# Patient Record
Sex: Female | Born: 1985 | Race: White | Hispanic: No | Marital: Married | Smoking: Former smoker
Health system: Southern US, Community
[De-identification: ages and names within clinical notes are randomized; demographics above are authoritative.]

## PROBLEM LIST (undated history)

## (undated) ENCOUNTER — Inpatient Hospital Stay (HOSPITAL_COMMUNITY): Payer: Self-pay

## (undated) DIAGNOSIS — O24419 Gestational diabetes mellitus in pregnancy, unspecified control: Secondary | ICD-10-CM

## (undated) DIAGNOSIS — D649 Anemia, unspecified: Secondary | ICD-10-CM

## (undated) DIAGNOSIS — F329 Major depressive disorder, single episode, unspecified: Secondary | ICD-10-CM

## (undated) DIAGNOSIS — F419 Anxiety disorder, unspecified: Secondary | ICD-10-CM

## (undated) DIAGNOSIS — Z87891 Personal history of nicotine dependence: Secondary | ICD-10-CM

## (undated) DIAGNOSIS — F1290 Cannabis use, unspecified, uncomplicated: Secondary | ICD-10-CM

## (undated) DIAGNOSIS — F32A Depression, unspecified: Secondary | ICD-10-CM

## (undated) HISTORY — DX: Anemia, unspecified: D64.9

## (undated) HISTORY — DX: Gestational diabetes mellitus in pregnancy, unspecified control: O24.419

## (undated) HISTORY — PX: OTHER SURGICAL HISTORY: SHX169

---
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
## 2007-06-16 ENCOUNTER — Emergency Department: Payer: Self-pay | Admitting: Emergency Medicine

## 2008-07-13 ENCOUNTER — Emergency Department (HOSPITAL_COMMUNITY): Admission: EM | Admit: 2008-07-13 | Discharge: 2008-07-13 | Payer: Self-pay | Admitting: Emergency Medicine

## 2009-02-13 HISTORY — PX: WISDOM TOOTH EXTRACTION: SHX21

## 2009-08-13 ENCOUNTER — Emergency Department: Payer: Self-pay | Admitting: Emergency Medicine

## 2011-06-29 ENCOUNTER — Encounter (HOSPITAL_COMMUNITY): Payer: Self-pay | Admitting: Emergency Medicine

## 2011-06-29 ENCOUNTER — Emergency Department (HOSPITAL_COMMUNITY)
Admission: EM | Admit: 2011-06-29 | Discharge: 2011-06-29 | Disposition: A | Discharge status: Home / Self Care | Payer: No Typology Code available for payment source | Attending: Emergency Medicine | Admitting: Emergency Medicine

## 2011-06-29 ENCOUNTER — Emergency Department (HOSPITAL_COMMUNITY): Payer: No Typology Code available for payment source

## 2011-06-29 DIAGNOSIS — R221 Localized swelling, mass and lump, neck: Secondary | ICD-10-CM | POA: Insufficient documentation

## 2011-06-29 DIAGNOSIS — R220 Localized swelling, mass and lump, head: Secondary | ICD-10-CM | POA: Insufficient documentation

## 2011-06-29 DIAGNOSIS — T07XXXA Unspecified multiple injuries, initial encounter: Secondary | ICD-10-CM | POA: Insufficient documentation

## 2011-06-29 DIAGNOSIS — M79609 Pain in unspecified limb: Secondary | ICD-10-CM | POA: Insufficient documentation

## 2011-06-29 DIAGNOSIS — F17200 Nicotine dependence, unspecified, uncomplicated: Secondary | ICD-10-CM | POA: Insufficient documentation

## 2011-06-29 DIAGNOSIS — IMO0002 Reserved for concepts with insufficient information to code with codable children: Secondary | ICD-10-CM | POA: Insufficient documentation

## 2011-06-29 DIAGNOSIS — M25559 Pain in unspecified hip: Secondary | ICD-10-CM | POA: Insufficient documentation

## 2011-06-29 DIAGNOSIS — S92309A Fracture of unspecified metatarsal bone(s), unspecified foot, initial encounter for closed fracture: Secondary | ICD-10-CM | POA: Insufficient documentation

## 2011-06-29 MED ORDER — OXYCODONE-ACETAMINOPHEN 5-325 MG PO TABS
2.0000 | ORAL_TABLET | Freq: Once | ORAL | Status: AC
  Administered 2011-06-29: 2 via ORAL
  Filled 2011-06-29: qty 2

## 2011-06-29 MED ORDER — OXYCODONE-ACETAMINOPHEN 5-325 MG PO TABS: 1.0000 | ORAL_TABLET | Freq: Four times a day (QID) | ORAL | Status: AC | PRN

## 2011-06-29 MED ORDER — BACITRACIN ZINC 500 UNIT/GM EX OINT
TOPICAL_OINTMENT | CUTANEOUS | Status: AC
  Filled 2011-06-29: qty 0.9

## 2011-06-29 NOTE — Discharge Instructions (Signed)
Assault, General Assault includes any behavior, whether intentional or reckless, which results in bodily injury to another person and/or damage to property. Included in this would be any behavior, intentional or reckless, that by its nature would be understood (interpreted) by a reasonable person as intent to harm another person or to damage his/her property. Threats may be oral or written. They may be communicated through regular mail, computer, fax, or phone. These threats may be direct or implied. FORMS OF ASSAULT INCLUDE:  Physically assaulting a person. This includes physical threats to inflict physical harm as well as:   Slapping.   Hitting.   Poking.   Kicking.   Punching.   Pushing.   Arson.   Sabotage.   Equipment vandalism.   Damaging or destroying property.   Throwing or hitting objects.   Displaying a weapon or an object that appears to be a weapon in a threatening manner.   Carrying a firearm of any kind.   Using a weapon to harm someone.   Using greater physical size/strength to intimidate another.   Making intimidating or threatening gestures.   Bullying.   Hazing.   Intimidating, threatening, hostile, or abusive language directed toward another person.   It communicates the intention to engage in violence against that person. And it leads a reasonable person to expect that violent behavior may occur.   Stalking another person.  IF IT HAPPENS AGAIN:  Immediately call for emergency help (911 in [place]   If someone poses clear and immediate danger to you, seek legal authorities to have a protective or restraining order put in place.   Less threatening assaults can at least be reported to authorities.  STEPS TO TAKE IF A SEXUAL ASSAULT HAS HAPPENED  Go to an area of safety. This may include a shelter or staying with a friend. Stay away from the area where you have been attacked. A large percentage of sexual assaults are caused by a friend, relative  or associate.   If medications were given by your caregiver, take them as directed for the full length of time prescribed.   Only take over-the-counter or prescription medicines for pain, discomfort, or fever as directed by your caregiver.   If you have come in contact with a sexual disease, find out if you are to be tested again. If your caregiver is concerned about the HIV/AIDS virus, he/she may require you to have continued testing for several months.   For the protection of your privacy, test results can not be given over the phone. Make sure you receive the results of your test. If your test results are not back during your visit, make an appointment with your caregiver to find out the results. Do not assume everything is normal if you have not heard from your caregiver or the medical facility. It is important for you to follow up on all of your test results.   File appropriate papers with authorities. This is important in all assaults, even if it has occurred in a family or by a friend.  SEEK MEDICAL CARE IF:  You have new problems because of your injuries.   You have problems that may be because of the medicine you are taking, such as:   Rash.   Itching.   Swelling.   Trouble breathing.   You develop belly (abdominal) pain, feel sick to your stomach (nausea) or are vomiting.   You begin to run a temperature.   You need supportive care or referral to  need supportive care or referral to a rape crisis center. These are centers with trained personnel who can help you get through this ordeal.  SEEK IMMEDIATE MEDICAL CARE IF:   You are afraid of being threatened, beaten, or abused. In [place] call 911.   You receive new injuries related to abuse.   You develop severe pain in any area injured in the assault or have any change in your condition that concerns you.   You faint or lose consciousness.   You develop chest pain or shortness of breath.  Document Released: 01/30/2005 Document Revised: 01/19/2011 Document Reviewed: 09/18/2007  [employer] Patient  Information 2012 [employer].
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
## 2011-06-30 NOTE — ED Provider Notes (Signed)
History/physical exam/procedure(s) were performed by non-physician practitioner and as supervising physician I was immediately available for consultation/collaboration. I have reviewed all notes and am in agreement with care and plan.   Raisha Brabender S Kyler Germer, MD 06/30/11 2316 

## 2013-06-04 ENCOUNTER — Emergency Department (HOSPITAL_COMMUNITY): Payer: No Typology Code available for payment source

## 2013-06-04 ENCOUNTER — Emergency Department (HOSPITAL_COMMUNITY)
Admission: EM | Admit: 2013-06-04 | Discharge: 2013-06-05 | Disposition: A | Discharge status: Home / Self Care | Payer: No Typology Code available for payment source | Attending: Emergency Medicine | Admitting: Emergency Medicine

## 2013-06-04 ENCOUNTER — Encounter (HOSPITAL_COMMUNITY): Payer: Self-pay | Admitting: Emergency Medicine

## 2013-06-04 DIAGNOSIS — R1310 Dysphagia, unspecified: Secondary | ICD-10-CM | POA: Insufficient documentation

## 2013-06-04 DIAGNOSIS — Z79899 Other long term (current) drug therapy: Secondary | ICD-10-CM | POA: Insufficient documentation

## 2013-06-04 DIAGNOSIS — F17200 Nicotine dependence, unspecified, uncomplicated: Secondary | ICD-10-CM | POA: Insufficient documentation

## 2013-06-04 NOTE — ED Notes (Signed)
Pt presents from home with c/o ?FB in esophagus, pt states she believes she swallowed chicken bone on 5 days a go. Pt c/o substernal pain. Pt is able to eat and drink.

## 2013-06-04 NOTE — ED Provider Notes (Signed)
CSN: [id number]     Arrival date & time 06/04/13  2209 History  This chart was scribed for non-physician practitioner Antony MaduraKelly Brandonn Capelli, PA-C working with Olivia Mackielga M Otter, MD by Joaquin MusicKristina Sanchez-Matthews, ED Scribe. This patient was seen in room WTR6/WTR6 and the patient's care was started at 11:48 PM .   Chief Complaint  Patient presents with  . Foreign Body   The history is provided by the patient. No language interpreter was used.   HPI Comments: Alexa White is a 28 y.o. female who presents to the Emergency Department complaining of chicken bone in esophagus she swallowed 5 days ago. Pt states she believes the bone  is small and thin but states she felt the bone going down her throat. She states she has been eating bread, rice, and drinking vinegar to "deteriorate the bone" to aid with her pain. Pt states she is having pain to swallow sporadically. Reports feeling the bone "coming up" when she bends forwards. Pt states in the morning, the bone is "at the top of her throat" and by the end of the day, the pain in mid-sternal. She reports being able to eat and drink fluids since she swallowed the bone. Denies having a PCP and taking OTC medications for her discomfort. Pt denies LOC, emesis, cough, sputum production, hemoptysis, inability to swallow, drooling, and fevers.   History reviewed. No pertinent past medical history. History reviewed. No pertinent past surgical history. No family history on file. History  Substance Use Topics  . Smoking status: Current Every Day Smoker  . Smokeless tobacco: Not on file  . Alcohol Use: Yes   OB History   Grav Para Term Preterm Abortions TAB SAB Ect Mult Living                 Review of Systems  Constitutional: Negative for fever.  HENT: Positive for trouble swallowing. Negative for drooling and sore throat.   Respiratory: Negative for cough and shortness of breath.   Gastrointestinal: Negative for vomiting and blood in stool.  Neurological: Negative  for syncope.  All other systems reviewed and are negative.  Allergies  Review of patient's allergies indicates no known allergies.  Home Medications   Prior to Admission medications   Medication Sig Start Date End Date Taking? Authorizing Provider  ibuprofen (ADVIL,MOTRIN) 200 MG tablet Take 400 mg by mouth every 6 (six) hours as needed (pain).    Yes Historical Provider, MD  norgestimate-ethinyl estradiol (ORTHO-CYCLEN,SPRINTEC,PREVIFEM) 0.25-35 MG-MCG tablet Take 1 tablet by mouth daily.   Yes Historical Provider, MD   BP 143/91  Pulse 76  Temp(Src) 97.8 F (36.6 C) (Oral)  Resp 16  Ht 5\' 4"  (1.626 m)  Wt 108 lb (48.988 kg)  BMI 18.53 kg/m2  SpO2 100%  LMP 05/13/2013  Physical Exam  Nursing note and vitals reviewed. Constitutional: She is oriented to person, place, and time. She appears well-developed and well-nourished. No distress.  Well and nontoxic appearing. Speaking in full sentences.  HENT:  Head: Normocephalic and atraumatic.  Mouth/Throat: Oropharynx is clear and moist. No oropharyngeal exudate.  Oropharynx clear. Uvula midline. No foreign body visualized. Patient tolerating secretions without difficulty or drooling.  Eyes: Conjunctivae and EOM are normal. Pupils are equal, round, and reactive to light. No scleral icterus.  Neck: Normal range of motion. Neck supple.  No stridor.  Cardiovascular: Normal rate, regular rhythm and normal heart sounds.   Pulmonary/Chest: Effort normal and breath sounds normal. No stridor. No respiratory distress. She has  no wheezes. She has no rales.  Chest expansion symmetric  Musculoskeletal: Normal range of motion.  Neurological: She is alert and oriented to person, place, and time.  Skin: Skin is warm and dry. No rash noted. She is not diaphoretic. No erythema. No pallor.  Psychiatric: She has a normal mood and affect. Her behavior is normal.    ED Course  Procedures DIAGNOSTIC STUDIES: Oxygen Saturation is 100% on RA, normal  by my interpretation.    COORDINATION OF CARE: 11:57 PM-Discussed treatment plan which includes speak with attending for further tx. Informed pt F/U with ENT may be necessary. Pt agreed to plan.  Labs Review Labs Reviewed - No data to display  Imaging Review Dg Chest 1 View  06/04/2013   CLINICAL DATA:  Chest pain. Patient ate a chicken bone 5 days ago and feels like it is still stuck.  EXAM: CHEST - 1 VIEW  COMPARISON:  None.  FINDINGS: The heart size and mediastinal contours are within normal limits. No mediastinal gas collections. No radiopaque foreign body. Both lungs are clear. The visualized skeletal structures are unremarkable.  IMPRESSION: No active disease.   Electronically Signed   By: Burman NievesWilliam  Stevens M.D.   On: 06/04/2013 22:34     EKG Interpretation None     MDM   Final diagnoses:  Dysphagia    28 year old presents for chief complaint of dysphasia. Patient states that symptoms have been waxing and waning sense of swallowing a chicken bone 5 days ago. Patient protecting her airway and speaking in full sentences. She denies inability to swallow food or fluids since the incident. She tolerates her secretions without difficulty or drooling today. Lungs clear to auscultation bilaterally. No stridor. No foreign body visualized on x-ray. Patient given GI cocktail in the ED. Will discharge with Pepcid and GI referral. Return precautions discussed and provided. Patient agreeable to plan with no unaddressed concerns.  I personally performed the services described in this documentation, which was scribed in my presence. The recorded information has been reviewed and is accurate.    Antony MaduraKelly Ranell Skibinski, PA-C 06/05/13 [phone]

## 2013-06-05 MED ORDER — FAMOTIDINE 20 MG PO TABS: 20.0000 mg | ORAL_TABLET | Freq: Two times a day (BID) | ORAL | Status: DC

## 2013-06-05 MED ORDER — GI COCKTAIL ~~LOC~~
30.0000 mL | Freq: Once | ORAL | Status: AC
  Administered 2013-06-05: 30 mL via ORAL
  Filled 2013-06-05: qty 30

## 2013-06-05 NOTE — ED Provider Notes (Signed)
Medical screening examination/treatment/procedure(s) were performed by non-physician practitioner and as supervising physician I was immediately available for consultation/collaboration.   EKG Interpretation None       Tonna Palazzi M Fumie Fiallo, MD 06/05/13 0559 

## 2013-06-05 NOTE — Discharge Instructions (Signed)
Dysphagia  Swallowing problems (dysphagia) occur when solids and liquids seem to stick in your throat on the way down to your stomach, or the food takes longer to get to the stomach. Other symptoms include regurgitating food, noises coming from the throat, chest discomfort with swallowing, and a feeling of fullness or the feeling of something being stuck in your throat when swallowing. When blockage in your throat is complete it may be associated with drooling.  CAUSES   Problems with swallowing may occur because of problems with the muscles. The food cannot be propelled in the usual manner into your stomach. You may have ulcers, scar tissue, or inflammation in the tube down which food travels from your mouth to your stomach (esophagus), which blocks food from passing normally into the stomach. Causes of inflammation include:  · Acid reflux from your stomach into your esophagus.  · Infection.  · Radiation treatment for cancer.  · Medicines taken without enough fluids to wash them down into your stomach.  You may have nerve problems that prevent signals from being sent to the muscles of your esophagus to contract and move your food down to your stomach. Globus pharyngeus is a relatively common problem in which there is a sense of an obstruction or difficulty in swallowing, without any physical abnormalities of the swallowing passages being found. This problem usually improves over time with reassurance and testing to rule out other causes.  DIAGNOSIS  Dysphagia can be diagnosed and its cause can be determined by tests in which you swallow a white substance that helps illuminate the inside of your throat (contrast medium) while X-rays are taken. Sometimes a flexible telescope that is inserted down your throat (endoscopy) to look at your esophagus and stomach is used.  TREATMENT   · If the dysphagia is caused by acid reflux or infection, medicines may be used.  · If the dysphagia is caused by problems with your  swallowing muscles, swallowing therapy may be used to help you strengthen your swallowing muscles.  · If the dysphagia is caused by a blockage or mass, procedures to remove the blockage may be done.  HOME CARE INSTRUCTIONS  · Try to eat soft food that is easier to swallow and check your weight on a daily basis to be sure that it is not decreasing.  · Be sure to drink liquids when sitting upright (not lying down).  SEEK MEDICAL CARE IF:  · You are losing weight because you are unable to swallow.  · You are coughing when you drink liquids (aspiration).  · You are coughing up partially digested food.  SEEK IMMEDIATE MEDICAL CARE IF:  · You are unable to swallow your own saliva .  · You are having shortness of breath or a fever, or both.  · You have a hoarse voice along with difficulty swallowing.  MAKE SURE YOU:  · Understand these instructions.  · Will watch your condition.  · Will get help right away if you are not doing well or get worse.  Document Released: 01/28/2000 Document Revised: 10/02/2012 Document Reviewed: 07/19/2012  [employer]® Patient Information ©2014 [employer].

## 2013-12-24 ENCOUNTER — Emergency Department (HOSPITAL_COMMUNITY)
Admission: EM | Admit: 2013-12-24 | Discharge: 2013-12-24 | Disposition: A | Discharge status: Home / Self Care | Payer: No Typology Code available for payment source | Attending: Emergency Medicine | Admitting: Emergency Medicine

## 2013-12-24 ENCOUNTER — Encounter (HOSPITAL_COMMUNITY): Payer: Self-pay | Admitting: *Deleted

## 2013-12-24 DIAGNOSIS — H9209 Otalgia, unspecified ear: Secondary | ICD-10-CM | POA: Insufficient documentation

## 2013-12-24 DIAGNOSIS — Z720 Tobacco use: Secondary | ICD-10-CM | POA: Insufficient documentation

## 2013-12-24 DIAGNOSIS — K088 Other specified disorders of teeth and supporting structures: Secondary | ICD-10-CM | POA: Insufficient documentation

## 2013-12-24 DIAGNOSIS — Z79899 Other long term (current) drug therapy: Secondary | ICD-10-CM | POA: Insufficient documentation

## 2013-12-24 DIAGNOSIS — K0889 Other specified disorders of teeth and supporting structures: Secondary | ICD-10-CM

## 2013-12-24 DIAGNOSIS — K029 Dental caries, unspecified: Secondary | ICD-10-CM | POA: Insufficient documentation

## 2013-12-24 MED ORDER — TRAMADOL HCL 50 MG PO TABS: 50.0000 mg | ORAL_TABLET | Freq: Four times a day (QID) | ORAL | Status: DC | PRN

## 2013-12-24 MED ORDER — NAPROXEN 500 MG PO TABS: 500.0000 mg | ORAL_TABLET | Freq: Two times a day (BID) | ORAL | Status: DC

## 2013-12-24 NOTE — ED Provider Notes (Signed)
CSN: [id number]     Arrival date & time 12/24/13  1652 History  This chart was scribed for Alexa CriglerJoshua Titus Drone, PA-C working with Mirian MoMatthew Gentry, MD by Evon Slackerrance Branch, ED Scribe. This patient was seen in room TR07C/TR07C and the patient's care was started at 4:59 PM.     Chief Complaint  Patient presents with  . Dental Pain   Patient is a 28 y.o. female presenting with tooth pain. The history is provided by the patient. No language interpreter was used.  Dental Pain Associated symptoms: no facial swelling, no fever, no headaches and no neck pain    HPI Comments: Alexa White is a 28 y.o. female who presents to the Emergency Department complaining of left lower side throbbing dental pain onset 6 months ago. She states that her pain recently worsened over the past month. She states that the pain intermittently shoots to her left ear. She states she has a chipped tooth that is gradually worsening as well. She states she has been taking ibuprofen and tylenol with no relief. She denies facial swelling.  History reviewed. No pertinent past medical history. History reviewed. No pertinent past surgical history. History reviewed. No pertinent family history. History  Substance Use Topics  . Smoking status: Current Every Day Smoker  . Smokeless tobacco: Not on file  . Alcohol Use: Yes   OB History    No data available     Review of Systems  Constitutional: Negative for fever.  HENT: Positive for dental problem and ear pain. Negative for facial swelling, sore throat and trouble swallowing.   Respiratory: Negative for shortness of breath and stridor.   Musculoskeletal: Negative for neck pain.  Skin: Negative for color change.  Neurological: Negative for headaches.    Allergies  Review of patient's allergies indicates no known allergies.  Home Medications   Prior to Admission medications   Medication Sig Start Date End Date Taking? Authorizing Provider  famotidine (PEPCID) 20 MG tablet  Take 1 tablet (20 mg total) by mouth 2 (two) times daily. 06/05/13   Antony MaduraKelly Humes, PA-C  ibuprofen (ADVIL,MOTRIN) 200 MG tablet Take 400 mg by mouth every 6 (six) hours as needed (pain).     Historical Provider, MD  norgestimate-ethinyl estradiol (ORTHO-CYCLEN,SPRINTEC,PREVIFEM) 0.25-35 MG-MCG tablet Take 1 tablet by mouth daily.    Historical Provider, MD   Triage Vitals: BP 143/81 mmHg  Pulse 91  Temp(Src) 98.6 F (37 C) (Oral)  Resp 20  SpO2 99%  LMP 12/09/2013  Physical Exam  Constitutional: She is oriented to person, place, and time. She appears well-developed and well-nourished. No distress.  HENT:  Head: Normocephalic and atraumatic.  Right Ear: Tympanic membrane, external ear and ear canal normal.  Left Ear: Tympanic membrane, external ear and ear canal normal.  Nose: Nose normal.  Mouth/Throat: Uvula is midline, oropharynx is clear and moist and mucous membranes are normal. No trismus in the jaw. Abnormal dentition. Dental caries present. No dental abscesses or uvula swelling. No tonsillar abscesses.  Patient with L mandibular tooth pain and tenderness to palpation in area of 1st molar. There is a hole in the tooth. No swelling or erythema noted on exam. No abscess noted.   Eyes: Conjunctivae and EOM are normal.  Neck: Normal range of motion. Neck supple. No tracheal deviation present.  No neck swelling or Ludwig's angina  Cardiovascular: Normal rate.   Pulmonary/Chest: Effort normal. No respiratory distress.  Musculoskeletal: Normal range of motion.  Lymphadenopathy:    She has no  cervical adenopathy.  Neurological: She is alert and oriented to person, place, and time.  Skin: Skin is warm and dry.  Psychiatric: She has a normal mood and affect. Her behavior is normal.  Nursing note and vitals reviewed.   ED Course  Procedures (including critical care time) DIAGNOSTIC STUDIES: Oxygen Saturation is 99% on RA, normal by my interpretation.    COORDINATION OF CARE: 5:03  PM-Discussed treatment plan which includes anti-inflammatories and pain medication with pt at bedside and pt agreed to plan.     Labs Review Labs Reviewed - No data to display  Imaging Review No results found.   EKG Interpretation None      5:11 PM Patient seen and examined.   Vital signs reviewed and are as follows: BP 107/87 mmHg  Pulse 83  Temp(Src) 98.2 F (36.8 C) (Oral)  Resp 14  SpO2 100%  LMP 12/09/2013  Patient counseled on use of narcotic pain medications. Counseled not to combine these medications with others containing tylenol. Urged not to drink alcohol, drive, or perform any other activities that requires focus while taking these medications. The patient verbalizes understanding and agrees with the plan.  Patient counseled to take prescribed medications as directed, return with worsening facial or neck swelling, and to follow-up with their dentist as soon as possible.   PCP/dental referrals given.   MDM   Final diagnoses:  Pain, dental   Patient with toothache. No fever. Exam unconcerning for Ludwig's angina or other deep tissue infection in neck.   As there is no facial swelling or gum findings, will not prescribe antibiotics at this time. Will treat with pain medication.      I personally performed the services described in this documentation, which was scribed in my presence. The recorded information has been reviewed and is accurate.     Alexa CriglerJoshua Anikin Prosser, PA-C 12/24/13 1711  Mirian MoMatthew Gentry, MD 12/25/13 2106

## 2013-12-24 NOTE — Discharge Instructions (Signed)
Please read and follow all provided instructions.  Your diagnoses today include:  1. Pain, dental    The exam and treatment you received today has been provided on an emergency basis only. This is not a substitute for complete medical or dental care.  Tests performed today include:  Vital signs. See below for your results today.   Medications prescribed:   Tramadol - narcotic-like pain medication  DO NOT drive or perform any activities that require you to be awake and alert because this medicine can make you drowsy.    Naproxen - anti-inflammatory pain medication  Do not exceed 500mg  naproxen every 12 hours, take with food  You have been prescribed an anti-inflammatory medication or NSAID. Take with food. Take smallest effective dose for the shortest duration needed for your pain. Stop taking if you experience stomach pain or vomiting.   Take any prescribed medications only as directed.  Home care instructions:  Follow any educational materials contained in this packet.  Follow-up instructions: Please follow-up with your dentist for further evaluation of your symptoms.   Dental Assistance: See below for dental referrals  Return instructions:   Please return to the Emergency Department if you experience worsening symptoms.  Please return if you develop a fever, you develop more swelling in your face or neck, you have trouble breathing or swallowing food.  Please return if you have any other emergent concerns.  Additional Information:  Your vital signs today were: BP 143/81 mmHg   Pulse 91   Temp(Src) 98.6 F (37 C) (Oral)   Resp 20   SpO2 99%   LMP 12/09/2013 If your blood pressure (BP) was elevated above 135/85 this visit, please have this repeated by your doctor within one month. -------------- Dental Care: Organization         Address  Phone  Notes  [facility] Department of [facility] [address] [employer] [place], [place] [phone]  Accepts children up to age 5 who are enrolled in [place] or [place] Health Choice; pregnant women with a Medicaid card; and children who have applied for Medicaid or [place] Health Choice, but were declined, whose parents can pay a reduced fee at time of service.  [facility] Department of [facility]  [address], [place] [phone] Accepts children up to age 74 who are enrolled in [place] or [place] Health Choice; pregnant women with a Medicaid card; and children who have applied for Medicaid or [place] Health Choice, but were declined, whose parents can pay a reduced fee at time of service.  [facility]  [address] [place], [place] [phone] Patients are seen by appointment only. Walk-ins are not accepted. [facility] will see patients 50 years of age and older. Monday - Tuesday (8am-5pm) Most Wednesdays (8:30-5pm) $30 per visit, cash only  [facility] Adult Dental Access PRO[facility]  [address], [facility] [phone] Patients are seen by appointment only. Walk-ins are not accepted. [facility] will see patients 68 years of age and older. One Wednesday Evening (Monthly: Volunteer Based).  $30 per visit, cash only  [employer]  [phone] for adults; Children under age 74, call Graduate Pediatric Dentistry at [phone]. Children aged 81-14, please call [phone] to request a pediatric application.  Dental services are provided in all areas of dental care including fillings, crowns and bridges, complete and partial dentures, implants, gum treatment, root canals, and extractions.  Preventive care is also provided. Treatment is provided to both adults and children. Patients are selected via a lottery and there is often a waiting list.   [facility] [address], [place]  [phone] [email or web address]   [facility] [address], [place], [place] [phone], Ext. [phone] Second  and Fourth Thursday of each month, opens at 6:30 AM; Clinic ends at 9 AM.  Patients are seen on a first-come first-served basis, and a limited number are seen during each clinic.   [facility]  [address], [place], [place] [phone]) [phone]   Eligibility Requirements You must have lived in [place], [place], or [place] counties for at least the last three months.   You cannot be eligible for state or federal sponsored [place]healthcare insurance, including CIG[employer]eterans Administration, [place]icaid, or [employer]icare.   You generally cannot be eligible for healthcare insurance through your employer.    How to apply: Eligibility screenings are held every Tuesday and Wednesday afternoon from 1:00 pm until 4:00 pm. You do not need an appointment for the interview!  [facility] [address], [place], [place] [phone]   [facility]  [phone]   [facility]  [phone]   [facility]  [phone]     Emergency Department Resource Guide 1) Find a Doctor and Pay Out of Pocket Although you won't have to find out who is covered by your insurance plan, it is a good idea to ask around and get recommendations. You will then need to call the office and see if the doctor you have chosen will accept you as a new patient and what types of options they offer for patients who are self-pay. Some doctors offer discounts or will set up payment plans for their patients who do not have insurance, but you will need to ask so you aren't surprised when you get to your appointment.  2) Contact Your Local Health Department Not all health departments have doctors that can see patients for sick visits, but many do, so it is worth a call to see if yours does. If you don't know where your local health department is, you can check in your phone book. The CDC also has a tool to help you locate your state's health department, and many  state websites also have listings of all of their local health departments.  3) Find a Walk-in Clinic If your illness is not likely to be very severe or complicated, you may want to try a walk in clinic. These are popping up all over the country in pharmacies, drugstores, and shopping centers. They're usually staffed by nurse practitioners or physician assistants that have been trained to treat common illnesses and complaints. They're usually fairly quick and inexpensive. However, if you have serious medical issues or chronic medical problems, these are probably not your best option.  No Primary Care Doctor: - Call Health Connect at  [phone] - they can help you locate a primary care doctor that  accepts your insurance, provides certain services, etc. - Physician Referral Service- [phone]  Chronic Pain Problems: Organization         Address  Phone   Notes  [facility]  [phone]) [phone] Patients need to be referred by their primary care doctor.   Medication Assistance: Organization         Address  Phone   Notes  [facility] [address] [place], [place] [address] [phone] --Must be a resident of [place] [facility] [facility] -- Must have NO insurance coverage whatsoever (no Medicaid/ Medicare, etc.) -- The pt. MUST have a primary care doctor that directs their care regularly and follows them in the community   MedAssist  [phone]   [employer]  [phone]    Agencies that provide inexpensive medical care: Organization         Address  Phone   Notes  [facility]  [phone]   [facility]    [phone]   [facility] [address] [place], [place] [address] [phone]) [phone]   [facility] [address], [place] [phone]   Planned Parenthood    [phone]   [facility]    [phone](336) [phone]   Community Health and  [facility]  [address], [place] Phone:  [phone](336) [phone], Fax:  [phone](336) [phone] Hours of Operation:  9 am - 6 pm, M-F.  Also accepts Medicaid/Medicare and self-pay.  [facility]  [address], [place] Phone: [phone], Fax: [phone]. Hours of Operation:  8:30 am - 5:30 pm, M-F.  Also accepts Medicaid and self-pay.  [facility] [place] [address], [place] Phone: [phone]) [phone]   [facility] [address], [place], [place] [phone], Ext. [phone] Mondays & Thursdays: 7-9 AM.  First 15 patients are seen on a first come, first serve basis.    Medicaid-accepting [facility] Providers:  Organization         Address  Phone   Notes  [facility] [address], [place] [phone]) [phone] Also accepts self-pay patients.  [facility] [address], [place]  [phone]) [phone]   [facility] [address], [place] [phone]   [facility] [address] [place] [address], [place] [phone]) [phone]   Renaye RakersVeita Bland [address], [place]   [phone] Only accepts [place] Access [place]icaid patients after they have their name applied to their card.   Self-Pay (no insurance) in [facility]:  Organization         Address  Phone   Notes  Sickle Cell Patients, [facility] [address], [place] [phone]   [facility] [address], [place] [phone]   [facility] [place]  [address], [place] [phone]   [facility]/Dr. Osei-Bonsu  [address] [place] [address], [place] or [phone], [place] [phone] Phone number for both [place] and [place] locations is the same.  [facility] [address], [place] [phone](336) [phone]   [facility] [address]  [place] [address], [place] or [address] [phone]   [facility] [address], [place] [phone]) [phone], phone; [phone]) [phone], fax Sees patients 1st and 3rd Saturday of every month.  Must not qualify for public or private insurance (i.e. Medicaid, Medicare, [place] Health Choice, Veterans' Benefits)  Household income should be no more than 200% of the poverty level The clinic cannot treat you if you are pregnant or think you are pregnant  Sexually transmitted diseases are not treated at the clinic.  Dental Care: Organization         Address  Phone  Notes  [facility] Department of [facility] [phone] Accepts children up to age 105 who are enrolled in [place] or [place]; pregnant women with a Medicaid card; and children who have applied for Medicaid or [place] Health Choice, but were declined, whose parents can pay a reduced fee at time of service.  [facility] Department of [facility]  [address], [place] [phone] Accepts children up to age 67 who are enrolled in [place] or [place]; pregnant women with a Medicaid card; and children who have applied for Medicaid or [place] Health Choice, but were declined, whose parents can pay a reduced fee at time of service.  [place] [facility]  [place] [phone] Patients are seen by appointment only. Walk-ins are not accepted. [place] will see patients 53 years of age and older. Monday - Tuesday (8am-5pm) Most Wednesdays (8:30-5pm) $30 per visit, cash only  [facility] Access [facility]  [address], [facility] [phone] Patients are seen by appointment only. Walk-ins are not accepted. [place] will see patients 77 years of age and older. One Wednesday Evening (Monthly: Volunteer Based).  $30 per visit, cash only  [place]  [phone] for adults; Children under age 101, call Graduate Pediatric Dentistry at [phone]. Children aged 28-14, please call [phone] to request a pediatric application.  Dental services are provided in all areas of dental care including fillings, crowns and bridges, complete and partial dentures, implants, gum treatment, root canals, and extractions. Preventive care is also provided. Treatment is provided to both adults and children. Patients are selected via a lottery and there is often a waiting list.   [facility] [address], [place]  [phone] [email or web address]   [facility] [address] [place], [place] [phone], Ext. [phone] Second and Fourth Thursday of each month, opens at 6:30 AM; Clinic ends at 9 AM.  Patients are seen on a first-come first-served basis, and a limited number are seen during each clinic.   [facility]  [address] [place] [address] Hillard Danker [place], [place] [phone]   Eligibility Requirements You must have lived in [place], [place], or [place] counties for at least the last three months.   You cannot be eligible for state or federal sponsored [employer], including [employer], [place], or [employer].   You generally cannot be eligible for healthcare insurance through your employer.    How to apply: Eligibility screenings are held every Tuesday and Wednesday afternoon from 1:00 pm until 4:00 pm. You do not need an appointment for the interview!  [facility] [address], [place], [place]   [place] Department  [name]  [phone]    Behavioral Health Resources in the Community: Intensive Outpatient Programs Organization         Address  Phone  Notes  [place] [address] [place], [place]  [phone]   [facility] [address], [place], [place]   [facility]: [facility] [address], [place], [place]   [place] [address],  [place], [place] [phone] or [phone]   Substance Abuse Resources Organization         Address  Phone  Notes  [facility] Drug Services  [phone]   [facility]  [phone]   [facility]  [phone]   Floydene Flock  [phone]   Residential & Outpatient Substance Abuse Program  [phone]   Psychological Services Organization         Address  Phone  Notes  [facility]  [phone]   [facility]  [phone]   [facility] [address], [place] [phone] or [phone]    Mobile Crisis Teams Organization         Address  Phone  Notes  [facility], Mobile Crisis Care Unit  [phone]   [facility]  [address] [place], [place] [phone]   Doristine Locks [address] [place] [phone]    Self-Help/Support Groups Organization         Address  Phone             Notes  [facility]ental Health Assoc. of [place] - variety of support groups  [phone]- I7437963 Call for more information  Narcotics Anonymous (NA), [facility] [address], [employer] [place]  2 meetings at this location   Statistician         Address  Phone  Notes  [facility] [address],    [place]  [phone]   [facility]  [address] [place] [address], [place], [place] [phone]   [facility] [address] [place], [place] [phone] Admissions: 8am-3pm M-F  [facility] [address]    [place], [place] [phone]   [facility] [address] [place], [place], [place] [phone]   [facility] [address]    [place], [place] [phone]   [facility] - Intensive Outpatient [address], [place], [place] [phone]   [facility] ([facility]iction Recovery [facility] [address] [place].,  [place], [place] [phone] or [phone]   [facility] (RTS) [address], [place], [place] [phone] Accepts Medicaid  [facility] [place] [address],  [place] [phone] Substance Abuse/Addiction Treatment   [facility] Organization         Address  Phone  Notes  [facility]  [phone]   Angie Fava, PhD [address] Kn[address] [place], [place]   [phone] or [phone]   [facility]   [address] [place], [place] [phone]   [facility] [address], [place], [place] [phone] Insurance/Medicaid/sponsorship through [facility] and Families [address]                                    [place], [place] [phone] Therapy/tele-psych/case  [facility] [address], [place] [phone]    Dr. Lolly Mustache  [phone]   [facility]  [employer] [facility] Dept. 1) [address] [place] 2) [address], [place] 3)  [address], [place] [phone]  [phone]   [facility] Child Abuse Hotline [phone]) [phone] or [phone] (After Hours)

## 2013-12-24 NOTE — ED Notes (Signed)
Pt reports broken tooth and pain to left lower side. Airway intact.

## 2014-02-13 DIAGNOSIS — F1290 Cannabis use, unspecified, uncomplicated: Secondary | ICD-10-CM

## 2014-02-13 HISTORY — DX: Cannabis use, unspecified, uncomplicated: F12.90

## 2014-02-13 NOTE — L&D Delivery Note (Cosign Needed)
Patient is 29 y.o. [id number] 1748w2d admitted after PROM and was induced with pitocin, hx of marijuana use.    Delivery Note At 8:16 PM a viable and healthy female was delivered via Vaginal, Spontaneous Delivery (Presentation: ; Occiput Anterior).  APGAR: , ; weight pending  .   Placenta status: Intact, Spontaneous.  Cord: 3 vessels with the following complications: None.    Anesthesia: Epidural  Episiotomy: None Lacerations:  1st degree perineal, no sutures Est. Blood Loss (mL):  100 cc  Mom to postpartum.  Baby to Couplet care / Skin to Skin.  Olivia Cantermanda Schultz 01/22/2015, 8:34 PM    Patient is a G3P1011 at 7948w2d who was admitted w/ PROM, uncomplicated prenatal course.  She progressed with augmentation via Pitocin.  Wynelle BourgeoisMarie Williams CNM was gloved and present for delivery in its entirety; this CNM present as well.  Second stage of labor progressed to SVD  Complications: none  Lacerations: see above    Cam HaiSHAW, KIMBERLY, CNM 12:30 AM  01/23/2015

## 2014-06-18 LAB — OB RESULTS CONSOLE HIV ANTIBODY (ROUTINE TESTING): HIV: NONREACTIVE

## 2014-06-19 LAB — OB RESULTS CONSOLE HGB/HCT, BLOOD: Hemoglobin: 10.6 g/dL

## 2014-06-19 LAB — OB RESULTS CONSOLE ANTIBODY SCREEN: Antibody Screen: NEGATIVE

## 2014-06-19 LAB — OB RESULTS CONSOLE ABO/RH: RH Type: POSITIVE

## 2014-06-19 LAB — OB RESULTS CONSOLE GC/CHLAMYDIA
CHLAMYDIA, DNA PROBE: NEGATIVE
Gonorrhea: NEGATIVE

## 2014-06-19 LAB — OB RESULTS CONSOLE RUBELLA ANTIBODY, IGM: RUBELLA: IMMUNE

## 2014-06-19 LAB — OB RESULTS CONSOLE RPR: RPR: NONREACTIVE

## 2014-06-19 LAB — OB RESULTS CONSOLE HEPATITIS B SURFACE ANTIGEN: Hepatitis B Surface Ag: NEGATIVE

## 2014-07-01 ENCOUNTER — Inpatient Hospital Stay (HOSPITAL_COMMUNITY)
Admission: AD | Admit: 2014-07-01 | Discharge: 2014-07-01 | Disposition: A | Discharge status: Home / Self Care | Payer: Medicaid Other | Source: Ambulatory Visit | Attending: Obstetrics & Gynecology | Admitting: Obstetrics & Gynecology

## 2014-07-01 ENCOUNTER — Encounter (HOSPITAL_COMMUNITY): Payer: Self-pay

## 2014-07-01 DIAGNOSIS — O210 Mild hyperemesis gravidarum: Secondary | ICD-10-CM | POA: Insufficient documentation

## 2014-07-01 DIAGNOSIS — B379 Candidiasis, unspecified: Secondary | ICD-10-CM

## 2014-07-01 DIAGNOSIS — B373 Candidiasis of vulva and vagina: Secondary | ICD-10-CM | POA: Diagnosis not present

## 2014-07-01 DIAGNOSIS — Z3A11 11 weeks gestation of pregnancy: Secondary | ICD-10-CM | POA: Diagnosis not present

## 2014-07-01 DIAGNOSIS — O98811 Other maternal infectious and parasitic diseases complicating pregnancy, first trimester: Secondary | ICD-10-CM | POA: Diagnosis not present

## 2014-07-01 DIAGNOSIS — Z87891 Personal history of nicotine dependence: Secondary | ICD-10-CM | POA: Diagnosis not present

## 2014-07-01 DIAGNOSIS — O219 Vomiting of pregnancy, unspecified: Secondary | ICD-10-CM

## 2014-07-01 LAB — URINALYSIS, ROUTINE W REFLEX MICROSCOPIC
BILIRUBIN URINE: NEGATIVE
GLUCOSE, UA: NEGATIVE mg/dL
KETONES UR: NEGATIVE mg/dL
Leukocytes, UA: NEGATIVE
Nitrite: NEGATIVE
PROTEIN: NEGATIVE mg/dL
Specific Gravity, Urine: >1.03 — ABNORMAL HIGH (ref 1.005–1.030)
Urobilinogen, UA: 1 mg/dL (ref 0.0–1.0)
pH: 5.5 (ref 5.0–8.0)

## 2014-07-01 LAB — URINE MICROSCOPIC-ADD ON

## 2014-07-01 LAB — WET PREP, GENITAL
Clue Cells Wet Prep HPF POC: NONE SEEN
Trich, Wet Prep: NONE SEEN

## 2014-07-01 LAB — POCT PREGNANCY, URINE: Preg Test, Ur: POSITIVE — AB

## 2014-07-01 MED ORDER — ONDANSETRON 8 MG PO TBDP
8.0000 mg | ORAL_TABLET | Freq: Once | ORAL | Status: AC
  Administered 2014-07-01: 8 mg via ORAL
  Filled 2014-07-01: qty 1

## 2014-07-01 MED ORDER — ONDANSETRON 8 MG PO TBDP: 8.0000 mg | ORAL_TABLET | Freq: Three times a day (TID) | ORAL | Status: DC | PRN

## 2014-07-01 MED ORDER — TERCONAZOLE 0.4 % VA CREA: 1.0000 | TOPICAL_CREAM | Freq: Every day | VAGINAL | Status: DC

## 2014-07-01 NOTE — MAU Provider Note (Signed)
History     CSN: [id number]  Arrival date and time: 07/01/14 2140   First Provider Initiated Contact with Patient 07/01/14 2244      Chief Complaint  Patient presents with  . Emesis During Pregnancy  . Vaginal Discharge  . Shortness of Breath   HPI Comments: Alexa White is a 29 y.o. G2P1001 at 11 weeks 0 days by LMP who presents today with nausea and vomiting. She has been taking phenergan, but states that in the last 24 hours she has not been able to keep anything down. She denies any abdominal pain or vaginal bleeding. She has had some vaginal discharge.   Vaginal Discharge The patient's primary symptoms include vaginal discharge. This is a new problem. The current episode started in the past 7 days. The problem has been gradually worsening. The patient is experiencing no pain. She is pregnant. Associated symptoms include nausea and vomiting. Pertinent negatives include no abdominal pain, constipation, diarrhea, dysuria, fever, frequency or urgency. The vaginal discharge was thick and white. She has not been passing clots. She has not been passing tissue.  Emesis  This is a recurrent problem. The current episode started 1 to 4 weeks ago. The problem occurs more than 10 times per day. The problem has been waxing and waning. The emesis has an appearance of stomach contents. There has been no fever. Pertinent negatives include no abdominal pain, diarrhea or fever. Risk factors: pregnancy  Treatments tried: phenergan     History reviewed. No pertinent past medical history.  History reviewed. No pertinent past surgical history.  History reviewed. No pertinent family history.  History  Substance Use Topics  . Smoking status: Former Smoker    Quit date: 06/01/2014  . Smokeless tobacco: Not on file  . Alcohol Use: No    Allergies: No Known Allergies  Prescriptions prior to admission  Medication Sig Dispense Refill Last Dose  . ibuprofen (ADVIL,MOTRIN) 200 MG tablet Take 400 mg  by mouth every 6 (six) hours as needed for headache (pain).    Past Week at Unknown time  . Prenatal Vit-Fe Fumarate-FA (PRENATAL MULTIVITAMIN) TABS tablet Take 1 tablet by mouth at bedtime.   Past Week at Unknown time  . promethazine (PHENERGAN) 25 MG tablet Take 12.5 mg by mouth 2 (two) times daily as needed for nausea or vomiting.   07/01/2014 at Unknown time  . famotidine (PEPCID) 20 MG tablet Take 1 tablet (20 mg total) by mouth 2 (two) times daily. (Patient not taking: Reported on 07/01/2014) 30 tablet 0 Not Taking at Unknown time  . naproxen (NAPROSYN) 500 MG tablet Take 1 tablet (500 mg total) by mouth 2 (two) times daily. (Patient not taking: Reported on 07/01/2014) 20 tablet 0 Not Taking at Unknown time  . traMADol (ULTRAM) 50 MG tablet Take 1 tablet (50 mg total) by mouth every 6 (six) hours as needed. (Patient not taking: Reported on 07/01/2014) 15 tablet 0 Not Taking at Unknown time    Review of Systems  Constitutional: Negative for fever.  Gastrointestinal: Positive for nausea and vomiting. Negative for abdominal pain, diarrhea and constipation.  Genitourinary: Positive for vaginal discharge. Negative for dysuria, urgency and frequency.   Physical Exam   Blood pressure 121/83, pulse 88, temperature 98.3 F (36.8 C), temperature source Oral, resp. rate 20, height 5' 3.5" (1.613 m), weight 47.537 kg (104 lb 12.8 oz).  Physical Exam  Nursing note and vitals reviewed. Constitutional: She is oriented to person, place, and time. She appears well-developed  and well-nourished. No distress.  Cardiovascular: Normal rate.   Respiratory: Effort normal.  GI: Soft. There is no tenderness. There is no rebound.  Neurological: She is alert and oriented to person, place, and time.  Skin: Skin is warm and dry.  Psychiatric: She has a normal mood and affect.  FHT 165 with doppler   Results for orders placed or performed during the hospital encounter of 07/01/14 (from the past 24 hour(s))   Urinalysis, Routine w reflex microscopic     Status: Abnormal   Collection Time: 07/01/14  9:45 PM  Result Value Ref Range   Color, Urine YELLOW YELLOW   APPearance CLEAR CLEAR   Specific Gravity, Urine >1.030 (H) 1.005 - 1.030   pH 5.5 5.0 - 8.0   Glucose, UA NEGATIVE NEGATIVE mg/dL   Hgb urine dipstick TRACE (A) NEGATIVE   Bilirubin Urine NEGATIVE NEGATIVE   Ketones, ur NEGATIVE NEGATIVE mg/dL   Protein, ur NEGATIVE NEGATIVE mg/dL   Urobilinogen, UA 1.0 0.0 - 1.0 mg/dL   Nitrite NEGATIVE NEGATIVE   Leukocytes, UA NEGATIVE NEGATIVE  Urine microscopic-add on     Status: Abnormal   Collection Time: 07/01/14  9:45 PM  Result Value Ref Range   Squamous Epithelial / LPF FEW (A) RARE   WBC, UA 0-2 <3 WBC/hpf   RBC / HPF 0-2 <3 RBC/hpf   Bacteria, UA RARE RARE   Urine-Other YEAST   Pregnancy, urine POC     Status: Abnormal   Collection Time: 07/01/14  9:59 PM  Result Value Ref Range   Preg Test, Ur POSITIVE (A) NEGATIVE  Wet prep, genital     Status: Abnormal   Collection Time: 07/01/14 10:15 PM  Result Value Ref Range   Yeast Wet Prep HPF POC MODERATE (A) NONE SEEN   Trich, Wet Prep NONE SEEN NONE SEEN   Clue Cells Wet Prep HPF POC NONE SEEN NONE SEEN   WBC, Wet Prep HPF POC FEW (A) NONE SEEN     MAU Course  Procedures  MDM   Assessment and Plan   1. Nausea/vomiting in pregnancy   2. Yeast infection    DC home RX zofran and terazol Return to MAU as needed  Follow-up Information    Follow up with [facility].   Why:  As scheduled   Contact information:   [address] [place] [id number] [phone]        Tawnya CrookHogan, Deanndra Kirley Donovan 07/01/2014, 10:58 PM

## 2014-07-01 NOTE — MAU Note (Signed)
Seen for a few visits at [facility]. Unable to keep phenergan or food down since this morning.  Denies vaginal bleeding. Vaginal discharge & irritation today. Lower abdominal pain; worse with movement and coughing.  Dizzy & SOB when moving and repositioning.

## 2014-07-01 NOTE — Discharge Instructions (Signed)
Morning Sickness Morning sickness is when you feel sick to your stomach (nauseous) during pregnancy. This nauseous feeling may or may not come with vomiting. It often occurs in the morning but can be a problem any time of day. Morning sickness is most common during the first trimester, but it may continue throughout pregnancy. While morning sickness is unpleasant, it is usually harmless unless you develop severe and continual vomiting (hyperemesis gravidarum). This condition requires more intense treatment.  CAUSES  The cause of morning sickness is not completely known but seems to be related to normal hormonal changes that occur in pregnancy. RISK FACTORS You are at greater risk if you:  Experienced nausea or vomiting before your pregnancy.  Had morning sickness during a previous pregnancy.  Are pregnant with more than one baby, such as twins. TREATMENT  Do not use any medicines (prescription, over-the-counter, or herbal) for morning sickness without first talking to your health care provider. Your health care provider may prescribe or recommend:  Vitamin B6 supplements.  Anti-nausea medicines.  The herbal medicine ginger. HOME CARE INSTRUCTIONS   Only take over-the-counter or prescription medicines as directed by your health care provider.  Taking multivitamins before getting pregnant can prevent or decrease the severity of morning sickness in most women.  Eat a piece of dry toast or unsalted crackers before getting out of bed in the morning.  Eat five or six small meals a day.  Eat dry and bland foods (rice, baked potato). Foods high in carbohydrates are often helpful.  Do not drink liquids with your meals. Drink liquids between meals.  Avoid greasy, fatty, and spicy foods.  Get someone to cook for you if the smell of any food causes nausea and vomiting.  If you feel nauseous after taking prenatal vitamins, take the vitamins at night or with a snack.  Snack on protein  foods (nuts, yogurt, cheese) between meals if you are hungry.  Eat unsweetened gelatins for desserts.  Wearing an acupressure wristband (worn for sea sickness) may be helpful.  Acupuncture may be helpful.  Do not smoke.  Get a humidifier to keep the air in your house free of odors.  Get plenty of fresh air. SEEK MEDICAL CARE IF:   Your home remedies are not working, and you need medicine.  You feel dizzy or lightheaded.  You are losing weight. SEEK IMMEDIATE MEDICAL CARE IF:   You have persistent and uncontrolled nausea and vomiting.  You pass out (faint). MAKE SURE YOU:  Understand these instructions.  Will watch your condition.  Will get help right away if you are not doing well or get worse. Document Released: 03/23/2006 Document Revised: 02/04/2013 Document Reviewed: 07/17/2012 [facility] Patient Information 2015 [place], [place]. This information is not intended to replace advice given to you by your health care provider. Make sure you discuss any questions you have with your health care provider.  Candida Infection A Candida infection (also called yeast, fungus, and Monilia infection) is an overgrowth of yeast that can occur anywhere on the body. A yeast infection commonly occurs in warm, moist body areas. Usually, the infection remains localized but can spread to become a systemic infection. A yeast infection may be a sign of a more severe disease such as diabetes, leukemia, or AIDS. A yeast infection can occur in both men and women. In women, Candida vaginitis is a vaginal infection. It is one of the most common causes of vaginitis. Men usually do not have symptoms or know they have an  infection until other problems develop. Men may find out they have a yeast infection because their sex partner has a yeast infection. Uncircumcised men are more likely to get a yeast infection than circumcised men. This is because the uncircumcised glans is not exposed to air and does not  remain as dry as that of a circumcised glans. Older adults may develop yeast infections around dentures. CAUSES  Women  Antibiotics.  Steroid medication taken for a long time.  Being overweight (obese).  Diabetes.  Poor immune condition.  Certain serious medical conditions.  Immune suppressive medications for organ transplant patients.  Chemotherapy.  Pregnancy.  Menstruation.  Stress and fatigue.  Intravenous drug use.  Oral contraceptives.  Wearing tight-fitting clothes in the crotch area.  Catching it from a sex partner who has a yeast infection.  Spermicide.  Intravenous, urinary, or other catheters. Men  Catching it from a sex partner who has a yeast infection.  Having oral or anal sex with a person who has the infection.  Spermicide.  Diabetes.  Antibiotics.  Poor immune system.  Medications that suppress the immune system.  Intravenous drug use.  Intravenous, urinary, or other catheters. SYMPTOMS  Women  Thick, white vaginal discharge.  Vaginal itching.  Redness and swelling in and around the vagina.  Irritation of the lips of the vagina and perineum.  Blisters on the vaginal lips and perineum.  Painful sexual intercourse.  Low blood sugar (hypoglycemia).  Painful urination.  Bladder infections.  Intestinal problems such as constipation, indigestion, bad breath, bloating, increase in gas, diarrhea, or loose stools. Men  Men may develop intestinal problems such as constipation, indigestion, bad breath, bloating, increase in gas, diarrhea, or loose stools.  Dry, cracked skin on the penis with itching or discomfort.  Jock itch.  Dry, flaky skin.  Athlete's foot.  Hypoglycemia. DIAGNOSIS  Women  A history and an exam are performed.  The discharge may be examined under a microscope.  A culture may be taken of the discharge. Men  A history and an exam are performed.  Any discharge from the penis or areas of  cracked skin will be looked at under the microscope and cultured.  Stool samples may be cultured. TREATMENT  Women  Vaginal antifungal suppositories and creams.  Medicated creams to decrease irritation and itching on the outside of the vagina.  Warm compresses to the perineal area to decrease swelling and discomfort.  Oral antifungal medications.  Medicated vaginal suppositories or cream for repeated or recurrent infections.  Wash and dry the irritation areas before applying the cream.  Eating yogurt with Lactobacillus may help with prevention and treatment.  Sometimes painting the vagina with gentian violet solution may help if creams and suppositories do not work. Men  Antifungal creams and oral antifungal medications.  Sometimes treatment must continue for 30 days after the symptoms go away to prevent recurrence. HOME CARE INSTRUCTIONS  Women  Use cotton underwear and avoid tight-fitting clothing.  Avoid colored, scented toilet paper and deodorant tampons or pads.  Do not douche.  Keep your diabetes under control.  Finish all the prescribed medications.  Keep your skin clean and dry.  Consume milk or yogurt with Lactobacillus-active culture regularly. If you get frequent yeast infections and think that is what the infection is, there are over-the-counter medications that you can get. If the infection does not show healing in 3 days, talk to your caregiver.  Tell your sex partner you have a yeast infection. Your partner may need  treatment also, especially if your infection does not clear up or recurs. Men  Keep your skin clean and dry.  Keep your diabetes under control.  Finish all prescribed medications.  Tell your sex partner that you have a yeast infection so he or she can be treated if necessary. SEEK MEDICAL CARE IF:   Your symptoms do not clear up or worsen in one week after treatment.  You have an oral temperature above 102 F (38.9 C).  You have  trouble swallowing or eating for a prolonged time.  You develop blisters on and around your vagina.  You develop vaginal bleeding and it is not your menstrual period.  You develop abdominal pain.  You develop intestinal problems as mentioned above.  You get weak or light-headed.  You have painful or increased urination.  You have pain during sexual intercourse. MAKE SURE YOU:   Understand these instructions.  Will watch your condition.  Will get help right away if you are not doing well or get worse. Document Released: 03/09/2004 Document Revised: 06/16/2013 Document Reviewed: 06/21/2009 [facility] Patient Information 2015 [place]Care, [place]. This information is not intended to replace advice given to you by your health care provider. Make sure you discuss any questions you have with your health care provider.

## 2014-12-28 ENCOUNTER — Ambulatory Visit (INDEPENDENT_AMBULATORY_CARE_PROVIDER_SITE_OTHER): Payer: Medicaid Other | Admitting: Obstetrics & Gynecology

## 2014-12-28 ENCOUNTER — Encounter: Payer: Self-pay | Admitting: Obstetrics & Gynecology

## 2014-12-28 VITALS — BP 112/75 | HR 118 | Wt 137.0 lb

## 2014-12-28 DIAGNOSIS — Z3483 Encounter for supervision of other normal pregnancy, third trimester: Secondary | ICD-10-CM

## 2014-12-28 NOTE — Progress Notes (Signed)
Subjective:  Alexa White is a 29 y.o. G3P1011 at 3969w5d being seen today for ongoing prenatal care. [place] [facility] [place] [facility] less than dates and tells me that they reassured her about the fetal growth.  Patient reports no complaints.  Contractions: Not present.  Vag. Bleeding: None. Movement: Present. Denies leaking of fluid.   The following portions of the patient's history were reviewed and updated as appropriate: allergies, current medications, past family history, past medical history, past social history, past surgical history and problem list. Problem list updated.  Objective:   Filed Vitals:   12/28/14 1448  BP: 112/75  Pulse: 118  Weight: 137 lb (62.143 kg)    Fetal Status: Fetal Heart Rate (bpm): 140   Movement: Present     General:  Alert, oriented and cooperative. Patient is in no acute distress.  Skin: Skin is warm and dry. No rash noted.   Cardiovascular: Normal heart rate noted  Respiratory: Normal respiratory effort, no problems with respiration noted  Abdomen: Soft, gravid, appropriate for gestational age. Pain/Pressure: Present     Pelvic: Vag. Bleeding: None Vag D/C Character: Thin   Cervical exam deferred        Extremities: Normal range of motion.  Edema: None  Mental Status: Normal mood and affect. Normal behavior. Normal judgment and thought content.   Urinalysis:      Assessment and Plan:  Pregnancy: G3P1011 at 3769w5d  1. Encounter for supervision of other normal pregnancy in third trimester  Term labor symptoms and general obstetric precautions including but not limited to vaginal bleeding, contractions, leaking of fluid and fetal movement were reviewed in detail with the patient. Please refer to After Visit Summary for other counseling recommendations.  Return in about 1 week (around 01/04/2015).   Allie BossierMyra C Idan Prime, MD

## 2014-12-29 ENCOUNTER — Encounter: Payer: Self-pay | Admitting: *Deleted

## 2015-01-02 ENCOUNTER — Inpatient Hospital Stay (HOSPITAL_COMMUNITY)
Admission: AD | Admit: 2015-01-02 | Discharge: 2015-01-03 | Disposition: A | Discharge status: Home / Self Care | Payer: Medicaid Other | Source: Ambulatory Visit | Attending: Obstetrics and Gynecology | Admitting: Obstetrics and Gynecology

## 2015-01-02 DIAGNOSIS — Z3493 Encounter for supervision of normal pregnancy, unspecified, third trimester: Secondary | ICD-10-CM | POA: Insufficient documentation

## 2015-01-02 DIAGNOSIS — Z3483 Encounter for supervision of other normal pregnancy, third trimester: Secondary | ICD-10-CM

## 2015-01-03 ENCOUNTER — Encounter (HOSPITAL_COMMUNITY): Payer: Self-pay | Admitting: *Deleted

## 2015-01-03 DIAGNOSIS — Z3493 Encounter for supervision of normal pregnancy, unspecified, third trimester: Secondary | ICD-10-CM | POA: Diagnosis not present

## 2015-01-03 LAB — URINALYSIS, ROUTINE W REFLEX MICROSCOPIC
BILIRUBIN URINE: NEGATIVE
GLUCOSE, UA: NEGATIVE mg/dL
HGB URINE DIPSTICK: NEGATIVE
Ketones, ur: NEGATIVE mg/dL
Leukocytes, UA: NEGATIVE
Nitrite: NEGATIVE
Protein, ur: NEGATIVE mg/dL
SPECIFIC GRAVITY, URINE: 1.015 (ref 1.005–1.030)
pH: 6.5 (ref 5.0–8.0)

## 2015-01-03 NOTE — MAU Note (Signed)
Having some lower abd pain and rectal pressure. Some lower back pain. Had diarrhea x 1 and got really hot and threw up once. Having braxton hicks ctxs all day. Worse for last hour and half. Denies LOF or bleeding

## 2015-01-04 ENCOUNTER — Encounter: Payer: Self-pay | Admitting: *Deleted

## 2015-01-05 ENCOUNTER — Ambulatory Visit (INDEPENDENT_AMBULATORY_CARE_PROVIDER_SITE_OTHER): Payer: Medicaid Other | Admitting: Obstetrics & Gynecology

## 2015-01-05 ENCOUNTER — Other Ambulatory Visit (HOSPITAL_COMMUNITY)
Admission: RE | Admit: 2015-01-05 | Discharge: 2015-01-05 | Disposition: A | Discharge status: Home / Self Care | Payer: Medicaid Other | Source: Ambulatory Visit | Attending: Obstetrics & Gynecology | Admitting: Obstetrics & Gynecology

## 2015-01-05 VITALS — BP 118/81 | HR 102 | Wt 136.0 lb

## 2015-01-05 DIAGNOSIS — Z113 Encounter for screening for infections with a predominantly sexual mode of transmission: Secondary | ICD-10-CM | POA: Insufficient documentation

## 2015-01-05 DIAGNOSIS — Z36 Encounter for antenatal screening of mother: Secondary | ICD-10-CM | POA: Diagnosis not present

## 2015-01-05 DIAGNOSIS — Z3483 Encounter for supervision of other normal pregnancy, third trimester: Secondary | ICD-10-CM

## 2015-01-05 NOTE — Addendum Note (Signed)
Addended by: Tandy GawHINTON, Kordell Jafri C on: 01/05/2015 09:43 AM   Modules accepted: Orders

## 2015-01-05 NOTE — Progress Notes (Signed)
Subjective:  Alexa White is a 29 y.o. G3P1011 at 6243w6d being seen today for ongoing prenatal care.  She is currently monitored for the following issues for this low-risk pregnancy: Patient Active Problem List   Diagnosis Date Noted  . Encounter for supervision of other normal pregnancy in third trimester 12/28/2014   Patient reports no complaints.  Contractions: Irregular. Vag. Bleeding: None.  Movement: Present. Denies leaking of fluid.   The following portions of the patient's history were reviewed and updated as appropriate: allergies, current medications, past family history, past medical history, past social history, past surgical history and problem list. Problem list updated.  Objective:   Filed Vitals:   01/05/15 0926  BP: 118/81  Pulse: 102  Weight: 136 lb (61.689 kg)    Fetal Status: Fetal Heart Rate (bpm): 154 Fundal Height: 37 cm Movement: Present  Presentation: Vertex  General:  Alert, oriented and cooperative. Patient is in no acute distress.  Skin: Skin is warm and dry. No rash noted.   Cardiovascular: Normal heart rate noted  Respiratory: Normal respiratory effort, no problems with respiration noted  Abdomen: Soft, gravid, appropriate for gestational age. Pain/Pressure: Present     Pelvic: Vag. Bleeding: None Vag D/C Character: Thin   Cervical exam performed Dilation: 2 Effacement (%): 90 Station: +1  Extremities: Normal range of motion.  Edema: None  Mental Status: Normal mood and affect. Normal behavior. Normal judgment and thought content.   Urinalysis:      Assessment and Plan:  Pregnancy: G3P1011 at 6643w6d  1. Encounter for supervision of other normal pregnancy in third trimester  - Culture, beta strep (group b only) - GC/Chlamydia Probe Amp  Term labor symptoms and general obstetric precautions including but not limited to vaginal bleeding, contractions, leaking of fluid and fetal movement were reviewed in detail with the patient. Please refer to  After Visit Summary for other counseling recommendations.  Return in about 1 week (around 01/12/2015).   Allie BossierMyra C Erma Joubert, MD

## 2015-01-06 LAB — URINE CYTOLOGY ANCILLARY ONLY
Chlamydia: NEGATIVE
Neisseria Gonorrhea: NEGATIVE

## 2015-01-07 LAB — CULTURE, BETA STREP (GROUP B ONLY)

## 2015-01-12 ENCOUNTER — Encounter: Payer: Self-pay | Admitting: Family Medicine

## 2015-01-12 ENCOUNTER — Encounter (HOSPITAL_COMMUNITY): Payer: Self-pay

## 2015-01-12 ENCOUNTER — Ambulatory Visit (INDEPENDENT_AMBULATORY_CARE_PROVIDER_SITE_OTHER): Payer: Medicaid Other | Admitting: Family Medicine

## 2015-01-12 ENCOUNTER — Inpatient Hospital Stay (HOSPITAL_COMMUNITY)
Admission: AD | Admit: 2015-01-12 | Discharge: 2015-01-12 | Disposition: A | Discharge status: Home / Self Care | Payer: Medicaid Other | Source: Ambulatory Visit | Attending: Obstetrics & Gynecology | Admitting: Obstetrics & Gynecology

## 2015-01-12 VITALS — BP 127/79 | HR 114 | Wt 137.0 lb

## 2015-01-12 DIAGNOSIS — Z87891 Personal history of nicotine dependence: Secondary | ICD-10-CM | POA: Insufficient documentation

## 2015-01-12 DIAGNOSIS — Z3A38 38 weeks gestation of pregnancy: Secondary | ICD-10-CM | POA: Insufficient documentation

## 2015-01-12 DIAGNOSIS — O99613 Diseases of the digestive system complicating pregnancy, third trimester: Secondary | ICD-10-CM | POA: Insufficient documentation

## 2015-01-12 DIAGNOSIS — O219 Vomiting of pregnancy, unspecified: Secondary | ICD-10-CM | POA: Diagnosis not present

## 2015-01-12 DIAGNOSIS — O99820 Streptococcus B carrier state complicating pregnancy: Secondary | ICD-10-CM

## 2015-01-12 DIAGNOSIS — K219 Gastro-esophageal reflux disease without esophagitis: Secondary | ICD-10-CM | POA: Insufficient documentation

## 2015-01-12 DIAGNOSIS — R109 Unspecified abdominal pain: Secondary | ICD-10-CM | POA: Diagnosis present

## 2015-01-12 DIAGNOSIS — Z3483 Encounter for supervision of other normal pregnancy, third trimester: Secondary | ICD-10-CM

## 2015-01-12 DIAGNOSIS — Z22330 Carrier of Group B streptococcus: Secondary | ICD-10-CM

## 2015-01-12 LAB — URINE MICROSCOPIC-ADD ON

## 2015-01-12 LAB — URINALYSIS, ROUTINE W REFLEX MICROSCOPIC
Bilirubin Urine: NEGATIVE
GLUCOSE, UA: NEGATIVE mg/dL
HGB URINE DIPSTICK: NEGATIVE
KETONES UR: NEGATIVE mg/dL
NITRITE: NEGATIVE
PH: 6 (ref 5.0–8.0)
PROTEIN: NEGATIVE mg/dL
SPECIFIC GRAVITY, URINE: 1.02 (ref 1.005–1.030)

## 2015-01-12 MED ORDER — PANTOPRAZOLE SODIUM 40 MG PO TBEC
40.0000 mg | DELAYED_RELEASE_TABLET | Freq: Once | ORAL | Status: AC
  Administered 2015-01-12: 40 mg via ORAL

## 2015-01-12 MED ORDER — PANTOPRAZOLE SODIUM 40 MG PO TBEC
40.0000 mg | DELAYED_RELEASE_TABLET | Freq: Every day | ORAL | Status: DC
  Filled 2015-01-12 (×2): qty 1

## 2015-01-12 MED ORDER — GI COCKTAIL ~~LOC~~
30.0000 mL | Freq: Once | ORAL | Status: AC
  Administered 2015-01-12: 30 mL via ORAL
  Filled 2015-01-12: qty 30

## 2015-01-12 MED ORDER — OMEPRAZOLE 20 MG PO CPDR: 20.0000 mg | DELAYED_RELEASE_CAPSULE | Freq: Two times a day (BID) | ORAL | Status: DC

## 2015-01-12 NOTE — Discharge Instructions (Signed)

## 2015-01-12 NOTE — Progress Notes (Signed)
Subjective:  Alexa White is a 29 y.o. G3P1011 at 10564w6d being seen today for ongoing prenatal care.  Patient reports occasional contractions.   .  Vag. Bleeding: None. Movement: Present. Denies leaking of fluid.   The following portions of the patient's history were reviewed and updated as appropriate: allergies, current medications, past family history, past medical history, past social history, past surgical history and problem list.   Objective:   Filed Vitals:   01/12/15 1123  BP: 127/79  Pulse: 114  Weight: 137 lb (62.143 kg)    Fetal Status: Fetal Heart Rate (bpm): 140   Movement: Present     General:  Alert, oriented and cooperative. Patient is in no acute distress.  Skin: Skin is warm and dry. No rash noted.   Cardiovascular: Normal heart rate noted  Respiratory: Normal respiratory effort, no problems with respiration noted  Abdomen: Soft, gravid, appropriate for gestational age. Pain/Pressure: Present     Pelvic: Vag. Bleeding: None Vag D/C Character: Thin   Cervical exam performed        Extremities: Normal range of motion.  Edema: None  Mental Status: Normal mood and affect. Normal behavior. Normal judgment and thought content.   Urinalysis: Urine Protein: Negative Urine Glucose: Negative  Assessment and Plan:  Pregnancy: G3P1011 at 4564w6d  1. Encounter for supervision of other normal pregnancy in third trimester Cont PNV Encouraged increased frequency of meals/snacks with protein each time.    2. Group B Streptococcus carrier, +RV culture, currently pregnant   Term labor symptoms and general obstetric precautions including but not limited to vaginal bleeding, contractions, leaking of fluid and fetal movement were reviewed in detail with the patient. Please refer to After Visit Summary for other counseling recommendations.  Return in about 1 week (around 01/19/2015) for OBF.   Bertram DenverKaren E [name] Clark, PA-C

## 2015-01-12 NOTE — Patient Instructions (Signed)
Pain Relief During Labor and Delivery Everyone experiences pain differently, but labor causes severe pain for many women. The amount of pain you experience during labor and delivery depends on your pain tolerance, contraction strength, and your baby's size and position. There are many ways to prepare for and deal with the pain, including:   Taking prenatal classes to learn about labor and delivery. The more informed you are, the less anxious and afraid you may be. This can help lessen the pain.  Taking pain-relieving medicine during labor and delivery.  Learning breathing and relaxation techniques.  Taking a shower or bath.  Getting massaged.  Changing positions.  Placing an ice pack on your back. Discuss your pain control options with your health care provider during your prenatal visits.  WHAT ARE THE TWO TYPES OF PAIN-RELIEVING MEDICINES? 1. Analgesics. These are medicines that decrease pain without total loss of feeling or muscle movement. 2. Anesthetics. These are medicines that block all feeling, including pain. There can be minor side effects of both types, such as nausea, trouble concentrating, becoming sleepy, and lowering the heart rate of the baby. However, health care providers are careful to give doses that will not seriously affect the baby.  WHAT ARE THE SPECIFIC TYPES OF ANALGESICS AND ANESTHETICS? Systemic Analgesic Systemic pain medicines affect your whole body rather than focusing pain relief on the area of your body experiencing pain. This type of medicine is given either through an IV tube in your vein or by a shot (injection) into your muscle. This medicine will lessen your pain but will not stop it completely. It may also make you sleepy, but it will not make you lose consciousness.  Local Anesthetic Local anesthetic isused tonumb a small area of your body. The medicine is injected into the area of nerves that carry feeling to the vagina, vulva, or the area between  the vagina and anus (perineum).  General Anesthetic This type of medicine causes you to lose consciousness so you do not feel pain. It is usually used only in emergency situations during labor. It is given through an IV tube or face mask. Paracervical Block A paracervical block is a form of local anesthesia given during labor. Numbing medicine is injected into the right and left sides of the cervix and vagina. It helps to lessen the pain caused by contractions and stretching of the cervix. It may have to be given more than once.  Pudendal Block A pudendal block is another form of local anesthesia. It is used to relieve the pain associated with pushing or stretching of the perineum at the time of delivery. An injection is given deep through the vaginal wall into the pudendal nerve in the pelvis, numbing the perineum.  Epidural Anesthetic An epidural is an injection of numbing medicine given in the lower back and into the epidural space near your spinal cord. The epidural numbs the lower half of your body. You may be able to move your legs but will not be allowed to walk. Epidurals can be used for labor, delivery, or cesarean deliveries.  To prevent the medicine from wearing off, a small tube (catheter) may be threaded into the epidural space and taped in place to prevent it from slipping out. Medicine can then be given continuously in small doses through the tube until you deliver. Spinal Block A spinal block is similar to an epidural, but the medicine is injected into the spinal fluid, not the epidural space. A spinal block is only given   once. It starts to relieve pain quickly but lasts only 1-2 hours. Spinal blocks can also be used for cesarean deliveries.  Combined Spinal-Epidural Block Combined spinal-epidural blocks combine the benefits of both the spinal and epidural blocks. The spinal part acts quickly to relieve pain and the epidural provides continuous pain relief. Hydrotherapy Immersion in  warm water during labor may provide comfort and relaxation. It may also help to lessen pain, the use of anesthesia, and the length of labor. However, immersion in water during the delivery (water birth) may have some risk involved and studies to determine safety and risks are ongoing. If you are a healthy woman who is expecting an uncomplicated birth, talk with your health care provider to see if water birth is an option for you.    This information is not intended to replace advice given to you by your health care provider. Make sure you discuss any questions you have with your health care provider.   Document Released: 05/18/2008 Document Revised: 02/04/2013 Document Reviewed: 06/20/2012 Elsevier Interactive Patient Education 2016 [employer]  

## 2015-01-12 NOTE — MAU Provider Note (Signed)
None     Chief Complaint:  Emesis During Pregnancy; Diarrhea; Abdominal Pain; and Headache   Alexa White is  29 y.o. G3P1011 at 6248w6d presents complaining of Emesis During Pregnancy; Diarrhea; Abdominal Pain; and Headache .  She states none contractions are associated with none vaginal bleeding, intact membranes, along with active fetal movement. Pt. States that she was seen in the office today without any complaints. She has had GERD throughout her pregnancy, and was prescribed zantac to help. She had a cheesburger around 5:30 pm, and then experienced some acid reflux. She tried to take the zantac, but then experienced nausea / vomiting x 1. She had one episode of diarrhea. She then came to the MAU because she was not feeling very well, and she was having epigastric pain. Pain is nonradiating and constant. She says that she has not had any fever, chills, flank pain, abdominal pain / contractions, no dysuria, hematuria. No pelvic pain. No vaginal itching or burning. No vision changes, headaches, RUQ pain. She says that she has thrown up with her phenergan pills as well earlier in her pregnancy. Pregnancy otherwise uncomplicated.   Obstetrical/Gynecological History: OB History    Gravida Para Term Preterm AB TAB SAB Ectopic Multiple Living   3 1 1  1 1    1      Past Medical History: Past Medical History  Diagnosis Date  . Anemia     Past Surgical History: Past Surgical History  Procedure Laterality Date  . Nose repair      broken nose- had to be reset  . Wisdom tooth extraction  2011    Family History: Family History  Problem Relation Age of Onset  . Mental illness Mother     bipolar    Social History: Social History  Substance Use Topics  . Smoking status: Former Smoker    Quit date: 06/01/2014  . Smokeless tobacco: None  . Alcohol Use: No    Allergies: No Known Allergies  Meds:  Prescriptions prior to admission  Medication Sig Dispense Refill Last Dose  .  ferrous sulfate 325 (65 FE) MG tablet Take 325 mg by mouth daily with breakfast.   01/12/2015 at Unknown time  . Prenatal Vit-Fe Fumarate-FA (PRENATAL MULTIVITAMIN) TABS tablet Take 1 tablet by mouth at bedtime.   01/12/2015 at Unknown time  . ranitidine (ZANTAC) 75 MG tablet Take 75 mg by mouth 2 (two) times daily.   01/12/2015 at Unknown time  . promethazine (PHENERGAN) 25 MG tablet Take 12.5 mg by mouth 2 (two) times daily as needed for nausea or vomiting.   prn at Unknown time    Review of Systems -   Review of Systems  Constitutional: Negative for fever, chills, weight loss, malaise/fatigue and diaphoresis.  HENT: Negative for hearing loss, ear pain, nosebleeds, congestion, sore throat, neck pain, tinnitus and ear discharge.   Eyes: Negative for blurred vision, double vision, photophobia, pain, discharge and redness.  Respiratory: Negative for cough, hemoptysis, sputum production, shortness of breath, wheezing and stridor.   Cardiovascular: Negative for chest pain, palpitations, orthopnea,  leg swelling  Gastrointestinal: Negative for abdominal pain heartburn, nausea, vomiting, diarrhea, constipation, blood in stool Genitourinary: Negative for dysuria, urgency, frequency, hematuria and flank pain.  Musculoskeletal: Negative for myalgias, back pain, joint pain and falls.  Skin: Negative for itching and rash.  Neurological: Negative for dizziness, tingling, tremors, sensory change, speech change, focal weakness, seizures, loss of consciousness, weakness and headaches.  Endo/Heme/Allergies: Negative for environmental allergies and  polydipsia. Does not bruise/bleed easily.  Psychiatric/Behavioral: Negative for depression, suicidal ideas, hallucinations, memory loss and substance abuse. The patient is not nervous/anxious and does not have insomnia.      Physical Exam  Blood pressure 117/75, pulse 96, temperature 98.4 F (36.9 C), temperature source Oral, resp. rate 18, height  (1.575  m), weight 62.506 kg (137 lb 12.8 oz), last menstrual period 04/15/2014. GENERAL: Well-developed, well-nourished female in no acute distress.  LUNGS: Clear to auscultation bilaterally.  HEART: Regular rate and rhythm. ABDOMEN: Soft, nontender, nondistended, gravid.  EXTREMITIES: Nontender, no edema, 2+ distal pulses. DTR's 2+ CERVICAL EXAM: deferred.  FHT:  Baseline rate 130 bpm   Variability moderate  Accelerations present   Decelerations none Contractions: none   Labs: Results for orders placed or performed during the hospital encounter of 01/12/15 (from the past 24 hour(s))  Urinalysis, Routine w reflex microscopic (not at [facility])   Collection Time: 01/12/15  9:09 PM  Result Value Ref Range   Color, Urine YELLOW YELLOW   APPearance CLEAR CLEAR   Specific Gravity, Urine 1.020 1.005 - 1.030   pH 6.0 5.0 - 8.0   Glucose, UA NEGATIVE NEGATIVE mg/dL   Hgb urine dipstick NEGATIVE NEGATIVE   Bilirubin Urine NEGATIVE NEGATIVE   Ketones, ur NEGATIVE NEGATIVE mg/dL   Protein, ur NEGATIVE NEGATIVE mg/dL   Nitrite NEGATIVE NEGATIVE   Leukocytes, UA SMALL (A) NEGATIVE  Urine microscopic-add on   Collection Time: 01/12/15  9:09 PM  Result Value Ref Range   Squamous Epithelial / LPF 0-5 (A) NONE SEEN   WBC, UA 0-5 0 - 5 WBC/hpf   RBC / HPF 0-5 0 - 5 RBC/hpf   Bacteria, UA FEW (A) NONE SEEN   Imaging Studies:  No results found.  Assessment: Alexa White is  29 y.o. G3P1011 at [redacted]w[redacted]d presents with GERD and one episode of nausea / vomiting.   Plan: - GI cocktail.  - Protonix.  - Switch from zantac to prilosec OTC upon discharge.  - U/A normal.  - Home with normal prenatal follow up.  - Return precautions reviewed.   Caleb Melancon 11/29/201610:18 PM  OB fellow attestation: I have seen and examined this patient; I agree with above documentation in the resident's note.   Alexa White is a 29 y.o. G3P1011 reporting +FM, denies LOF, VB, contractions, vaginal  discharge.  PE: BP 123/80 mmHg  Pulse 81  Temp(Src) 98.4 F (36.9 C) (Oral)  Resp 18  Ht  (1.575 m)  Wt 137 lb 12.8 oz (62.506 kg)  BMI 25.20 kg/m2  LMP 04/15/2014 Gen: calm comfortable, NAD Resp: normal effort, no distress Abd: gravid  ROS, labs, PMH reviewed NST reactive  Plan: -s/p GI cocktail and feeling better -answered questions  -return precautions -outpatient follow up  Federico Flake, MD 11:37 PM

## 2015-01-12 NOTE — MAU Note (Signed)
Pt states that she ate cheeseburger around 1730. Took acid reflux medication around 7pm-vomited shortly after. States she also had 1 episode of diarrhea tonight as well. States that she has intermittent nausea and intermittent abdominal pain-does not feel like contractions, although she states she has occasional contractions. Denies vag bleeding. Having some increase in discharge, denies LOF. +FM

## 2015-01-12 NOTE — Progress Notes (Signed)
Pt c/o increasing pressure and contractions that she feels more at night.

## 2015-01-19 ENCOUNTER — Ambulatory Visit (INDEPENDENT_AMBULATORY_CARE_PROVIDER_SITE_OTHER): Payer: Medicaid Other | Admitting: Obstetrics & Gynecology

## 2015-01-19 VITALS — BP 120/78 | HR 98 | Wt 139.0 lb

## 2015-01-19 DIAGNOSIS — Z22330 Carrier of Group B streptococcus: Secondary | ICD-10-CM

## 2015-01-19 DIAGNOSIS — O26893 Other specified pregnancy related conditions, third trimester: Secondary | ICD-10-CM | POA: Diagnosis not present

## 2015-01-19 DIAGNOSIS — N898 Other specified noninflammatory disorders of vagina: Secondary | ICD-10-CM

## 2015-01-19 DIAGNOSIS — O99820 Streptococcus B carrier state complicating pregnancy: Secondary | ICD-10-CM

## 2015-01-19 DIAGNOSIS — Z3483 Encounter for supervision of other normal pregnancy, third trimester: Secondary | ICD-10-CM

## 2015-01-19 DIAGNOSIS — B379 Candidiasis, unspecified: Secondary | ICD-10-CM

## 2015-01-19 NOTE — Progress Notes (Signed)
Subjective:  Alexa White is a 29 y.o. G3P1011 at 3036w6d being seen today for ongoing prenatal care.  She is currently monitored for the following issues for this low-risk pregnancy and has Encounter for supervision of other normal pregnancy in third trimester and Group B Streptococcus carrier, +RV culture, currently pregnant on her problem list.  Patient reports Contractions: Irregular. Vag. Bleeding: None.  Movement: Present. Denies leaking of fluid.   The following portions of the patient's history were reviewed and updated as appropriate: allergies, current medications, past family history, past medical history, past social history, past surgical history and problem list. Problem list updated.  Objective:   Filed Vitals:   01/19/15 1151  BP: 120/78  Pulse: 98  Weight: 139 lb (63.05 kg)    Fetal Status: Fetal Heart Rate (bpm): 141 Fundal Height: 38 cm Movement: Present  Presentation: Vertex  General:  Alert, oriented and cooperative. Patient is in no acute distress.  Skin: Skin is warm and dry. No rash noted.   Cardiovascular: Normal heart rate noted  Respiratory: Normal respiratory effort, no problems with respiration noted  Abdomen: Soft, gravid, appropriate for gestational age. Pain/Pressure: Present     Pelvic: Vag. Bleeding: None Vag D/C Character: White   Cervical exam  Dilation: 3.5 Effacement (%): 80 Station: +1. Membranes swept, had some bleeding.  Extremities: Normal range of motion.  Edema: None  Mental Status: Normal mood and affect. Normal behavior. Normal judgment and thought content.   Urinalysis: Urine Protein: Negative Urine Glucose: Negative  Assessment and Plan:  Pregnancy: G3P1011 at 7036w6d  1. Encounter for supervision of other normal pregnancy in third trimester - Start postdates testing this week if remains pregnant, IOL to be scheduled at 41 weeks  2. Group B Streptococcus carrier, +RV culture, currently pregnant - Will treat in labor  3. Vaginal  discharge during pregnancy in third trimester - Wet prep, genital done, will follow up results and manage accordingly.   Term labor symptoms and general obstetric precautions including but not limited to vaginal bleeding, contractions, leaking of fluid and fetal movement were reviewed in detail with the patient. Please refer to After Visit Summary for other counseling recommendations.  Return in about 2 days (around 01/21/2015) for NST.  01/25/15 OB visit, NST, AFI.   Tereso NewcomerUgonna A Stevon Gough, MD

## 2015-01-19 NOTE — Patient Instructions (Signed)
Return to clinic for any obstetric concerns or go to MAU for evaluation  

## 2015-01-19 NOTE — Progress Notes (Signed)
Pt c/o vaginal discharge and irritation.

## 2015-01-20 LAB — WET PREP, GENITAL: TRICH WET PREP: NONE SEEN

## 2015-01-21 ENCOUNTER — Ambulatory Visit (INDEPENDENT_AMBULATORY_CARE_PROVIDER_SITE_OTHER): Payer: Medicaid Other | Admitting: *Deleted

## 2015-01-21 VITALS — BP 114/77 | HR 102 | Wt 140.0 lb

## 2015-01-21 DIAGNOSIS — Z3040 Encounter for surveillance of contraceptives, unspecified: Secondary | ICD-10-CM

## 2015-01-21 DIAGNOSIS — Z3493 Encounter for supervision of normal pregnancy, unspecified, third trimester: Secondary | ICD-10-CM

## 2015-01-21 DIAGNOSIS — O480 Post-term pregnancy: Secondary | ICD-10-CM | POA: Diagnosis not present

## 2015-01-21 NOTE — Addendum Note (Signed)
Addended by: Tandy GawHINTON, Prajwal Fellner C on: 01/21/2015 01:51 PM   Modules accepted: Orders

## 2015-01-22 ENCOUNTER — Inpatient Hospital Stay (HOSPITAL_COMMUNITY): Payer: Medicaid Other | Admitting: Anesthesiology

## 2015-01-22 ENCOUNTER — Encounter (HOSPITAL_COMMUNITY): Payer: Self-pay | Admitting: *Deleted

## 2015-01-22 ENCOUNTER — Inpatient Hospital Stay (HOSPITAL_COMMUNITY)
Admission: AD | Admit: 2015-01-22 | Discharge: 2015-01-24 | DRG: 775 | Disposition: A | Discharge status: Home / Self Care | Payer: Medicaid Other | Source: Ambulatory Visit | Attending: Obstetrics & Gynecology | Admitting: Obstetrics & Gynecology

## 2015-01-22 DIAGNOSIS — Z818 Family history of other mental and behavioral disorders: Secondary | ICD-10-CM | POA: Diagnosis not present

## 2015-01-22 DIAGNOSIS — O4292 Full-term premature rupture of membranes, unspecified as to length of time between rupture and onset of labor: Secondary | ICD-10-CM | POA: Diagnosis present

## 2015-01-22 DIAGNOSIS — Z3483 Encounter for supervision of other normal pregnancy, third trimester: Secondary | ICD-10-CM

## 2015-01-22 DIAGNOSIS — Z3A40 40 weeks gestation of pregnancy: Secondary | ICD-10-CM

## 2015-01-22 DIAGNOSIS — F1290 Cannabis use, unspecified, uncomplicated: Secondary | ICD-10-CM | POA: Diagnosis present

## 2015-01-22 DIAGNOSIS — O99824 Streptococcus B carrier state complicating childbirth: Secondary | ICD-10-CM | POA: Diagnosis not present

## 2015-01-22 DIAGNOSIS — O9902 Anemia complicating childbirth: Secondary | ICD-10-CM | POA: Diagnosis present

## 2015-01-22 DIAGNOSIS — Z87891 Personal history of nicotine dependence: Secondary | ICD-10-CM

## 2015-01-22 DIAGNOSIS — F1210 Cannabis abuse, uncomplicated: Secondary | ICD-10-CM

## 2015-01-22 DIAGNOSIS — O4290 Premature rupture of membranes, unspecified as to length of time between rupture and onset of labor, unspecified weeks of gestation: Secondary | ICD-10-CM | POA: Diagnosis present

## 2015-01-22 DIAGNOSIS — O99324 Drug use complicating childbirth: Secondary | ICD-10-CM | POA: Diagnosis present

## 2015-01-22 DIAGNOSIS — O480 Post-term pregnancy: Secondary | ICD-10-CM | POA: Diagnosis present

## 2015-01-22 DIAGNOSIS — O99820 Streptococcus B carrier state complicating pregnancy: Secondary | ICD-10-CM

## 2015-01-22 HISTORY — DX: Personal history of nicotine dependence: Z87.891

## 2015-01-22 HISTORY — DX: Cannabis use, unspecified, uncomplicated: F12.90

## 2015-01-22 LAB — CBC
HCT: 35.3 % — ABNORMAL LOW (ref 36.0–46.0)
Hemoglobin: 12.1 g/dL (ref 12.0–15.0)
MCH: 34.2 pg — AB (ref 26.0–34.0)
MCHC: 34.3 g/dL (ref 30.0–36.0)
MCV: 99.7 fL (ref 78.0–100.0)
PLATELETS: 203 10*3/uL (ref 150–400)
RBC: 3.54 MIL/uL — AB (ref 3.87–5.11)
RDW: 13.5 % (ref 11.5–15.5)
WBC: 11.5 10*3/uL — ABNORMAL HIGH (ref 4.0–10.5)

## 2015-01-22 LAB — ABO/RH: ABO/RH(D): O POS

## 2015-01-22 LAB — OB RESULTS CONSOLE GBS: STREP GROUP B AG: POSITIVE

## 2015-01-22 LAB — TYPE AND SCREEN
ABO/RH(D): O POS
Antibody Screen: NEGATIVE

## 2015-01-22 LAB — POCT FERN TEST: POCT Fern Test: POSITIVE

## 2015-01-22 MED ORDER — ACETAMINOPHEN 325 MG PO TABS: 650.0000 mg | ORAL_TABLET | ORAL | Status: DC | PRN

## 2015-01-22 MED ORDER — BENZOCAINE-MENTHOL 20-0.5 % EX AERO
1.0000 "application " | INHALATION_SPRAY | CUTANEOUS | Status: DC | PRN
  Administered 2015-01-22: 1 via TOPICAL
  Filled 2015-01-22: qty 56

## 2015-01-22 MED ORDER — PENICILLIN G POTASSIUM 5000000 UNITS IJ SOLR
5.0000 10*6.[IU] | Freq: Once | INTRAVENOUS | Status: AC
  Administered 2015-01-22: 5 10*6.[IU] via INTRAVENOUS
  Filled 2015-01-22: qty 5

## 2015-01-22 MED ORDER — DIPHENHYDRAMINE HCL 50 MG/ML IJ SOLN: 12.5000 mg | INTRAMUSCULAR | Status: DC | PRN

## 2015-01-22 MED ORDER — CITRIC ACID-SODIUM CITRATE 334-500 MG/5ML PO SOLN: 30.0000 mL | ORAL | Status: DC | PRN

## 2015-01-22 MED ORDER — EPHEDRINE 5 MG/ML INJ
10.0000 mg | INTRAVENOUS | Status: DC | PRN
  Filled 2015-01-22: qty 2

## 2015-01-22 MED ORDER — TERBUTALINE SULFATE 1 MG/ML IJ SOLN
0.2500 mg | Freq: Once | INTRAMUSCULAR | Status: DC | PRN
  Filled 2015-01-22: qty 1

## 2015-01-22 MED ORDER — OXYTOCIN 40 UNITS IN LACTATED RINGERS INFUSION - SIMPLE MED
62.5000 mL/h | INTRAVENOUS | Status: DC
  Administered 2015-01-22: 62.5 mL/h via INTRAVENOUS
  Filled 2015-01-22: qty 1000

## 2015-01-22 MED ORDER — FENTANYL 2.5 MCG/ML BUPIVACAINE 1/10 % EPIDURAL INFUSION (WH - ANES)
14.0000 mL/h | INTRAMUSCULAR | Status: DC | PRN
  Administered 2015-01-22: 14 mL/h via EPIDURAL
  Filled 2015-01-22: qty 125

## 2015-01-22 MED ORDER — OXYCODONE-ACETAMINOPHEN 5-325 MG PO TABS: 2.0000 | ORAL_TABLET | ORAL | Status: DC | PRN

## 2015-01-22 MED ORDER — ONDANSETRON HCL 4 MG/2ML IJ SOLN: 4.0000 mg | INTRAMUSCULAR | Status: DC | PRN

## 2015-01-22 MED ORDER — TETANUS-DIPHTH-ACELL PERTUSSIS 5-2.5-18.5 LF-MCG/0.5 IM SUSP: 0.5000 mL | Freq: Once | INTRAMUSCULAR | Status: DC

## 2015-01-22 MED ORDER — LANOLIN HYDROUS EX OINT: TOPICAL_OINTMENT | CUTANEOUS | Status: DC | PRN

## 2015-01-22 MED ORDER — PENICILLIN G POTASSIUM 5000000 UNITS IJ SOLR
2.5000 10*6.[IU] | INTRAVENOUS | Status: DC
  Administered 2015-01-22: 2.5 10*6.[IU] via INTRAVENOUS
  Filled 2015-01-22 (×3): qty 2.5

## 2015-01-22 MED ORDER — PHENYLEPHRINE 40 MCG/ML (10ML) SYRINGE FOR IV PUSH (FOR BLOOD PRESSURE SUPPORT)
80.0000 ug | PREFILLED_SYRINGE | INTRAVENOUS | Status: DC | PRN
  Filled 2015-01-22: qty 20
  Filled 2015-01-22: qty 2

## 2015-01-22 MED ORDER — LIDOCAINE HCL (PF) 1 % IJ SOLN
INTRAMUSCULAR | Status: DC | PRN
  Administered 2015-01-22 (×2): 5 mL

## 2015-01-22 MED ORDER — OXYTOCIN BOLUS FROM INFUSION
500.0000 mL | INTRAVENOUS | Status: DC
  Administered 2015-01-22: 500 mL via INTRAVENOUS

## 2015-01-22 MED ORDER — WITCH HAZEL-GLYCERIN EX PADS: 1.0000 "application " | MEDICATED_PAD | CUTANEOUS | Status: DC | PRN

## 2015-01-22 MED ORDER — ZOLPIDEM TARTRATE 5 MG PO TABS: 5.0000 mg | ORAL_TABLET | Freq: Every evening | ORAL | Status: DC | PRN

## 2015-01-22 MED ORDER — OXYTOCIN 40 UNITS IN LACTATED RINGERS INFUSION - SIMPLE MED
1.0000 m[IU]/min | INTRAVENOUS | Status: DC
  Administered 2015-01-22: 2 m[IU]/min via INTRAVENOUS
  Administered 2015-01-22: 4 m[IU]/min via INTRAVENOUS

## 2015-01-22 MED ORDER — OXYCODONE-ACETAMINOPHEN 5-325 MG PO TABS
1.0000 | ORAL_TABLET | ORAL | Status: DC | PRN
  Administered 2015-01-23 (×2): 1 via ORAL
  Filled 2015-01-22 (×2): qty 1

## 2015-01-22 MED ORDER — LIDOCAINE HCL (PF) 1 % IJ SOLN
30.0000 mL | INTRAMUSCULAR | Status: DC | PRN
  Filled 2015-01-22: qty 30

## 2015-01-22 MED ORDER — OXYCODONE-ACETAMINOPHEN 5-325 MG PO TABS: 1.0000 | ORAL_TABLET | ORAL | Status: DC | PRN

## 2015-01-22 MED ORDER — ONDANSETRON HCL 4 MG/2ML IJ SOLN: 4.0000 mg | Freq: Four times a day (QID) | INTRAMUSCULAR | Status: DC | PRN

## 2015-01-22 MED ORDER — LACTATED RINGERS IV SOLN: 500.0000 mL | INTRAVENOUS | Status: DC | PRN

## 2015-01-22 MED ORDER — LACTATED RINGERS IV SOLN
INTRAVENOUS | Status: DC
  Administered 2015-01-22: 14:00:00 via INTRAVENOUS

## 2015-01-22 MED ORDER — SENNOSIDES-DOCUSATE SODIUM 8.6-50 MG PO TABS
2.0000 | ORAL_TABLET | ORAL | Status: DC
  Administered 2015-01-22 – 2015-01-23 (×2): 2 via ORAL
  Filled 2015-01-22 (×2): qty 2

## 2015-01-22 MED ORDER — IBUPROFEN 600 MG PO TABS
600.0000 mg | ORAL_TABLET | Freq: Four times a day (QID) | ORAL | Status: DC
Start: 2015-01-22 — End: 2015-01-24
  Administered 2015-01-22 – 2015-01-24 (×7): 600 mg via ORAL
  Filled 2015-01-22 (×7): qty 1

## 2015-01-22 MED ORDER — PRENATAL MULTIVITAMIN CH
1.0000 | ORAL_TABLET | Freq: Every day | ORAL | Status: DC
  Administered 2015-01-23 – 2015-01-24 (×2): 1 via ORAL
  Filled 2015-01-22 (×2): qty 1

## 2015-01-22 MED ORDER — ONDANSETRON HCL 4 MG PO TABS: 4.0000 mg | ORAL_TABLET | ORAL | Status: DC | PRN

## 2015-01-22 MED ORDER — DIPHENHYDRAMINE HCL 25 MG PO CAPS: 25.0000 mg | ORAL_CAPSULE | Freq: Four times a day (QID) | ORAL | Status: DC | PRN

## 2015-01-22 MED ORDER — DIBUCAINE 1 % RE OINT: 1.0000 "application " | TOPICAL_OINTMENT | RECTAL | Status: DC | PRN

## 2015-01-22 MED ORDER — SIMETHICONE 80 MG PO CHEW: 80.0000 mg | CHEWABLE_TABLET | ORAL | Status: DC | PRN

## 2015-01-22 NOTE — H&P (Signed)
OBSTETRIC ADMISSION HISTORY AND PHYSICAL  Alexa White is a 29 y.o. female G3P1011 with IUP at 2125w2d by LMP presenting for PROM. She reports +FMs, no VB, no blurry vision, headaches or peripheral edema, and RUQ pain.  She plans on breast feeding. She requests POPs for birth control.  Dating: By LMP --->  Estimated Date of Delivery: 01/20/15  Sono:  @36wk , CWD, normal anatomy, cephalic presentation, posterior placenta, 2691g, 32% EFW   Prenatal History/Complications: Marijuana Use During Pregnancy GBS POSITIVE   Past Medical History: Past Medical History  Diagnosis Date  . Anemia     Past Surgical History: Past Surgical History  Procedure Laterality Date  . Nose repair      broken nose- had to be reset  . Wisdom tooth extraction  2011    Obstetrical History: OB History    Gravida Para Term Preterm AB TAB SAB Ectopic Multiple Living   3 1 1  1 1    1       Social History: Social History   Social History  . Marital Status: Single    Spouse Name: N/A  . Number of Children: N/A  . Years of Education: N/A   Social History Main Topics  . Smoking status: Former Smoker    Quit date: 06/01/2014  . Smokeless tobacco: None  . Alcohol Use: No  . Drug Use: Yes    Special: Marijuana  . Sexual Activity: Yes    Birth Control/ Protection: None   Other Topics Concern  . None   Social History Narrative    Family History: Family History  Problem Relation Age of Onset  . Mental illness Mother     bipolar    Allergies: No Known Allergies  Prescriptions prior to admission  Medication Sig Dispense Refill Last Dose  . ferrous sulfate 325 (65 FE) MG tablet Take 325 mg by mouth daily with breakfast.   Taking  . omeprazole (PRILOSEC) 20 MG capsule Take 1 capsule (20 mg total) by mouth 2 (two) times daily. (Patient not taking: Reported on 01/19/2015) 60 capsule 1 Not Taking  . Prenatal Vit-Fe Fumarate-FA (PRENATAL MULTIVITAMIN) TABS tablet Take 1 tablet by mouth at  bedtime.   Taking  . promethazine (PHENERGAN) 25 MG tablet Take 12.5 mg by mouth 2 (two) times daily as needed for nausea or vomiting.   Not Taking     Review of Systems   All systems reviewed and negative except as stated in HPI  Blood pressure 120/88, pulse 104, temperature 98.1 F (36.7 C), temperature source Oral, resp. rate 20, height 5\' 2"  (1.575 m), weight 63.504 kg (140 lb), last menstrual period 04/15/2014. General appearance: alert, cooperative, appears stated age and no distress Lungs: clear to auscultation bilaterally Heart: regular rate and rhythm Abdomen: soft, non-tender; bowel sounds normal Extremities: Homans sign is negative, no sign of DVT Presentation: unsure Fetal monitoringBaseline: 140 bpm and Variability: Good {> 6 bpm) Uterine activity q346minutes, irregular  Dilation: 3 Effacement (%): 50 Station: -1 Exam by:: Dr. Tomasa BlaseSchultz   Prenatal labs: ABO, Rh: O/Positive/-- (05/06 0000) Antibody: Negative (05/06 0000) Rubella: !Error! RPR: Nonreactive (05/06 0000)  HBsAg: Negative (05/06 0000)  HIV: Non-reactive (05/05 0000)  GBS:   POSITIVE 1 hr Glucola Normal Genetic screening  Normal Anatomy US Normal  Prenatal Transfer Tool  Maternal Diabetes: No Genetic Screening: Normal Maternal Ultrasounds/Referrals: Normal Fetal Ultrasounds or other Referrals:  None Maternal Substance Abuse:  Yes:  Type: Marijuana Significant Maternal Medications:  None Significant Maternal Lab  Results: Lab values include: Group B Strep positive  Results for orders placed or performed during the hospital encounter of 01/22/15 (from the past 24 hour(s))  POCT fern test   Collection Time: 01/22/15  1:38 PM  Result Value Ref Range   POCT Fern Test Positive = ruptured amniotic membanes     Patient Active Problem List   Diagnosis Date Noted  . GBS (group B Streptococcus carrier), +RV culture, currently pregnant 01/22/2015  . PROM (premature rupture of membranes) 01/22/2015  .  Group B Streptococcus carrier, +RV culture, currently pregnant 01/12/2015  . Encounter for supervision of other normal pregnancy in third trimester 12/28/2014    Assessment: Alexa White is a 29 y.o. G3P1011 at [redacted]w[redacted]d here for PROM. Will monitor for spontaneous onset of labor, otherwise, may need to induce with pitocin.   #Labor: Monitor for now, may need to start pitocin if she does not go into labor spontaneously.  #Pain: Epidural available if desired.  #FWB: Reassuring FHTs #ID:  GBS POSITIVE - start PCN #MOF: Breast #MOC:POPs #Circ:  N/A  [name] 01/22/2015, 2:20 PM  Seen and examined by me also Agree with note Will get bedside US to verify vertex if not obvious by next cervical exam Expectant management for now PCN prophylaxis Aviva Signs, CNM

## 2015-01-22 NOTE — Progress Notes (Signed)
Patient ID: Alexa White, female   DOB: 10-Jan-1986, 29 y.o.   MRN: [id number] Doing well, not feeling contractions much  Filed Vitals:   01/22/15 1239 01/22/15 1313 01/22/15 1359 01/22/15 1419  BP: 122/77 122/75 120/88   Pulse: 113 93 104   Temp: 98.1 F (36.7 C)   98.5 F (36.9 C)  TempSrc: Oral   Oral  Resp: 18  20 20   Height:      Weight:       FHR reactive UCs every 8-10 minutes  Dilation: 3 Effacement (%): 50 Cervical Position: Posterior Station: -1 Presentation: Vertex Exam by:: Dr. Tomasa BlaseSchultz  Discussed options of expectant management vs Augmentation. Will start Pitocin Augmentation.

## 2015-01-22 NOTE — Anesthesia Procedure Notes (Signed)
Epidural Patient location during procedure: OB  Staffing Anesthesiologist: Vedder Brittian Performed by: anesthesiologist   Preanesthetic Checklist Completed: patient identified, site marked, surgical consent, pre-op evaluation, timeout performed, IV checked, risks and benefits discussed and monitors and equipment checked  Epidural Patient position: sitting Prep: DuraPrep Patient monitoring: heart rate, continuous pulse ox and blood pressure Approach: right paramedian Location: L3-L4 Injection technique: LOR saline  Needle:  Needle type: Tuohy  Needle gauge: 17 G Needle length: 9 cm and 9 Needle insertion depth: 5 cm Catheter type: closed end flexible Catheter size: 20 Guage Catheter at skin depth: 10 cm Test dose: negative  Assessment Events: blood not aspirated, injection not painful, no injection resistance, negative IV test and no paresthesia  Additional Notes Patient identified. Risks/Benefits/Options discussed with patient including but not limited to bleeding, infection, nerve damage, paralysis, failed block, incomplete pain control, headache, blood pressure changes, nausea, vomiting, reactions to medication both or allergic, itching and postpartum back pain. Confirmed with bedside nurse the patient's most recent platelet count. Confirmed with patient that they are not currently taking any anticoagulation, have any bleeding history or any family history of bleeding disorders. Patient expressed understanding and wished to proceed. All questions were answered. Sterile technique was used throughout the entire procedure. Please see nursing notes for vital signs. Test dose was given through epidural needle and negative prior to continuing to dose epidural or start infusion. Warning signs of high block given to the patient including shortness of breath, tingling/numbness in hands, complete motor block, or any concerning symptoms with instructions to call for help. Patient was given  instructions on fall risk and not to get out of bed. All questions and concerns addressed with instructions to call with any issues.   

## 2015-01-22 NOTE — MAU Note (Signed)
States she feels inconsistent contractions. Started leaking fluid around 1100.

## 2015-01-22 NOTE — Lactation Note (Signed)
This note was copied from the chart of Alexa White. Lactation Consultation Note  Patient Name: Alexa Junie Bameudrey Ten White WNUUV'OToday's Date: 01/22/2015 Reason for consult: Initial assessment Baby at 2 hr of life and mom reports baby is feeding well. Mom was able to get baby latched to the L breast but requested help with the R breast. Discussed marijuana use, baby behavior, feeding frequency, baby belly size, voids, wt loss, breast changes, and nipple care. Demonstrated manual expression, colostrum noted bilaterally. Given lactation handouts and marijuana handout. Aware of OP services and support group.      Maternal Data Has patient been taught Hand Expression?: Yes  Feeding Feeding Type: Breast Fed Length of feed: 15 min  LATCH Score/Interventions Latch: Grasps breast easily, tongue down, lips flanged, rhythmical sucking. Intervention(s): Adjust position;Assist with latch  Audible Swallowing: A few with stimulation Intervention(s): Skin to skin  Type of Nipple: Everted at rest and after stimulation  Comfort (Breast/Nipple): Soft / non-tender     Hold (Positioning): Assistance needed to correctly position infant at breast and maintain latch. Intervention(s): Support Pillows;Position options  LATCH Score: 8  Lactation Tools Discussed/Used WIC Program: Yes   Consult Status Consult Status: Follow-up Date: 01/23/15 Follow-up type: In-patient    Rulon Eisenmengerlizabeth E Marianne Golightly 01/22/2015, 10:52 PM

## 2015-01-22 NOTE — MAU Note (Signed)
[facility] received room assignment of [id number].

## 2015-01-22 NOTE — Anesthesia Preprocedure Evaluation (Signed)

## 2015-01-22 NOTE — Progress Notes (Signed)
Labor Progress Note Yolanda Mangesudrey Ten [name] is a 29 y.o. G3P1011 at 1871w2d presented for IOL after PROM. Currently on pitocin.  S: Doing well, no complaints. Contractions not bothersome.   O:  BP 119/77 mmHg  Pulse 76  Temp(Src) 98.3 F (36.8 C) (Oral)  Resp 20  Ht 5\' 2"  (1.575 m)  Wt 63.504 kg (140 lb)  BMI 25.60 kg/m2  LMP 04/15/2014 EFM: 140 baseline/moderate/reactive + accels  CVE: Dilation: 4 Effacement (%): 70 Cervical Position: Posterior Station: -1 Presentation: Vertex Exam by:: Fleet Contrasachel Muthomi RN   A&P: 29 y.o. G3P1011 5971w2d IOL after PROM.  #Labor: Continue pitocin.  #Pain: Doing well, is considering an epidural #FWB: Excellent #GBS POSITIVE - currently receiving PCN, has had adequate treatment.   Olivia CanterAmanda Radhika Dershem, MD 7:05 PM

## 2015-01-23 LAB — CBC
HEMATOCRIT: 25.2 % — AB (ref 36.0–46.0)
HEMOGLOBIN: 8.8 g/dL — AB (ref 12.0–15.0)
MCH: 34.9 pg — ABNORMAL HIGH (ref 26.0–34.0)
MCHC: 35.3 g/dL (ref 30.0–36.0)
MCV: 98.8 fL (ref 78.0–100.0)
Platelets: 181 10*3/uL (ref 150–400)
RBC: 2.55 MIL/uL — AB (ref 3.87–5.11)
RDW: 13.5 % (ref 11.5–15.5)
WBC: 14.5 10*3/uL — ABNORMAL HIGH (ref 4.0–10.5)

## 2015-01-23 LAB — HIV ANTIBODY (ROUTINE TESTING W REFLEX): HIV SCREEN 4TH GENERATION: NONREACTIVE

## 2015-01-23 LAB — RPR: RPR Ser Ql: NONREACTIVE

## 2015-01-23 MED ORDER — METHYLERGONOVINE MALEATE 0.2 MG PO TABS
0.2000 mg | ORAL_TABLET | Freq: Four times a day (QID) | ORAL | Status: AC
Start: 2015-01-23 — End: 2015-01-23
  Administered 2015-01-23 (×4): 0.2 mg via ORAL
  Filled 2015-01-23 (×4): qty 1

## 2015-01-23 MED ORDER — FERROUS SULFATE 325 (65 FE) MG PO TABS
325.0000 mg | ORAL_TABLET | Freq: Two times a day (BID) | ORAL | Status: DC
  Administered 2015-01-23 – 2015-01-24 (×2): 325 mg via ORAL
  Filled 2015-01-23 (×2): qty 1

## 2015-01-23 NOTE — Progress Notes (Signed)
CLINICAL SOCIAL WORK MATERNAL/CHILD NOTE  Patient Details  Name: Alexa White MRN: [id number] Date of Birth: 01/22/2013  Date: 01/23/2015  Clinical Social Worker Initiating Note: Tykeisha Peer, LCSWDate/ Time Initiated: 01/23/15/1520   Child's Name: Alexa White   Legal Guardian:  (Parents Alexa White and Alexa White)   Need for Interpreter: None   Date of Referral: 01/22/15   Reason for Referral: Other (Comment)   Referral Source: [facility]   Address: [place], [place] [address]  Phone number:  [phone])   Household Members: Significant Other, Minor Children   Natural Supports (not living in the home): Extended Family, Immediate Family   Professional Supports:None   Employment: ([employer] is employed)   Type of Work:     Education:     Museum/gallery curator Resources:Medicaid   Other Resources: Physicist, medical , [place] Considerations Which May Impact Care: none noted  Strengths: Ability to meet basic needs , Home prepared for child , Pediatrician chosen    Risk Factors/Current Problems:  (Hx of marijuana use)   Cognitive State: Able to Concentrate , Alert    Mood/Affect: Happy    CSW Assessment: Acknowledged order for social work consult to address concerns regarding hx of Marijuana use. Met with mother who was pleasant and receptive to CSW. FOB was present and very attentive to newborn. This is his first child. MOB have one other dependent age 23. Parents reside together. Mother admits to occasional use of marijuana during pregnancy for nausea. She denies any other illicit drug use. She denies any need for treatment. Mother was informed of the hospital's drug screen policy and reason for the testing. UDS on newborn is negative. Mother denies any hx of anxiety or depression. No acute social concerns noted at this time. 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  CSW Plan/Description: No current barriers to discharge Will continue to monitor drug screen      Ikey Omary J, LCSW 01/23/2015, 3:29 PM

## 2015-01-23 NOTE — Progress Notes (Signed)
Post Partum Day #1 Subjective: voiding and tolerating PO; felt dizzy last PM when standing at the sink; feels better now; breastfeeding going well but slow  Objective: Blood pressure 108/76, pulse 98, temperature 98 F (36.7 C), temperature source Oral, resp. rate 18, height 5\' 2"  (1.575 m), weight 63.504 kg (140 lb), last menstrual period 04/15/2014, SpO2 100 %, unknown if currently breastfeeding.  Physical Exam:  General: alert, cooperative and no distress Lochia: appropriate Uterine Fundus: firm DVT Evaluation: No evidence of DVT seen on physical exam.   Recent Labs  01/22/15 1400 01/23/15 0605  HGB 12.1 8.8*  HCT 35.3* 25.2*    Assessment/Plan: PP course Anemia  Plan for discharge tomorrow  FeSO4 BID started   LOS: 1 day   SHAW, KIMBERLY CNM 01/23/2015, 7:42 AM

## 2015-01-23 NOTE — Progress Notes (Signed)
Pincus BadderK. Shaw, CNM called and notified of pt's increased bleeding and faint feeling per pt, new orders received and noted.

## 2015-01-23 NOTE — Anesthesia Postprocedure Evaluation (Signed)
Anesthesia Post Note  Patient: Alexa White  Procedure(s) Performed: * No procedures listed *  Patient location during evaluation: Mother Baby Anesthesia Type: Epidural Level of consciousness: awake and alert and oriented Pain management: pain level controlled Vital Signs Assessment: post-procedure vital signs reviewed and stable Respiratory status: spontaneous breathing Cardiovascular status: blood pressure returned to baseline and stable Postop Assessment: no headache, no backache, epidural receding, patient able to bend at knees, no signs of nausea or vomiting and adequate PO intake Anesthetic complications: no    Last Vitals:  Filed Vitals:   01/23/15 0334 01/23/15 0634  BP: 111/61 108/76  Pulse: 82 98  Temp: 36.6 C 36.7 C  Resp: 18 18    Last Pain:  Filed Vitals:   01/23/15 0635  PainSc: 6                  Shivali Quackenbush

## 2015-01-23 NOTE — Lactation Note (Signed)
This note was copied from the chart of Alexa White. Lactation Consultation Note  Patient Name: Alexa Junie Bameudrey Ten White WUJWJ'XToday's Date: 01/23/2015 Reason for consult: Follow-up assessment   F/U with mom of 22 hour old infant. Infant with 9 BF for 10-38 minutes, 1 attempt, 3 voids, and 4 stools since birth. Latch scores are 8 by bedside RN. Infant born at 40 weeks and weighed 7 lb 3.7 oz, has not had a 2nd weight yet. Mom reports that she has trouble latching infant to left breast but is able to get her on. Mom denies breast changes since delivery. She reports some nipple tenderness to left breast with latch that improves with feeding. Infant was latched and nursing on left nipple so unable to assess nipple. Discussed need to relatch infant if pain continues after initial latch. Mom with small breasts and erect nipples. Enc mom to feed 8-12 x in 24 hours at first feeding cues and to maintain I/O records. Mom voiced understanding to teaching. Enc her to call for assistance as needed.   Maternal Data Formula Feeding for Exclusion: No Does the patient have breastfeeding experience prior to this delivery?: Yes  Feeding Feeding Type: Breast Fed Length of feed: 38 min  LATCH Score/Interventions                      Lactation Tools Discussed/Used     Consult Status Consult Status: Follow-up Date: 01/24/15 Follow-up type: In-patient    Silas FloodSharon S Lailani Tool 01/23/2015, 6:29 PM

## 2015-01-24 MED ORDER — FERROUS SULFATE 325 (65 FE) MG PO TABS: 325.0000 mg | ORAL_TABLET | Freq: Every day | ORAL | Status: DC

## 2015-01-24 MED ORDER — IBUPROFEN 600 MG PO TABS: 600.0000 mg | ORAL_TABLET | Freq: Four times a day (QID) | ORAL | Status: DC

## 2015-01-24 NOTE — Discharge Summary (Signed)
OB Discharge Summary     Patient Name: Alexa White DOB: [birth year] MRN: [id number]  Date of admission: 01/22/2015 Delivering MD: Olivia CanterSCHULTZ, AMANDA   Date of discharge: 01/24/2015  Admitting diagnosis: 40WKS, LEAKING FLUID,CTX Intrauterine pregnancy: 5141w2d     Secondary diagnosis:  Active Problems:   GBS (group B Streptococcus carrier), +RV culture, currently pregnant   PROM (premature rupture of membranes)  Additional problems:None     Discharge diagnosis: Term Pregnancy Delivered                                                                                                Post partum procedures:None  Augmentation: Pitocin  Complications: None  Hospital course:  Onset of Labor With Vaginal Delivery     29 y.o. yo [id number] at 7441w2d was admitted in Latent Labor with PROM on 01/22/2015. Patient was noted to be GBS positive, and was adequately treated. Patient was augmented with Pitocin. Patient had an uncomplicated labor course as follows:  Membrane Rupture Time/Date: 11:00 AM ,01/22/2015   Intrapartum Procedures: Episiotomy: None [1]                                         Lacerations:  1st degree [2];Perineal [11]  Patient had a delivery of a Viable infant. 01/22/2015  Information for the patient's newborn:  Ten [name], Girl Magda Paganiniudrey [[id number]][[id number]]  Delivery Method: Vaginal, Spontaneous Delivery (Filed from Delivery Summary)    Pateint had an uncomplicated postpartum course.  She is ambulating, tolerating a regular diet, passing flatus, and urinating well. Patient is discharged home in stable condition on No discharge date for patient encounter.Marland Kitchen.    Physical exam  Filed Vitals:   01/23/15 0334 01/23/15 0634 01/23/15 1135 01/23/15 1900  BP: 111/61 108/76 112/73 105/69  Pulse: 82 98 102 97  Temp: 97.8 F (36.6 C) 98 F (36.7 C) 97.7 F (36.5 C) 97.4 F (36.3 C)  TempSrc: Oral Oral Oral Oral  Resp: 18 18 18 18   Height:      Weight:      SpO2:   100% 100%   General:  alert and cooperative Lochia: inappropriate Uterine Fundus: firm Incision: N/A DVT Evaluation: No evidence of DVT seen on physical exam. Labs: Lab Results  Component Value Date   WBC 14.5* 01/23/2015   HGB 8.8* 01/23/2015   HCT 25.2* 01/23/2015   MCV 98.8 01/23/2015   PLT 181 01/23/2015   No flowsheet data found.  Discharge instruction: per After Visit Summary and "Baby and Me Booklet".  After visit meds:    Medication List    TAKE these medications        ferrous sulfate 325 (65 FE) MG tablet  Take 1 tablet (325 mg total) by mouth daily with breakfast.     ibuprofen 600 MG tablet  Commonly known as:  ADVIL,MOTRIN  Take 1 tablet (600 mg total) by mouth every 6 (six) hours.     omeprazole 20 MG capsule  Commonly known as:  PRILOSEC  Take 1 capsule (20 mg total) by mouth 2 (two) times daily.     prenatal multivitamin Tabs tablet  Take 1 tablet by mouth at bedtime.        Diet: routine diet  Activity: Advance as tolerated. Pelvic rest for 6 weeks.   Outpatient follow up:6 weeks Follow up Appt:Future Appointments Date Time Provider Department Center  01/25/2015 1:30 PM Reva Bores, MD [facility]   Follow up Visit:No Follow-up on file.  Postpartum contraception: Progesterone only pills  Newborn Data: Live born female  Birth Weight: 7 lb 3.7 oz (3280 g) APGAR: 9, 10  Baby Feeding: Breast Disposition:home with mother   01/24/2015 Danella Maiers, MD

## 2015-01-24 NOTE — Lactation Note (Signed)
This note was copied from the chart of Alexa White. Lactation Consultation Note  Patient Name: Alexa Junie Bameudrey Ten White ZOXWR'UToday's Date: 01/24/2015 Reason for consult: Follow-up assessment   Follow up with mom of 36 hour old infant prior to D/C. Infant with 11 BF for 10-30 minutes, 40 cc bottle of formula x 1, 3 voids and 2 stools in last 24 hours. Infant weight today 6 lb 13.9 oz with weight loss 5% since birth. LATCH scores 8-9 per bedside RN.  Mom denies any breast changes today, reports she can hand express colostrum. Mom gave a bottle as infant would not sleep and she is worried that she does not have enough milk. Reviewed stomach size, nutritional needs of NB, cluster feeding and milk coming to volume with mom. Discussed with mom appropriate formula amounts per day of age. Infant currently sleeping on mom's chest. Reviewed all BF information in Taking Care of Baby and Me with  Mom. Engorgement prevention discussed and gave mom manual pump with instructions for cleaning and use. [facility] and is to call Monday to make an appointment. [facility] reviewed with mom, aware of BF Support Groups, OP Services and phone #. Advised mom to call with questions/concerns.    Maternal Data Formula Feeding for Exclusion: No Has patient been taught Hand Expression?: Yes  Feeding Feeding Type: Breast Fed Nipple Type: Slow - flow Length of feed: 10 min  LATCH Score/Interventions                      Lactation Tools Discussed/Used WIC Program: Yes Pump Review: Milk Storage;Setup, frequency, and cleaning   Consult Status Consult Status: Complete Follow-up type: Call as needed    Ed BlalockSharon S Anaih Brander 01/24/2015, 8:52 AM

## 2015-01-24 NOTE — Discharge Instructions (Signed)

## 2015-01-25 ENCOUNTER — Encounter: Payer: Medicaid Other | Admitting: Family Medicine

## 2015-01-25 NOTE — Progress Notes (Signed)
Post discharge chart review completed.  

## 2015-02-26 ENCOUNTER — Encounter: Payer: Self-pay | Admitting: Family Medicine

## 2015-02-26 ENCOUNTER — Ambulatory Visit (INDEPENDENT_AMBULATORY_CARE_PROVIDER_SITE_OTHER): Payer: Medicaid Other | Admitting: Family Medicine

## 2015-02-26 VITALS — BP 111/78 | HR 90 | Resp 18 | Ht 63.0 in | Wt 125.0 lb

## 2015-02-26 DIAGNOSIS — Z30011 Encounter for initial prescription of contraceptive pills: Secondary | ICD-10-CM

## 2015-02-26 DIAGNOSIS — Z789 Other specified health status: Secondary | ICD-10-CM

## 2015-02-26 MED ORDER — NORETHINDRONE 0.35 MG PO TABS: 1.0000 | ORAL_TABLET | Freq: Every day | ORAL | Status: DC

## 2015-02-26 MED ORDER — PRENATAL MULTIVITAMIN CH: 1.0000 | ORAL_TABLET | Freq: Every day | ORAL | Status: DC

## 2015-02-26 NOTE — Patient Instructions (Signed)
Oral Contraception Use Oral contraceptive pills (OCPs) are medicines taken to prevent pregnancy. OCPs work by preventing the ovaries from releasing eggs. The hormones in OCPs also cause the cervical mucus to thicken, preventing the sperm from entering the uterus. The hormones also cause the uterine lining to become thin, not allowing a fertilized egg to attach to the inside of the uterus. OCPs are highly effective when taken exactly as prescribed. However, OCPs do not prevent sexually transmitted diseases (STDs). Safe sex practices, such as using condoms along with an OCP, can help prevent STDs. Before taking OCPs, you may have a physical exam and Pap test. Your health care provider may also order blood tests if necessary. Your health care provider will make sure you are a good candidate for oral contraception. Discuss with your health care provider the possible side effects of the OCP you may be prescribed. When starting an OCP, it can take 2 to 3 months for the body to adjust to the changes in hormone levels in your body.  HOW TO TAKE ORAL CONTRACEPTIVE PILLS Your health care provider may advise you on how to start taking the first cycle of OCPs. Otherwise, you can:   Start on day 1 of your menstrual period. You will not need any backup contraceptive protection with this start time.   Start on the first Sunday after your menstrual period or the day you get your prescription. In these cases, you will need to use backup contraceptive protection for the first week.   Start the pill at any time of your cycle. If you take the pill within 5 days of the start of your period, you are protected against pregnancy right away. In this case, you will not need a backup form of birth control. If you start at any other time of your menstrual cycle, you will need to use another form of birth control for 7 days. If your OCP is the type called a minipill, it will protect you from pregnancy after taking it for 2 days (48  hours). After you have started taking OCPs:   If you forget to take 1 pill, take it as soon as you remember. Take the next pill at the regular time.   If you miss 2 or more pills, call your health care provider because different pills have different instructions for missed doses. Use backup birth control until your next menstrual period starts.   If you use a 28-day pack that contains inactive pills and you miss 1 of the last 7 pills (pills with no hormones), it will not matter. Throw away the rest of the non-hormone pills and start a new pill pack.  No matter which day you start the OCP, you will always start a new pack on that same day of the week. Have an extra pack of OCPs and a backup contraceptive method available in case you miss some pills or lose your OCP pack.  HOME CARE INSTRUCTIONS   Do not smoke.   Always use a condom to protect against STDs. OCPs do not protect against STDs.   Use a calendar to mark your menstrual period days.   Read the information and directions that came with your OCP. Talk to your health care provider if you have questions.  SEEK MEDICAL CARE IF:   You develop nausea and vomiting.   You have abnormal vaginal discharge or bleeding.   You develop a rash.   You miss your menstrual period.   You are losing   your hair.   You need treatment for mood swings or depression.   You get dizzy when taking the OCP.   You develop acne from taking the OCP.   You become pregnant.  SEEK IMMEDIATE MEDICAL CARE IF:   You develop chest pain.   You develop shortness of breath.   You have an uncontrolled or severe headache.   You develop numbness or slurred speech.   You develop visual problems.   You develop pain, redness, and swelling in the legs.    This information is not intended to replace advice given to you by your health care provider. Make sure you discuss any questions you have with your health care provider.   Document  Released: 01/19/2011 Document Revised: 02/20/2014 Document Reviewed: 07/21/2012 Elsevier Interactive Patient Education 2016 Elsevier Inc.  

## 2015-02-26 NOTE — Progress Notes (Signed)
  Subjective:     Alexa White is a 30 y.o. female who presents for a postpartum visit. She is 5 weeks postpartum following a spontaneous vaginal delivery. I have fully reviewed the prenatal and intrapartum course. The delivery was at 40 gestational weeks. Outcome: spontaneous vaginal delivery. Anesthesia: epidural. Postpartum course has been normal. Baby's course has been normal. Baby is feeding by breast. Bleeding no bleeding. Bowel function is normal. Bladder function is normal. Patient is not sexually active. Contraception method is oral progesterone-only contraceptive. Postpartum depression screening: negative.  The following portions of the patient's history were reviewed and updated as appropriate: allergies, current medications, past family history, past medical history, past social history, past surgical history and problem list.  Review of Systems Pertinent items are noted in HPI.   Objective:    BP 111/78 mmHg  Pulse 90  Resp 18  Ht 5\' 3"  (1.6 m)  Wt 125 lb (56.7 kg)  BMI 22.15 kg/m2  Breastfeeding? Yes  General:  alert, cooperative and appears stated age  Abdomen: soft, non-tender; bowel sounds normal; no masses,  no organomegaly        Assessment:     Normal postpartum exam. Pap smear not done at today's visit.   Plan:    1. Contraception: oral progesterone-only contraceptive Switch to combination OCP when stops breast feeding. 2. Pap due 2019 3. Follow up in: 1 year or as needed.

## 2015-02-26 NOTE — Progress Notes (Signed)
Pt here for postpartum check, would like to use the birth control pill for contraception.

## 2015-10-27 ENCOUNTER — Telehealth: Payer: Self-pay | Admitting: *Deleted

## 2015-10-27 NOTE — Telephone Encounter (Signed)
Pt delivered in 01/2015, is currently breast feeding and taking Micronor for birth control.  States she missed a pill a few weeks ago and has been experiencing heavy vaginal bleeding on and off.  Informed pt that missing the pill could have caused her to experience irregular bleeding but to also take an UPT at home to rule out pregnancy as well.  Will schedule appt for evaluation and pt also wanting to consider Depo Provera. Informed pt to go to [facility] for evaluation if bleeding worsened and she was completely saturating one pad per hour as well as having large clots.  Pt acknowledged.

## 2015-10-27 NOTE — Telephone Encounter (Signed)
-----   Message from Olevia BowensJacinda S Battle sent at 10/26/2015  4:04 PM EDT ----- Regarding: AUB w/ OCP Contact: [phone] Having long and heavy bleeding, recently started happening, did state she missed a pill back in August, states she is still breastfeeding wants to know if there are other option for OCP's

## 2015-10-27 NOTE — Telephone Encounter (Signed)
Called pt back LM for her to rtn call if needed

## 2015-11-01 ENCOUNTER — Encounter: Payer: Self-pay | Admitting: Obstetrics and Gynecology

## 2015-11-01 ENCOUNTER — Ambulatory Visit (INDEPENDENT_AMBULATORY_CARE_PROVIDER_SITE_OTHER): Payer: Medicaid Other | Admitting: Obstetrics and Gynecology

## 2015-11-01 ENCOUNTER — Other Ambulatory Visit (HOSPITAL_COMMUNITY)
Admission: RE | Admit: 2015-11-01 | Discharge: 2015-11-01 | Disposition: A | Discharge status: Home / Self Care | Payer: Medicaid Other | Source: Ambulatory Visit | Attending: Obstetrics and Gynecology | Admitting: Obstetrics and Gynecology

## 2015-11-01 VITALS — BP 115/72 | HR 66 | Wt 103.0 lb

## 2015-11-01 DIAGNOSIS — N760 Acute vaginitis: Secondary | ICD-10-CM | POA: Insufficient documentation

## 2015-11-01 DIAGNOSIS — B373 Candidiasis of vulva and vagina: Secondary | ICD-10-CM

## 2015-11-01 DIAGNOSIS — Z01419 Encounter for gynecological examination (general) (routine) without abnormal findings: Secondary | ICD-10-CM | POA: Diagnosis not present

## 2015-11-01 DIAGNOSIS — Z3041 Encounter for surveillance of contraceptive pills: Secondary | ICD-10-CM

## 2015-11-01 DIAGNOSIS — B3731 Acute candidiasis of vulva and vagina: Secondary | ICD-10-CM

## 2015-11-01 DIAGNOSIS — Z113 Encounter for screening for infections with a predominantly sexual mode of transmission: Secondary | ICD-10-CM | POA: Diagnosis present

## 2015-11-01 DIAGNOSIS — N939 Abnormal uterine and vaginal bleeding, unspecified: Secondary | ICD-10-CM | POA: Diagnosis not present

## 2015-11-01 MED ORDER — LO LOESTRIN FE 1 MG-10 MCG / 10 MCG PO TABS: 1.0000 | ORAL_TABLET | Freq: Every day | ORAL | 3 refills | Status: DC

## 2015-11-01 NOTE — Progress Notes (Signed)
Obstetrics and Gynecology Visit Problem Visit Evaluation  Appointment Date: 11/01/2015  OBGYN Clinic: [facility]   Chief Complaint:  Chief Complaint  Patient presents with  . Menstrual Problem    History of Present Illness: Alexa White is a 30 y.o. Caucasian [id number] (Patient's last menstrual period was 10/27/2015.), seen for the above chief complaint. Her past medical history is significant for nothing.  Patient has been on mini pill since her 5wk PP visit in January of this year after her December 2016 SVD.  She usually has a regular, qmonth period that lasts usually around a week with the mini-pill but this past month, since her LMP on 8/30, she's had 3 periods. She states she takes the pill at the same time qday, with las intercourse 3 days ago. She is still breastfeeding.   She used the pills and depo shot in the past  No current pain or abdominal pain, GI or GU s/s.   Review of Systems:  Her 12 point review of systems is negative or as noted in the History of Present Illness.  Past Medical History:  Past Medical History:  Diagnosis Date  . Anemia   . Former smoker    Quit- 06/01/14  . 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 nose repair     broken nose- had to be reset  . WISDOM TOOTH EXTRACTION  2011    Past Obstetrical History:  OB History  Gravida Para Term Preterm AB Living  3 2 2   1 2   SAB TAB Ectopic Multiple Live Births    1   0 2    # Outcome Date GA Lbr Len/2nd Weight Sex Delivery Anes PTL Lv  3 Term 01/22/15 [redacted]w[redacted]d 09:01 / 00:15 7 lb 3.7 oz (3.28 kg) F Vag-Spont EPI  LIV  2 TAB 2011          1 Term 09/11/05   7 lb 13 oz (3.544 kg) M Vag-Spont   LIV      Past Gynecological History: As per HPI. Pap neg 2014  Social History:  Social History   Social History  . Marital status: Single    Spouse name: N/A  . Number of children:  N/A  . Years of education: N/A   Occupational History  . Not on file.   Social History Main Topics  . Smoking status: Former Smoker    Quit date: 06/01/2014  . Smokeless tobacco: Never Used  . Alcohol use No  . Drug use:     Types: Marijuana  . Sexual activity: Yes    Birth control/ protection: None, Pill   Other Topics Concern  . Not on file   Social History Narrative  . No narrative on file    Family History:  Family History  Problem Relation Age of Onset  . Mental illness Mother     bipolar    Medications Alexa White had no medications administered during this visit. Current Outpatient Prescriptions  Medication Sig Dispense Refill  . ferrous sulfate 325 (65 FE) MG tablet Take 1 tablet (325 mg total) by mouth daily with breakfast. 30 tablet 3  . norethindrone (MICRONOR,CAMILA,ERRIN) 0.35 MG tablet Take 1 tablet (0.35 mg total) by mouth daily. 1 Package 11  . Prenatal Vit-Fe Fumarate-FA (PRENATAL MULTIVITAMIN) TABS tablet Take 1 tablet by mouth at bedtime. 90 tablet 2   No  current facility-administered medications for this visit.     Allergies Review of patient's allergies indicates no known allergies.   Physical Exam:  BP 115/72   Pulse 66   Wt 103 lb (46.7 kg)   LMP 10/27/2015   Breastfeeding? Yes   BMI 18.25 kg/m  Body mass index is 18.25 kg/m. General appearance: Well nourished, well developed female in no acute distress.  Respiratory:   Normal respiratory effort Abdomen: positive bowel sounds and no masses, hernias; diffusely non tender to palpation, non distended Neuro/Psych:  Normal mood and affect.  Skin:  Warm and dry.  Lymphatic:  No inguinal lymphadenopathy.   Pelvic exam: is not limited by body habitus EGBUS: within normal limits, Vagina: within normal limits and with no blood in the vault, Cervix: normal appearing cervix without discharge or lesions except mild white-cottage cheese like d/c in vault Uterus:  nonenlarged, and Adnexa:  normal  adnexa and no mass, fullness, tenderness Rectovaginal: deferred  Laboratory: none  Radiology: none  Assessment: AUB, +breasfeeding on the mini pill  Plan:  D/w pt that it sounds like her periods were regular on the mini pill but was odd this month with AUB, which I told her is common on the POP but if she wanted to stay on it that's fine. I also d/w her re: LARC methods and the ring and depo shot. She doesn't smoke tobacco and no h/o VTE, HA and was on the pill as teenager to help regulate her cycles with sports and no issues with that. She also had an SAB and D&C in the remote past and got depo and had AUB for a few months after that but I told her that could've been from the procedure or depo. She is interested in all the options, even ones she's tried in the past and would like to try a convention OCP, which I told her is a great choice and minimal to no risk re: estrogen use given her history.  Pt unable to void today so advised to abstain from intercourse and start new pill in two weeks if home UPT is negative  Pap smear updated today  Pt told that can do monistat 7 if desires tx for asymptomatic yeast infection  RTC 3 month for OCP f/u  Alexa White, Jr MD Attending [facility](Faculty Practice)

## 2015-11-02 LAB — CYTOLOGY - PAP

## 2015-11-02 LAB — CERVICOVAGINAL ANCILLARY ONLY
CHLAMYDIA, DNA PROBE: NEGATIVE
NEISSERIA GONORRHEA: NEGATIVE
Trichomonas: NEGATIVE

## 2016-09-10 ENCOUNTER — Encounter (HOSPITAL_COMMUNITY): Payer: Self-pay | Admitting: Emergency Medicine

## 2016-09-10 ENCOUNTER — Emergency Department (HOSPITAL_COMMUNITY)
Admission: EM | Admit: 2016-09-10 | Discharge: 2016-09-10 | Disposition: A | Discharge status: Home / Self Care | Payer: Medicaid Other | Attending: Emergency Medicine | Admitting: Emergency Medicine

## 2016-09-10 ENCOUNTER — Emergency Department (HOSPITAL_COMMUNITY): Payer: Medicaid Other

## 2016-09-10 DIAGNOSIS — Y999 Unspecified external cause status: Secondary | ICD-10-CM | POA: Insufficient documentation

## 2016-09-10 DIAGNOSIS — Y929 Unspecified place or not applicable: Secondary | ICD-10-CM | POA: Diagnosis not present

## 2016-09-10 DIAGNOSIS — Y9344 Activity, trampolining: Secondary | ICD-10-CM | POA: Insufficient documentation

## 2016-09-10 DIAGNOSIS — S92214A Nondisplaced fracture of cuboid bone of right foot, initial encounter for closed fracture: Secondary | ICD-10-CM | POA: Insufficient documentation

## 2016-09-10 DIAGNOSIS — Z87891 Personal history of nicotine dependence: Secondary | ICD-10-CM | POA: Insufficient documentation

## 2016-09-10 DIAGNOSIS — S99911A Unspecified injury of right ankle, initial encounter: Secondary | ICD-10-CM | POA: Diagnosis present

## 2016-09-10 DIAGNOSIS — X509XXA Other and unspecified overexertion or strenuous movements or postures, initial encounter: Secondary | ICD-10-CM | POA: Insufficient documentation

## 2016-09-10 MED ORDER — IBUPROFEN 800 MG PO TABS
800.0000 mg | ORAL_TABLET | Freq: Once | ORAL | Status: AC
  Administered 2016-09-10: 800 mg via ORAL
  Filled 2016-09-10: qty 1

## 2016-09-10 MED ORDER — IBUPROFEN 600 MG PO TABS: 600.0000 mg | ORAL_TABLET | Freq: Four times a day (QID) | ORAL | 0 refills | Status: DC | PRN

## 2016-09-10 NOTE — ED Notes (Signed)
Patient given ice pack for injury.

## 2016-09-10 NOTE — ED Triage Notes (Signed)
Patient arrives after injuring right ankle at trampoline park tonight. States she landed wrong and twisted her ankle. Appears swollen and bruised. Injury occurred 2 hrs ago.

## 2016-09-10 NOTE — ED Provider Notes (Signed)
[facility]-EMERGENCY DEPT Provider Note   CSN: [id number] Arrival date & time: 09/10/16  1947     History   Chief Complaint Chief Complaint  Patient presents with  . Ankle Injury    HPI Alexa White is a 31 y.o. female.  Patient is a 31 year old female who presents with right ankle and foot pain. She states that she was jumping on trampoline and she came down and it twisted. She heard a pop. She has pain and swelling to the foot and ankle. She denies any numbness. No other injuries. No prior injuries to that foot or ankle.      Past Medical History:  Diagnosis Date  . Anemia   . Former smoker    Quit- 06/01/14  . 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   Past Surgical History:  Procedure Laterality Date  . nose repair     broken nose- had to be reset  . WISDOM TOOTH EXTRACTION  2011    OB History    Gravida Para Term Preterm AB Living   3 2 2   1 2    SAB TAB Ectopic Multiple Live Births     1   0 2       Home Medications    Prior to Admission medications   Medication Sig Start Date End Date Taking? Authorizing Provider  ferrous sulfate 325 (65 FE) MG tablet Take 1 tablet (325 mg total) by mouth daily with breakfast. 01/24/15   Mikell, Antionette PolesAsiyah Zahra, MD  LO LOESTRIN FE 1 MG-10 MCG / 10 MCG tablet Take 1 tablet by mouth daily. 11/01/15   [place] BingPickens, Charlie, MD  Prenatal Vit-Fe Fumarate-FA (PRENATAL MULTIVITAMIN) TABS tablet Take 1 tablet by mouth at bedtime. 02/26/15   Reva BoresPratt, Tanya S, MD    Family History Family History  Problem Relation Age of Onset  . Mental illness Mother        bipolar    Social History Social History  Substance Use Topics  . Smoking status: Former Smoker    Quit date: 06/01/2014  . Smokeless tobacco: Never Used  . Alcohol use Yes     Allergies   Patient has no known allergies.   Review of Systems Review of Systems  Constitutional: Negative for fever.    Gastrointestinal: Negative for nausea and vomiting.  Musculoskeletal: Positive for arthralgias and joint swelling. Negative for back pain and neck pain.  Skin: Negative for wound.  Neurological: Negative for weakness, numbness and headaches.     Physical Exam Updated Vital Signs BP 124/90 (BP Location: Left Arm)   Pulse 62   Temp 99 F (37.2 C) (Oral)   Resp 16   Ht 5\' 4"  (1.626 m)   Wt 47.6 kg (105 lb)   LMP 08/30/2016 (Approximate)   SpO2 100%   BMI 18.02 kg/m   Physical Exam  Constitutional: She is oriented to person, place, and time. She appears well-developed and well-nourished.  HENT:  Head: Normocephalic and atraumatic.  Neck: Normal range of motion. Neck supple.  Cardiovascular: Normal rate.   Pulmonary/Chest: Effort normal.  Musculoskeletal: She exhibits edema and tenderness.  Positive swelling to the proximal dorsum of the right foot. There some mild swelling around the lateral malleolus. There is tenderness primarily over the dorsum of the foot at the proximal third fourth and fifth metatarsals. No wounds. She has normal sensation and motor function in the foot. Pedal pulses  are intact.  Neurological: She is alert and oriented to person, place, and time.  Skin: Skin is warm and dry.  Psychiatric: She has a normal mood and affect.     ED Treatments / Results  Labs (all labs ordered are listed, but only abnormal results are displayed) Labs Reviewed - No data to display  EKG  EKG Interpretation None       Radiology No results found.  Procedures Procedures (including critical care time)  Medications Ordered in ED Medications - No data to display   Initial Impression / Assessment and Plan / ED Course  I have reviewed the triage vital signs and the nursing notes.  Pertinent labs & imaging results that were available during my care of the patient were reviewed by me and considered in my medical decision making (see chart for details).     X-rays  pending.  Jamie Ward to follow up on x-rays and dispo as appropriate  Final Clinical Impressions(s) / ED Diagnoses   Final diagnoses:  None    New Prescriptions New Prescriptions   No medications on file     Rolan BuccoBelfi, Deniesha Stenglein, MD 09/10/16 2106

## 2016-09-10 NOTE — ED Provider Notes (Signed)
Care assumed from previous provider Dr. Fredderick PhenixBelfi. Please see note for further details. Case discussed, plan agreed upon. Will follow up on pending x-ray.   X-ray reviewed:  IMPRESSION: Mildly displaced tiny avulsed fracture, likely of the lateral cortex of the cuboid.   Patient placed in ASO brace. Crutches provided. PCP follow up if symptoms persist. Symptomatic home care instructions discussed. Reasons to return to ER discussed. All questions answered.     Sakia Schrimpf, Chase PicketJaime Pilcher, PA-C 09/10/16 2206    Maia PlanLong, Joshua G, MD 09/13/16 [phone]

## 2016-09-10 NOTE — ED Notes (Signed)
Pt waiting for registration to go over insurance information

## 2016-09-10 NOTE — Discharge Instructions (Signed)
Ibuprofen as needed for pain. Ice and alleviate for additional pain relief. If your symptoms persist without improvement in one week, please follow up with your primary care provider.    TREATMENT  Rest, ice, elevation, and compression are the basic modes of treatment.    HOME CARE INSTRUCTIONS  Apply ice to the sore area for 15 to 20 minutes, 3 to 4 times per day. Do this while you are awake for the first 2 days. This can be stopped when the swelling goes away. Put the ice in a plastic bag and place a towel between the bag of ice and your skin.  Keep your leg elevated when possible to lessen swelling.  You may take off your ankle stabilizer at night and to take a shower or bath. Wiggle your toes several times per day if you are able.  ACTIVITY:            - Weight bearing as tolerated - if you can do it, do it. If it hurts, stop.             - Exercises should be limited to pain free range of motion            - Can start mobilization by tracing the alphabet with your foot in the air.       SEEK MEDICAL CARE IF:  You have an increase in bruising, swelling, or pain.  Your toes feel cold.  Pain relief is not achieved with medications.  EMERGENCY:: Your toes are numb or blue or you have severe pain.

## 2016-09-10 NOTE — Progress Notes (Signed)
Orthopedic Tech Progress Note Patient Details:  Alexa White 21-Oct-1985 [id number]  Ortho Devices Type of Ortho Device: ASO, Crutches Ortho Device/Splint Location: RLE Ortho Device/Splint Interventions: Ordered, Application   Jennye MoccasinHughes, Alexa White 09/10/2016, 10:04 PM

## 2016-09-20 ENCOUNTER — Ambulatory Visit (INDEPENDENT_AMBULATORY_CARE_PROVIDER_SITE_OTHER): Payer: Medicaid Other | Admitting: Family Medicine

## 2016-09-20 ENCOUNTER — Encounter: Payer: Self-pay | Admitting: Family Medicine

## 2016-09-20 VITALS — BP 113/74 | HR 98 | Wt 102.0 lb

## 2016-09-20 DIAGNOSIS — Z3202 Encounter for pregnancy test, result negative: Secondary | ICD-10-CM

## 2016-09-20 DIAGNOSIS — Z30013 Encounter for initial prescription of injectable contraceptive: Secondary | ICD-10-CM

## 2016-09-20 LAB — POCT URINE PREGNANCY: Preg Test, Ur: NEGATIVE

## 2016-09-20 MED ORDER — MEDROXYPROGESTERONE ACETATE 150 MG/ML IM SUSP
150.0000 mg | Freq: Once | INTRAMUSCULAR | Status: AC
  Administered 2016-09-20: 150 mg via INTRAMUSCULAR

## 2016-09-20 MED ORDER — MEDROXYPROGESTERONE ACETATE 150 MG/ML IM SUSP: 150.0000 mg | INTRAMUSCULAR | 4 refills | Status: DC

## 2016-09-27 NOTE — Progress Notes (Signed)
   CLINIC ENCOUNTER NOTE  History:  31 y.o. [id number] here today for contraception. She denies any abnormal vaginal discharge, bleeding, pelvic pain or other concerns. No history of migraines or depression  Past Medical History:  Diagnosis Date  . Anemia   . Former smoker    Quit- 06/01/14  . 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 nose repair     broken nose- had to be reset  . WISDOM TOOTH EXTRACTION  2011    The following portions of the patient's history were reviewed and updated as appropriate: allergies, current medications, past family history, past medical history, past social history, past surgical history and problem list.   Health Maintenance:  Pap UTD Health Maintenance Due  Topic Date Due  . TETANUS/TDAP  06/23/2006   Review of Systems:  Pertinent items noted in HPI and remainder of comprehensive ROS otherwise negative.   Objective:  Physical Exam BP 113/74   Pulse 98   Wt 102 lb (46.3 kg)   LMP 08/30/2016 (Approximate)   BMI 17.51 kg/m  Gen: Thin, well appearing. NAD CV; RR Lungs: normal WOB Ext: warm   Assessment & Plan:  1. Initiation of Depo Provera - Discussed all options for contraception in tiered based fashion. Desires depo - POCT urine pregnancy - medroxyPROGESTERone (DEPO-PROVERA) 150 MG/ML injection; Inject 1 mL (150 mg total) into the muscle every 3 (three) months.  Dispense: 1 mL; Refill: 4   Routine preventative health maintenance measures emphasized. Please refer to After Visit Summary for other counseling recommendations.   Return in about 3 months (around 12/21/2016).   Total face-to-face time with patient: 15 minutes. Over 50% of encounter was spent on counseling and coordination of care.

## 2016-11-20 ENCOUNTER — Emergency Department (HOSPITAL_COMMUNITY): Payer: Medicaid Other

## 2016-11-20 ENCOUNTER — Emergency Department (HOSPITAL_COMMUNITY)
Admission: EM | Admit: 2016-11-20 | Discharge: 2016-11-20 | Disposition: A | Discharge status: Home / Self Care | Payer: Medicaid Other | Attending: Emergency Medicine | Admitting: Emergency Medicine

## 2016-11-20 ENCOUNTER — Encounter (HOSPITAL_COMMUNITY): Payer: Self-pay | Admitting: Emergency Medicine

## 2016-11-20 DIAGNOSIS — Z79899 Other long term (current) drug therapy: Secondary | ICD-10-CM | POA: Diagnosis not present

## 2016-11-20 DIAGNOSIS — M79672 Pain in left foot: Secondary | ICD-10-CM

## 2016-11-20 DIAGNOSIS — M25572 Pain in left ankle and joints of left foot: Secondary | ICD-10-CM | POA: Insufficient documentation

## 2016-11-20 DIAGNOSIS — Z87891 Personal history of nicotine dependence: Secondary | ICD-10-CM | POA: Diagnosis not present

## 2016-11-20 MED ORDER — IBUPROFEN 600 MG PO TABS: 600.0000 mg | ORAL_TABLET | Freq: Four times a day (QID) | ORAL | 0 refills | Status: DC | PRN

## 2016-11-20 NOTE — ED Provider Notes (Signed)
[facility]-EMERGENCY DEPT Provider Note   CSN: [id number] Arrival date & time: 11/20/16  1922     History   Chief Complaint Chief Complaint  Patient presents with  . Foot Pain    HPI Alexa White is a 31 y.o. female.  The history is provided by the patient and medical records. No language interpreter was used.  Foot Pain    Alexa White is a 31 y.o. female with no pertinent PMH who presents to the Emergency Department complaining of left foot pain after sitting on her foot in a wooden chair for several hours today. Pain is worse with ambulation and palpation. Denies alleviating factors. No medications taken prior to arrival for symptoms. No numbness, tingling, weakness or open wounds. Patient states she broke a bone in her foot several years ago and this feels similar, therefore worried that she may have broken and then while sitting in a chair today. She did not have any other known injury to exacerbate pain.  Past Medical History:  Diagnosis Date  . Anemia   . Former smoker    Quit- 06/01/14  . 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   Past Surgical History:  Procedure Laterality Date  . nose repair     broken nose- had to be reset  . WISDOM TOOTH EXTRACTION  2011    OB History    Gravida Para Term Preterm AB Living   SAB TAB Ectopic Multiple Live Births     1   0 2       Home Medications    Prior to Admission medications   Medication Sig Start Date End Date Taking? Authorizing Provider  ferrous sulfate 325 (65 FE) MG tablet Take 1 tablet (325 mg total) by mouth daily with breakfast. 01/24/15   Mikell, Antionette Poles, MD  ibuprofen (ADVIL,MOTRIN) 600 MG tablet Take 1 tablet (600 mg total) by mouth every 6 (six) hours as needed. 11/20/16   Ward, Chase Picket, PA-C  LO LOESTRIN FE 1 MG-10 MCG / 10 MCG tablet Take 1 tablet by mouth daily. 11/01/15   [place] Bing, MD    medroxyPROGESTERone (DEPO-PROVERA) 150 MG/ML injection Inject 1 mL (150 mg total) into the muscle every 3 (three) months. 09/20/16   Federico Flake, MD  Prenatal Vit-Fe Fumarate-FA (PRENATAL MULTIVITAMIN) TABS tablet Take 1 tablet by mouth at bedtime. 02/26/15   Reva Bores, MD    Family History Family History  Problem Relation Age of Onset  . Mental illness Mother        bipolar    Social History Social History  Substance Use Topics  . Smoking status: Former Smoker    Quit date: 06/01/2014  . Smokeless tobacco: Never Used  . Alcohol use Yes     Allergies   Patient has no known allergies.   Review of Systems Review of Systems  Musculoskeletal: Positive for arthralgias. Negative for joint swelling.  Neurological: Negative for weakness and numbness.     Physical Exam Updated Vital Signs BP 127/77 (BP Location: Left Arm)   Pulse 65   Temp 98.8 F (37.1 C) (Oral)   Resp 18   LMP 11/20/2016   SpO2 99%   Physical Exam  Constitutional: She appears well-developed and well-nourished. No distress.  HENT:  Head: Normocephalic and atraumatic.  Neck: Neck supple.  Cardiovascular: Normal rate, regular  rhythm and normal heart sounds.   No murmur heard. Pulmonary/Chest: Effort normal and breath sounds normal. No respiratory distress. She has no wheezes. She has no rales.  Musculoskeletal: Normal range of motion.       Feet:  TTP as depicted in image. No overlying skin changes. No swelling. No tenderness to palpation of either malleoli. Full range of motion. 2+ DP. Sensation intact.  Neurological: She is alert.  Skin: Skin is warm and dry.  Nursing note and vitals reviewed.    ED Treatments / Results  Labs (all labs ordered are listed, but only abnormal results are displayed) Labs Reviewed - No data to display  EKG  EKG Interpretation None       Radiology Dg Foot Complete Left  Result Date: 11/20/2016 CLINICAL DATA:  Pain, swelling and bruising for 2  days. EXAM: LEFT FOOT - COMPLETE 3+ VIEW COMPARISON:  06/29/2011 FINDINGS: The joint spaces are maintained. No acute fracture is identified. Remote healed fracture involving the fifth metatarsal neck. IMPRESSION: No acute bony findings. Electronically Signed   By: Rudie Meyer M.D.   On: 11/20/2016 21:36    Procedures Procedures (including critical care time)  Medications Ordered in ED Medications - No data to display   Initial Impression / Assessment and Plan / ED Course  I have reviewed the triage vital signs and the nursing notes.  Pertinent labs & imaging results that were available during my care of the patient were reviewed by me and considered in my medical decision making (see chart for details).    Alexa White is a 31 y.o. female who presents to ED for left foot pain after sitting on her foot in a hard chair today. NVI on exam. X-ray negative. Symptomatic home care instructions discussed. PCP follow up if no improvement.    Final Clinical Impressions(s) / ED Diagnoses   Final diagnoses:  Acute foot pain, left    New Prescriptions Discharge Medication List as of 11/20/2016  9:46 PM       Ward, Chase Picket, PA-C 11/20/16 2222    Arby Barrette, MD 11/21/16 1456

## 2016-11-20 NOTE — Discharge Instructions (Signed)
It was my pleasure taking care of you today!    Fortunately your x-ray was negative.   Ibuprofen as needed for pain. Ice affected area for additional pain/swelling relief.   Follow up with your primary care provider if symptoms do not improve in 1 week. If you do not have a primary care provider, please see the information below.  Return to ER for new or worsening symptoms, any additional concerns.  To find a primary care or specialty doctor please call [phone] or [phone] to access "[facility] Find a Doctor Service."  You may also go on the [facility] website at [email or web address]  There are also multiple [facility], [facility] and [facility] practices throughout the [place] that are frequently accepting new patients. You may find a clinic that is close to your home and contact them.  [facility] - [address] [place] [address] [phone]  [facility] in [place] (also locations in [place] and [place]) - [address] [employer] [place] [phone]  [facility] - [address] [place] [phone]

## 2016-11-20 NOTE — ED Notes (Signed)
Pt assessed and discharged by Jamie EDPA 

## 2016-11-20 NOTE — ED Triage Notes (Signed)
Patient c/o left foot pain since sitting on the foot in a hard chair today. Ambulatory.

## 2016-12-07 ENCOUNTER — Ambulatory Visit (INDEPENDENT_AMBULATORY_CARE_PROVIDER_SITE_OTHER): Payer: Medicaid Other

## 2016-12-07 VITALS — BP 131/89 | HR 72

## 2016-12-07 DIAGNOSIS — Z3042 Encounter for surveillance of injectable contraceptive: Secondary | ICD-10-CM

## 2016-12-07 MED ORDER — MEDROXYPROGESTERONE ACETATE 150 MG/ML IM SUSP
150.0000 mg | Freq: Once | INTRAMUSCULAR | Status: AC
  Administered 2016-12-07: 150 mg via INTRAMUSCULAR

## 2016-12-07 NOTE — Progress Notes (Signed)
Patient presented to the office today for her depo-provera injection. Patient received depo-provera injection in left gluteal area. Patient advised to report back to office in three months for second one.

## 2017-02-19 ENCOUNTER — Telehealth: Payer: Self-pay | Admitting: Radiology

## 2017-02-19 NOTE — Telephone Encounter (Signed)
Left message to call [id number] to see if appointment for 03/01/17 @ 3:45 can be rescheduled for that morning.

## 2017-03-01 ENCOUNTER — Ambulatory Visit: Payer: Medicaid Other

## 2018-02-08 ENCOUNTER — Encounter (HOSPITAL_COMMUNITY): Payer: Self-pay | Admitting: *Deleted

## 2018-02-08 ENCOUNTER — Inpatient Hospital Stay (HOSPITAL_COMMUNITY)
Admission: AD | Admit: 2018-02-08 | Discharge: 2018-02-08 | Disposition: A | Discharge status: Home / Self Care | Payer: Medicaid Other | Source: Ambulatory Visit | Attending: Obstetrics & Gynecology | Admitting: Obstetrics & Gynecology

## 2018-02-08 DIAGNOSIS — O210 Mild hyperemesis gravidarum: Secondary | ICD-10-CM | POA: Diagnosis not present

## 2018-02-08 DIAGNOSIS — B9689 Other specified bacterial agents as the cause of diseases classified elsewhere: Secondary | ICD-10-CM | POA: Diagnosis not present

## 2018-02-08 DIAGNOSIS — O23592 Infection of other part of genital tract in pregnancy, second trimester: Secondary | ICD-10-CM | POA: Diagnosis not present

## 2018-02-08 DIAGNOSIS — O26892 Other specified pregnancy related conditions, second trimester: Secondary | ICD-10-CM | POA: Diagnosis not present

## 2018-02-08 DIAGNOSIS — N760 Acute vaginitis: Secondary | ICD-10-CM | POA: Diagnosis not present

## 2018-02-08 DIAGNOSIS — Z3A15 15 weeks gestation of pregnancy: Secondary | ICD-10-CM | POA: Diagnosis not present

## 2018-02-08 HISTORY — DX: Major depressive disorder, single episode, unspecified: F32.9

## 2018-02-08 HISTORY — DX: Depression, unspecified: F32.A

## 2018-02-08 HISTORY — DX: Anxiety disorder, unspecified: F41.9

## 2018-02-08 LAB — WET PREP, GENITAL
Sperm: NONE SEEN
Trich, Wet Prep: NONE SEEN
Yeast Wet Prep HPF POC: NONE SEEN

## 2018-02-08 LAB — URINALYSIS, ROUTINE W REFLEX MICROSCOPIC
Bilirubin Urine: NEGATIVE
GLUCOSE, UA: NEGATIVE mg/dL
HGB URINE DIPSTICK: NEGATIVE
KETONES UR: NEGATIVE mg/dL
Leukocytes, UA: NEGATIVE
Nitrite: NEGATIVE
PH: 8 (ref 5.0–8.0)
Protein, ur: NEGATIVE mg/dL
Specific Gravity, Urine: 1.008 (ref 1.005–1.030)

## 2018-02-08 MED ORDER — ONDANSETRON 4 MG PO TBDP
4.0000 mg | ORAL_TABLET | Freq: Three times a day (TID) | ORAL | Status: DC | PRN
  Administered 2018-02-08: 4 mg via ORAL
  Filled 2018-02-08: qty 1

## 2018-02-08 MED ORDER — SCOPOLAMINE 1 MG/3DAYS TD PT72: 1.0000 | MEDICATED_PATCH | TRANSDERMAL | 1 refills | Status: DC

## 2018-02-08 MED ORDER — ONDANSETRON 4 MG PO TBDP: 4.0000 mg | ORAL_TABLET | Freq: Three times a day (TID) | ORAL | 0 refills | Status: DC | PRN

## 2018-02-08 MED ORDER — SCOPOLAMINE 1 MG/3DAYS TD PT72
1.0000 | MEDICATED_PATCH | TRANSDERMAL | Status: DC
  Administered 2018-02-08: 1.5 mg via TRANSDERMAL
  Filled 2018-02-08: qty 1

## 2018-02-08 MED ORDER — METRONIDAZOLE 500 MG PO TABS: 500.0000 mg | ORAL_TABLET | Freq: Two times a day (BID) | ORAL | 0 refills | Status: DC

## 2018-02-08 NOTE — Discharge Instructions (Signed)
Bacterial Vaginosis  Bacterial vaginosis is an infection of the vagina. It happens when too many normal germs (healthy bacteria) grow in the vagina. This infection puts you at risk for infections from sex (STIs). Treating this infection can lower your risk for some STIs. You should also treat this if you are pregnant. It can cause your baby to be born early. Follow these instructions at home: Medicines  Take over-the-counter and prescription medicines only as told by your doctor.  Take or use your antibiotic medicine as told by your doctor. Do not stop taking or using it even if you start to feel better. General instructions  If you your sexual partner is a woman, tell her that you have this infection. She needs to get treatment if she has symptoms. If you have a female partner, he does not need to be treated.  During treatment: ? Avoid sex. ? Do not douche. ? Avoid alcohol as told. ? Avoid breastfeeding as told.  Drink enough fluid to keep your pee (urine) clear or pale yellow.  Keep your vagina and butt (rectum) clean. ? Wash the area with warm water every day. ? Wipe from front to back after you use the toilet.  Keep all follow-up visits as told by your doctor. This is important. Preventing this condition  Do not douche.  Use only warm water to wash around your vagina.  Use protection when you have sex. This includes: ? Latex condoms. ? Dental dams.  Limit how many people you have sex with. It is best to only have sex with the same person (be monogamous).  Get tested for STIs. Have your partner get tested.  Wear underwear that is cotton or lined with cotton.  Avoid tight pants and pantyhose. This is most important in summer.  Do not use any products that have nicotine or tobacco in them. These include cigarettes and e-cigarettes. If you need help quitting, ask your doctor.  Do not use illegal drugs.  Limit how much alcohol you drink. Contact a doctor if:  Your  symptoms do not get better, even after you are treated.  You have more discharge or pain when you pee (urinate).  You have a fever.  You have pain in your belly (abdomen).  You have pain with sex.  Your bleed from your vagina between periods. Summary  This infection happens when too many germs (bacteria) grow in the vagina.  Treating this condition can lower your risk for some infections from sex (STIs).  You should also treat this if you are pregnant. It can cause early (premature) birth.  Do not stop taking or using your antibiotic medicine even if you start to feel better. This information is not intended to replace advice given to you by your health care provider. Make sure you discuss any questions you have with your health care provider. Document Released: 11/09/2007 Document Revised: 10/16/2015 Document Reviewed: 10/16/2015 Elsevier Interactive Patient Education  2019 [employer] Inc.   Morning Sickness  Morning sickness is when you feel sick to your stomach (nauseous) during pregnancy. You may feel sick to your stomach and throw up (vomit). You may feel sick in the morning, but you can feel this way at any time of day. Some women feel very sick to their stomach and cannot stop throwing up (hyperemesis gravidarum). Follow these instructions at home: Medicines  Take over-the-counter and prescription medicines only as told by your doctor. Do not take any medicines until you talk with your doctor about  them first.  Taking multivitamins before getting pregnant can stop or lessen the harshness of morning sickness. Eating and drinking  Eat dry toast or crackers before getting out of bed.  Eat 5 or 6 small meals a day.  Eat dry and bland foods like rice and baked potatoes.  Do not eat greasy, fatty, or spicy foods.  Have someone cook for you if the smell of food causes you to feel sick or throw up.  If you feel sick to your stomach after taking prenatal vitamins, take them  at night or with a snack.  Eat protein when you need a snack. Nuts, yogurt, and cheese are good choices.  Drink fluids throughout the day.  Try ginger ale made with real ginger, ginger tea made from fresh grated ginger, or ginger candies. General instructions  Do not use any products that have nicotine or tobacco in them, such as cigarettes and e-cigarettes. If you need help quitting, ask your doctor.  Use an air purifier to keep the air in your house free of smells.  Get lots of fresh air.  Try to avoid smells that make you feel sick.  Try: ? Wearing a bracelet that is used for seasickness (acupressure wristband). ? Going to a doctor who puts thin needles into certain body points (acupuncture) to improve how you feel. Contact a doctor if:  You need medicine to feel better.  You feel dizzy or light-headed.  You are losing weight. Get help right away if:  You feel very sick to your stomach and cannot stop throwing up.  You pass out (faint).  You have very bad pain in your belly. Summary  Morning sickness is when you feel sick to your stomach (nauseous) during pregnancy.  You may feel sick in the morning, but you can feel this way at any time of day.  Making some changes to what you eat may help your symptoms go away. This information is not intended to replace advice given to you by your health care provider. Make sure you discuss any questions you have with your health care provider. Document Released: 03/09/2004 Document Revised: 03/02/2016 Document Reviewed: 03/02/2016 Elsevier Interactive Patient Education  2019 [employer]

## 2018-02-08 NOTE — MAU Provider Note (Signed)
History     CSN: [id number]  Arrival date and time: 02/08/18 1439  Chief Complaint  Patient presents with  . Possible Pregnancy  . Nausea  . Emesis   G4P2012 @15 .1 weeks here with N/V. Sx started 1 mo ago. Vomits daily. Can tolerate liquids most of the time. Emesis x1 today. No diarrhea. Denies VB. Having thin white vaginal discharge with itching, thinks its yeast.    OB History    Gravida  4   Para  2   Term  2   Preterm      AB  1   Living  2     SAB      TAB  1   Ectopic      Multiple  0   Live Births  2           Past Medical History:  Diagnosis Date  . Anemia   . Anxiety   . Depression    PTSD  . Former smoker    Quit- 06/01/14  . 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 nose repair     broken nose- had to be reset  . WISDOM TOOTH EXTRACTION  2011    Family History  Problem Relation Age of Onset  . Mental illness Mother        bipolar    Social History   Tobacco Use  . Smoking status: Former Smoker    Last attempt to quit: 06/01/2014    Years since quitting: 3.6  . Smokeless tobacco: Never Used  Substance Use Topics  . Alcohol use: Not Currently    Comment: last used in October 2019  . Drug use: Yes    Types: Marijuana    Comment: last used in October 2019    Allergies: No Known Allergies  No medications prior to admission.    Review of Systems  Constitutional: Negative for fever.  Gastrointestinal: Positive for constipation, nausea and vomiting. Negative for abdominal pain and diarrhea.  Genitourinary: Positive for vaginal discharge. Negative for dysuria and vaginal bleeding.   Physical Exam   Blood pressure 118/73, pulse 78, temperature 98.4 F (36.9 C), temperature source Oral, resp. rate 17, height 5\' 4"  (1.626 m), weight 49 kg, last menstrual period 10/25/2017, SpO2 99 %, currently breastfeeding.  Physical Exam  Constitutional: She is oriented to  person, place, and time. She appears well-developed and well-nourished. No distress.  HENT:  Head: Normocephalic and atraumatic.  Neck: Normal range of motion.  Respiratory: Effort normal. No respiratory distress.  Musculoskeletal: Normal range of motion.  Neurological: She is alert and oriented to person, place, and time.  Skin: Skin is warm and dry.  Psychiatric: She has a normal mood and affect.  FHT 146  Results for orders placed or performed during the hospital encounter of 02/08/18 (from the past 24 hour(s))  Urinalysis, Routine w reflex microscopic     Status: Abnormal   Collection Time: 02/08/18  3:42 PM  Result Value Ref Range   Color, Urine YELLOW YELLOW   APPearance CLOUDY (A) CLEAR   Specific Gravity, Urine 1.008 1.005 - 1.030   pH 8.0 5.0 - 8.0   Glucose, UA NEGATIVE NEGATIVE mg/dL   Hgb urine dipstick NEGATIVE NEGATIVE   Bilirubin Urine NEGATIVE NEGATIVE   Ketones, ur NEGATIVE NEGATIVE mg/dL   Protein, ur NEGATIVE NEGATIVE mg/dL   Nitrite NEGATIVE NEGATIVE   Leukocytes, UA NEGATIVE NEGATIVE  Wet prep, genital     Status: Abnormal   Collection Time: 02/08/18  4:06 PM  Result Value Ref Range   Yeast Wet Prep HPF POC NONE SEEN NONE SEEN   Trich, Wet Prep NONE SEEN NONE SEEN   Clue Cells Wet Prep HPF POC PRESENT (A) NONE SEEN   WBC, Wet Prep HPF POC FEW (A) NONE SEEN   Sperm NONE SEEN    MAU Course  Procedures Zofran Scopolamine  MDM Labs ordered and reviewed. Nausea improved. No emesis. Will treat BV. Stable for discharge home.  Assessment and Plan   1. [redacted] weeks gestation of pregnancy   2. Morning sickness   3. Bacterial vaginosis    Discharge home Follow up with OB provider of choice to start care Rx Zofran- warned about constipation Rx Scop patch Rx Flagyl  Allergies as of 02/08/2018   No Known Allergies     Medication List    TAKE these medications   FLUoxetine 40 MG capsule Commonly known as:  PROZAC Take 40 mg by mouth daily.    hydrOXYzine 25 MG capsule Commonly known as:  VISTARIL Take 25 mg by mouth every 4 (four) hours as needed for anxiety.   metroNIDAZOLE 500 MG tablet Commonly known as:  FLAGYL Take 1 tablet (500 mg total) by mouth 2 (two) times daily.   multivitamin with minerals Tabs tablet Take 1 tablet by mouth daily.   ondansetron 4 MG disintegrating tablet Commonly known as:  ZOFRAN-ODT Take 1 tablet (4 mg total) by mouth every 8 (eight) hours as needed for nausea or vomiting.   scopolamine 1 MG/3DAYS Commonly known as:  TRANSDERM-SCOP Place 1 patch (1.5 mg total) onto the skin every 3 (three) days. Start taking on:  February 11, 2018      Donette LarryMelanie Audy Dauphine, [place] 02/08/2018, 6:12 PM

## 2018-02-08 NOTE — MAU Note (Signed)
Pt reports she has not been able to keep anything down for the last few days, does not have rx but a friend has been giving her zofran. LMP 10/25/2017

## 2018-02-11 LAB — GC/CHLAMYDIA PROBE AMP (~~LOC~~) NOT AT ARMC
CHLAMYDIA, DNA PROBE: NEGATIVE
NEISSERIA GONORRHEA: NEGATIVE

## 2018-02-13 NOTE — L&D Delivery Note (Addendum)
OB/GYN Faculty Practice Delivery Note  Alexa White is a 33 y.o. [id number] s/p spontaneous vaginal at [redacted]w[redacted]d. She was admitted for active labor.   GBS Status: Negative (06/04 1430) Maximum Maternal Temperature: No data recorded.   Labor Progress: . Admitted in active labor  Delivery Date/Time: 07/26/2018 at  0120 Delivery: Called to room and patient was complete and pushing. Head delivered OA;LOA. No nuchal cord present. Shoulder and body delivered in usual fashion. Infant with spontaneous cry, placed on mother's abdomen, dried and stimulated. Cord clamped x 2 after 1-minute delay, and cut by Dr. Maudie Mercury. Cord blood drawn. Placenta delivered spontaneously with gentle cord traction. Fundus firm with massage and Pitocin. Labia, perineum, vagina, and cervix inspected with minimal first degree lacerations periurethral and vaginal that approximated well and did not require repair.   Placenta: spontaneous , intact  Complications: GDM Lacerations: first degree, periurethral and vaginal EBL: 58 mL  Analgesia:  none  Postpartum Planning Mom and baby to mother baby . Lactation consult . Boy - circ? . AM CBC . Contraception: BTL, papers signed and in chart    . Social work: + THC    Infant: Viable female  Wilkinson, M.D.  07/26/2018 2:08 AM  I confirm that I have verified the information documented in the resident's note and that I have also personally reperformed the physical exam and all medical decision making activities.  I was gloved and present for entire delivery SVD without incident No difficulty with shoulders Lacerations as listed above Repair of same supervised by me Seabron Spates, CNM

## 2018-02-20 ENCOUNTER — Ambulatory Visit (INDEPENDENT_AMBULATORY_CARE_PROVIDER_SITE_OTHER): Payer: Medicaid Other | Admitting: Advanced Practice Midwife

## 2018-02-20 ENCOUNTER — Encounter: Payer: Self-pay | Admitting: Advanced Practice Midwife

## 2018-02-20 ENCOUNTER — Other Ambulatory Visit (HOSPITAL_COMMUNITY)
Admission: RE | Admit: 2018-02-20 | Discharge: 2018-02-20 | Disposition: A | Discharge status: Home / Self Care | Payer: Medicaid Other | Source: Ambulatory Visit | Attending: Advanced Practice Midwife | Admitting: Advanced Practice Midwife

## 2018-02-20 DIAGNOSIS — Z3480 Encounter for supervision of other normal pregnancy, unspecified trimester: Secondary | ICD-10-CM

## 2018-02-20 DIAGNOSIS — Z3402 Encounter for supervision of normal first pregnancy, second trimester: Secondary | ICD-10-CM | POA: Diagnosis not present

## 2018-02-20 DIAGNOSIS — O0993 Supervision of high risk pregnancy, unspecified, third trimester: Secondary | ICD-10-CM | POA: Insufficient documentation

## 2018-02-20 NOTE — Progress Notes (Signed)
Last pap 10/2015- normal Flu injection  Decline baby scripts due to phone

## 2018-02-20 NOTE — Patient Instructions (Signed)

## 2018-02-20 NOTE — Progress Notes (Signed)
Subjective:   Alexa White is a 33 y.o. [id number] at [redacted]w[redacted]d by LMP being seen today for her first obstetrical visit.  Her obstetrical history is significant for smoking and marijuana use. Patient does not intend to breast feed. Pregnancy history fully reviewed, term SVD x 2 without complications.  Patient reports nausea and vomiting. She was prescribed Zofran and Scopolamine patches at her MAU visit 02/08/18 but has not started those medications yet. She plans to pick up and start the Zofran today but cannot afford the Scopolamine patch.  HISTORY: OB History  Gravida Para Term Preterm AB Living  4 2 2  0 1 2  SAB TAB Ectopic Multiple Live Births  0 1 0 0 2    # Outcome Date GA Lbr Len/2nd Weight Sex Delivery Anes PTL Lv  4 Current           3 Term 01/22/15 [redacted]w[redacted]d 09:01 / 00:15 7 lb 3.7 oz (3.28 kg) F Vag-Spont EPI  LIV     Name: Alexa White     Apgar1: 9  Apgar5: 10  2 TAB 2011          1 Term 09/11/05   7 lb 13 oz (3.544 kg) M Vag-Spont   LIV    Last pap smear was done 11/01/2015 and was normal  Past Medical History:  Diagnosis Date  . Anemia   . Anxiety   . Depression    PTSD  . Former smoker    Quit- 06/01/14  . 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 nose repair     broken nose- had to be reset  . WISDOM TOOTH EXTRACTION  2011   Family History  Problem Relation Age of Onset  . Mental illness Mother        bipolar   Social History   Tobacco Use  . Smoking status: Former Smoker    Last attempt to quit: 06/01/2014    Years since quitting: 3.7  . Smokeless tobacco: Never Used  Substance Use Topics  . Alcohol use: Not Currently    Comment: last used in October 2019  . Drug use: Yes    Types: Marijuana    Comment: last used in October 2019   No Known Allergies Current Outpatient Medications on File Prior to Visit  Medication Sig Dispense Refill  . FLUoxetine (PROZAC) 40 MG capsule Take  40 mg by mouth daily.    . metroNIDAZOLE (FLAGYL) 500 MG tablet Take 1 tablet (500 mg total) by mouth 2 (two) times daily. 14 tablet 0  . Multiple Vitamin (MULTIVITAMIN WITH MINERALS) TABS tablet Take 1 tablet by mouth daily.    . ondansetron (ZOFRAN-ODT) 4 MG disintegrating tablet Take 1 tablet (4 mg total) by mouth every 8 (eight) hours as needed for nausea or vomiting. 20 tablet 0  . hydrOXYzine (VISTARIL) 25 MG capsule Take 25 mg by mouth every 4 (four) hours as needed for anxiety.    Marland Kitchen scopolamine (TRANSDERM-SCOP) 1 MG/3DAYS Place 1 patch (1.5 mg total) onto the skin every 3 (three) days. (Patient not taking: Reported on 02/20/2018) 10 patch 1   No current facility-administered medications on file prior to visit.     Review of Systems Pertinent items noted in HPI and remainder of comprehensive ROS otherwise negative.  Exam   Vitals:   02/20/18 1432  BP: 105/68  Pulse: 72  Weight: 109 lb 12.8 oz (  49.8 kg)   Fetal Heart Rate (bpm): 134 by Doppler  Uterus:     Pelvic Exam: Perineum: no hemorrhoids, normal perineum   Vulva: normal external genitalia, no lesions   Vagina:  normal mucosa, thick white vaginal discharge noted   Cervix: no lesions and normal, pap smear done.    Adnexa: normal adnexa and no mass, fullness, tenderness   Bony Pelvis: average  System: General: well-developed, well-nourished female in no acute distress   Breasts:  normal appearance, no masses or tenderness bilaterally   Skin: normal coloration and turgor, no rashes   Neurologic: oriented, normal, negative, normal mood   Extremities: normal strength, tone, and muscle mass, ROM of all joints is normal   HEENT PERRLA, extraocular movement intact and sclera clear, anicteric   Mouth/Teeth mucous membranes moist, pharynx normal without lesions and dental hygiene good   Neck supple and no masses   Cardiovascular: regular rate and rhythm   Respiratory:  no respiratory distress, normal breath sounds   Abdomen:  soft, non-tender; bowel sounds normal; no masses,  no organomegaly   Assessment:   Pregnancy: [id number] Patient Active Problem List   Diagnosis Date Noted  . Supervision of other normal pregnancy, antepartum 02/20/2018     Plan:  1. Encounter for supervision of normal first pregnancy in second trimester - No concerns, continue routine care - Initiate Zofran as prescribed  - Cytology - PAP( [place]) - US MFM OB COMP + 14 WK; Future  Pap collected Continue prenatal vitamins. Genetic Screening discussed, signed and held for future collection as discussed above Ultrasound discussed; fetal anatomic survey: ordered. Problem list reviewed and updated. The nature [facility] with multiple MDs and other Advanced Practice Providers was explained to patient; also emphasized that residents, students are part of our team. Routine obstetric precautions reviewed. Return in about 1 week (around 02/27/2018) for new ob lab collection, return in 4 weeks for ROB  Patient's insurance coverage begins tomorrow. Only Pap was collected during today's visit. Remaining labs deferred. She will return to clinic within seven days to have New OB labs drawn.  Clayton BiblesSamantha Weinhold, Certified Nurse Midwife [facility] for [facility], [facility]

## 2018-02-22 LAB — CYTOLOGY - PAP
CANDIDA VAGINITIS: POSITIVE — AB
CHLAMYDIA, DNA PROBE: NEGATIVE
DIAGNOSIS: NEGATIVE
HPV: NOT DETECTED
NEISSERIA GONORRHEA: NEGATIVE
Trichomonas: NEGATIVE

## 2018-02-25 ENCOUNTER — Other Ambulatory Visit: Payer: Self-pay | Admitting: *Deleted

## 2018-02-25 MED ORDER — TERCONAZOLE 0.8 % VA CREA: 1.0000 | TOPICAL_CREAM | Freq: Every day | VAGINAL | 0 refills | Status: DC

## 2018-02-26 ENCOUNTER — Inpatient Hospital Stay (HOSPITAL_COMMUNITY)
Admission: AD | Admit: 2018-02-26 | Discharge: 2018-02-26 | Disposition: A | Discharge status: Home / Self Care | Payer: Medicaid Other | Source: Ambulatory Visit | Attending: Obstetrics & Gynecology | Admitting: Obstetrics & Gynecology

## 2018-02-26 ENCOUNTER — Encounter (HOSPITAL_COMMUNITY): Payer: Self-pay | Admitting: *Deleted

## 2018-02-26 ENCOUNTER — Other Ambulatory Visit: Payer: Self-pay

## 2018-02-26 DIAGNOSIS — Z87891 Personal history of nicotine dependence: Secondary | ICD-10-CM | POA: Insufficient documentation

## 2018-02-26 DIAGNOSIS — Z3A17 17 weeks gestation of pregnancy: Secondary | ICD-10-CM | POA: Diagnosis not present

## 2018-02-26 DIAGNOSIS — O219 Vomiting of pregnancy, unspecified: Secondary | ICD-10-CM | POA: Diagnosis not present

## 2018-02-26 DIAGNOSIS — O210 Mild hyperemesis gravidarum: Secondary | ICD-10-CM | POA: Diagnosis not present

## 2018-02-26 DIAGNOSIS — N949 Unspecified condition associated with female genital organs and menstrual cycle: Secondary | ICD-10-CM

## 2018-02-26 DIAGNOSIS — R109 Unspecified abdominal pain: Secondary | ICD-10-CM | POA: Diagnosis present

## 2018-02-26 DIAGNOSIS — R102 Pelvic and perineal pain: Secondary | ICD-10-CM | POA: Insufficient documentation

## 2018-02-26 LAB — URINALYSIS, ROUTINE W REFLEX MICROSCOPIC
Bacteria, UA: NONE SEEN
Bilirubin Urine: NEGATIVE
GLUCOSE, UA: NEGATIVE mg/dL
Hgb urine dipstick: NEGATIVE
Ketones, ur: NEGATIVE mg/dL
Nitrite: NEGATIVE
PH: 8 (ref 5.0–8.0)
Protein, ur: NEGATIVE mg/dL
SPECIFIC GRAVITY, URINE: 1.012 (ref 1.005–1.030)

## 2018-02-26 MED ORDER — LACTATED RINGERS IV BOLUS
1000.0000 mL | Freq: Once | INTRAVENOUS | Status: AC
  Administered 2018-02-26: 1000 mL via INTRAVENOUS

## 2018-02-26 MED ORDER — PROMETHAZINE HCL 25 MG PO TABS: 12.5000 mg | ORAL_TABLET | Freq: Four times a day (QID) | ORAL | 0 refills | Status: DC | PRN

## 2018-02-26 MED ORDER — PROMETHAZINE HCL 25 MG/ML IJ SOLN
25.0000 mg | Freq: Once | INTRAMUSCULAR | Status: AC
  Administered 2018-02-26: 25 mg via INTRAVENOUS
  Filled 2018-02-26: qty 1

## 2018-02-26 NOTE — Discharge Instructions (Signed)
Round Ligament Pain during Pregnancy Many women will experience a type of pain referred to as round ligament pain during their pregnancy. This is associated with abdominal pain or discomfort. Since any type of abdominal pain during pregnancy can be disconcerting, it is important to talk about round ligament pain to relieve any anxiety or fears you may have regarding the symptoms you are feeling. Round ligament pain is due to normal changes that take place in the body during pregnancy. It is caused by stretching of the round ligaments attached to the uterus. More commonly it occurs on the right side of the pelvis. Round Ligament: An Overview Typically in the non-pregnant state the uterus is about the size of an apple or pear. There are thick ligaments which hold the uterus in place in the abdomen, referred to as round ligaments. During pregnancy, your uterus will expand in size and weight, and the ligaments supporting it will have to stretch, becoming longer and thinner. As these ligaments pull and tug they may irritate nearby nerve fibers, which causes pain. The severity of the pain in some cases can seem extreme. Some common symptoms of round ligament pain include:  Ligament spasms or contractions/cramps that trigger a sharp pain typically on the right side of the abdomen.  Pain upon waking or suddenly rolling over in your sleep.  Pain in the abdomen that is sharp brought on by exercise or other vigorous activity. Similar Problems Round ligament pain is often mistaken for other medical conditions because the symptoms are similar. Acute abdominal pain during pregnancy may also be a sign of other conditions including:  Abdominal cramps - Some abdominal pain is simply caused by change in bowel habits associated with pregnancy. Gas is a common problem that can cause sharp, shooting pain. You should always seek out medical care if your pain is accompanied by fever, chills, pain  upon urination or if you have difficulty walking. Further exams and tests will be conducted to ensure that you do not have a more serious condition. It is not uncommon for women with lower abdominal pain to have a urinary tract infection, thus you may also be asked for a urine sample. Treatment If all other conditions are ruled out you can treat your round ligament pain relatively easily. You may be advised to take some acetaminophen (Tylenol) to reduce the severity of any persistent pain and asked to reduce your activity level. You can apply a heating pad to the area of pain or take a warm bath. Lying on the opposite side of the pain may help as well. Most women will find relief from round ligament pain simply by altering their daily routines slightly. The good news is round ligament pain will disappear completely once you have given birth to your child!   Morning Sickness  Morning sickness is when you feel sick to your stomach (nauseous) during pregnancy. You may feel sick to your stomach and throw up (vomit). You may feel sick in the morning, but you can feel this way at any time of day. Some women feel very sick to their stomach and cannot stop throwing up (hyperemesis gravidarum). Follow these instructions at home: Medicines  Take over-the-counter and prescription medicines only as told by your doctor. Do not take any medicines until you talk with your doctor about them first.  Taking multivitamins before getting pregnant can stop or lessen the harshness of morning sickness. Eating and drinking  Eat dry toast or crackers before getting out of bed.  Eat 5 or 6 small meals a day.  Eat dry and bland foods like rice and baked potatoes.  Do not eat greasy, fatty, or spicy foods.  Have someone cook for you if the smell of food causes you to feel sick or throw up.  If you feel sick to your stomach after taking prenatal vitamins, take them at night or with a snack.  Eat protein when  you need a snack. Nuts, yogurt, and cheese are good choices.  Drink fluids throughout the day.  Try ginger ale made with real ginger, ginger tea made from fresh grated ginger, or ginger candies. General instructions  Do not use any products that have nicotine or tobacco in them, such as cigarettes and e-cigarettes. If you need help quitting, ask your doctor.  Use an air purifier to keep the air in your house free of smells.  Get lots of fresh air.  Try to avoid smells that make you feel sick.  Try: ? Wearing a bracelet that is used for seasickness (acupressure wristband). ? Going to a doctor who puts thin needles into certain body points (acupuncture) to improve how you feel. Contact a doctor if:  You need medicine to feel better.  You feel dizzy or light-headed.  You are losing weight. Get help right away if:  You feel very sick to your stomach and cannot stop throwing up.  You pass out (faint).  You have very bad pain in your belly. Summary  Morning sickness is when you feel sick to your stomach (nauseous) during pregnancy.  You may feel sick in the morning, but you can feel this way at any time of day.  Making some changes to what you eat may help your symptoms go away. This information is not intended to replace advice given to you by your health care provider. Make sure you discuss any questions you have with your health care provider. Document Released: 03/09/2004 Document Revised: 03/02/2016 Document Reviewed: 03/02/2016 Elsevier Interactive Patient Education  2019 [employer].

## 2018-02-26 NOTE — MAU Note (Signed)
Pt presents to MAU with complaints of nausea more than normal with lower abdominal cramping, SOB

## 2018-02-26 NOTE — MAU Provider Note (Signed)
History     CSN: [id number]  Arrival date and time: 02/26/18 1631   First Provider Initiated Contact with Patient 02/26/18 1727      Chief Complaint  Patient presents with  . Emesis  . Abdominal Pain   Alexa White is a 33 y.o. [id number] at 6254w5d who presents today with nausea/vomiting and abdominal pain. This has been an ongoing issue during the pregnancy. She was given scopolamine patches and zofran, but has not been able to afford those medications. She states that some days she will vomit 6+ times per day. She is also having some abdominal pain. She denies any VB or LOF.   Emesis   This is a new problem. The current episode started 1 to 4 weeks ago. The problem occurs 5 to 10 times per day. The problem has been unchanged. The emesis has an appearance of stomach contents. There has been no fever. Associated symptoms include abdominal pain. Pertinent negatives include no chills or fever. Risk factors: Pregnancy  She has tried nothing for the symptoms. The treatment provided no relief.  Abdominal Pain  This is a new problem. The current episode started 1 to 4 weeks ago. The onset quality is gradual. The problem occurs intermittently. The problem has been unchanged. The pain is located in the LLQ and RLQ. The pain is mild. The quality of the pain is dull. The abdominal pain does not radiate. Associated symptoms include vomiting. Pertinent negatives include no dysuria, fever or frequency. Nothing aggravates the pain. The pain is relieved by nothing. She has tried nothing for the symptoms.    OB History    Gravida  4   Para  2   Term  2   Preterm      AB  1   Living  2     SAB      TAB  1   Ectopic      Multiple  0   Live Births  2           Past Medical History:  Diagnosis Date  . Anemia   . Anxiety   . Depression    PTSD  . Former smoker    Quit- 06/01/14  . Marijuana use 2016   Positive x4 prenatally    Past Surgical History:  Procedure Laterality  Date  . nose repair     broken nose- had to be reset  . WISDOM TOOTH EXTRACTION  2011    Family History  Problem Relation Age of Onset  . Mental illness Mother        bipolar    Social History   Tobacco Use  . Smoking status: Former Smoker    Last attempt to quit: 06/01/2014    Years since quitting: 3.7  . Smokeless tobacco: Never Used  Substance Use Topics  . Alcohol use: Not Currently    Comment: last used in October 2019  . Drug use: Yes    Types: Marijuana    Comment: last used in October 2019    Allergies: No Known Allergies  Medications Prior to Admission  Medication Sig Dispense Refill Last Dose  . FLUoxetine (PROZAC) 40 MG capsule Take 40 mg by mouth daily.   Taking  . hydrOXYzine (VISTARIL) 25 MG capsule Take 25 mg by mouth every 4 (four) hours as needed for anxiety.   Not Taking  . metroNIDAZOLE (FLAGYL) 500 MG tablet Take 1 tablet (500 mg total) by mouth 2 (two) times daily. 14 tablet  0 Taking  . Multiple Vitamin (MULTIVITAMIN WITH MINERALS) TABS tablet Take 1 tablet by mouth daily.   Taking  . ondansetron (ZOFRAN-ODT) 4 MG disintegrating tablet Take 1 tablet (4 mg total) by mouth every 8 (eight) hours as needed for nausea or vomiting. 20 tablet 0 Taking  . scopolamine (TRANSDERM-SCOP) 1 MG/3DAYS Place 1 patch (1.5 mg total) onto the skin every 3 (three) days. (Patient not taking: Reported on 02/20/2018) 10 patch 1 Not Taking  . terconazole (TERAZOL 3) 0.8 % vaginal cream Place 1 applicator vaginally at bedtime. Apply nightly for three nights. 20 g 0     Review of Systems  Constitutional: Negative for chills and fever.  Gastrointestinal: Positive for abdominal pain and vomiting.  Genitourinary: Negative for dysuria, frequency, pelvic pain, urgency, vaginal bleeding and vaginal discharge.   Physical Exam   Blood pressure 123/63, pulse 81, temperature 98.4 F (36.9 C), temperature source Oral, resp. rate 16, height 5\' 4"  (1.626 m), weight 49.9 kg, last menstrual  period 10/25/2017, SpO2 100 %, currently breastfeeding.  Physical Exam  Nursing note and vitals reviewed. Constitutional: She is oriented to person, place, and time. She appears well-developed and well-nourished.  Cardiovascular: Normal rate.  Respiratory: Effort normal.  GI: Soft. There is no abdominal tenderness. There is no rebound.  Genitourinary:    Genitourinary Comments: +FHT 145 with doppler    Neurological: She is alert and oriented to person, place, and time.  Skin: Skin is warm and dry.  Psychiatric: She has a normal mood and affect.     MAU Course  Procedures  MDM Patient has had 1L of LR and 25mg  of phenergan. She reports she is feeling better.  Assessment and Plan   1. Nausea/vomiting in pregnancy   2. Round ligament pain   3. [redacted] weeks gestation of pregnancy    DC home Comfort measures reviewed  2nd Trimester precautions  PTL precautions  Fetal kick counts RX: phenergan PRN #[id number]  Return to MAU as needed FU with OB as planned  Follow-up Information    [facility].   Specialty:  Obstetrics and Gynecology Contact information: [address] [place] [id number] [phone]           Thressa ShellerHeather  DNP, CNM  02/26/18  5:40 PM

## 2018-02-28 ENCOUNTER — Other Ambulatory Visit: Payer: Medicaid Other

## 2018-02-28 ENCOUNTER — Encounter (HOSPITAL_COMMUNITY): Payer: Self-pay

## 2018-02-28 NOTE — Addendum Note (Signed)
Addended by: Cheree Ditto, DEMETRICE A on: 02/28/2018 03:52 PM   Modules accepted: Orders

## 2018-02-28 NOTE — Addendum Note (Signed)
Addended by: Cheree Ditto, DEMETRICE A on: 02/28/2018 03:30 PM   Modules accepted: Orders

## 2018-03-01 ENCOUNTER — Telehealth: Payer: Self-pay | Admitting: *Deleted

## 2018-03-01 LAB — COMPREHENSIVE METABOLIC PANEL
ALBUMIN: 3.6 g/dL (ref 3.5–5.5)
ALT: 19 IU/L (ref 0–32)
AST: 16 IU/L (ref 0–40)
Albumin/Globulin Ratio: 1.4 (ref 1.2–2.2)
Alkaline Phosphatase: 48 IU/L (ref 39–117)
BUN/Creatinine Ratio: 24 — ABNORMAL HIGH (ref 9–23)
BUN: 9 mg/dL (ref 6–20)
Bilirubin Total: <0.2 mg/dL (ref 0.0–1.2)
CALCIUM: 8.8 mg/dL (ref 8.7–10.2)
CO2: 21 mmol/L (ref 20–29)
CREATININE: 0.38 mg/dL — AB (ref 0.57–1.00)
Chloride: 103 mmol/L (ref 96–106)
GFR, EST AFRICAN AMERICAN: 164 mL/min/{1.73_m2} (ref >59)
GFR, EST NON AFRICAN AMERICAN: 143 mL/min/{1.73_m2} (ref >59)
GLOBULIN, TOTAL: 2.5 g/dL (ref 1.5–4.5)
GLUCOSE: 59 mg/dL — AB (ref 65–99)
Potassium: 3.8 mmol/L (ref 3.5–5.2)
Sodium: 138 mmol/L (ref 134–144)
TOTAL PROTEIN: 6.1 g/dL (ref 6.0–8.5)

## 2018-03-01 LAB — OBSTETRIC PANEL, INCLUDING HIV
Antibody Screen: NEGATIVE
BASOS ABS: 0 10*3/uL (ref 0.0–0.2)
BASOS: 0 %
EOS (ABSOLUTE): 0.2 10*3/uL (ref 0.0–0.4)
EOS: 2 %
HEP B S AG: NEGATIVE
HIV Screen 4th Generation wRfx: NONREACTIVE
Hematocrit: 31.1 % — ABNORMAL LOW (ref 34.0–46.6)
Hemoglobin: 11 g/dL — ABNORMAL LOW (ref 11.1–15.9)
IMMATURE GRANS (ABS): 0.1 10*3/uL (ref 0.0–0.1)
Immature Granulocytes: 1 %
LYMPHS ABS: 2.3 10*3/uL (ref 0.7–3.1)
Lymphs: 28 %
MCH: 33.8 pg — AB (ref 26.6–33.0)
MCHC: 35.4 g/dL (ref 31.5–35.7)
MCV: 96 fL (ref 79–97)
MONOS ABS: 0.8 10*3/uL (ref 0.1–0.9)
Monocytes: 10 %
NEUTROS ABS: 4.7 10*3/uL (ref 1.4–7.0)
NEUTROS PCT: 59 %
Platelets: 308 10*3/uL (ref 150–450)
RBC: 3.25 x10E6/uL — ABNORMAL LOW (ref 3.77–5.28)
RDW: 11.7 % (ref 11.7–15.4)
RPR Ser Ql: NONREACTIVE
Rh Factor: POSITIVE
Rubella Antibodies, IGG: 2.32 index (ref >0.99)
WBC: 8.1 10*3/uL (ref 3.4–10.8)

## 2018-03-01 NOTE — Telephone Encounter (Signed)
-----   Message from Samantha C Weinhold, CNM sent at 03/01/2018  7:58 AM EST ----- Pap positive for vulvovaginal candidiasis. Rx Terazol vaginal cream. Message clinic to coordinate and notify pt. 

## 2018-03-01 NOTE — Telephone Encounter (Signed)
Left message for patient to call back for her Pap results

## 2018-03-01 NOTE — Telephone Encounter (Signed)
-----   Message from Calvert Cantor, [place] sent at 03/01/2018  7:58 AM EST ----- Pap positive for vulvovaginal candidiasis. Rx Terazol vaginal cream. Message clinic to coordinate and notify pt.

## 2018-03-01 NOTE — Telephone Encounter (Signed)
Pt called back, informed of her pap results and medication sent to the pharmacy.

## 2018-03-02 LAB — URINE CULTURE, OB REFLEX: Organism ID, Bacteria: NO GROWTH

## 2018-03-02 LAB — CULTURE, OB URINE

## 2018-03-07 ENCOUNTER — Ambulatory Visit (HOSPITAL_COMMUNITY)
Admission: RE | Admit: 2018-03-07 | Discharge: 2018-03-07 | Disposition: A | Discharge status: Home / Self Care | Payer: Medicaid Other | Source: Ambulatory Visit | Attending: Advanced Practice Midwife | Admitting: Advanced Practice Midwife

## 2018-03-07 DIAGNOSIS — Z3A17 17 weeks gestation of pregnancy: Secondary | ICD-10-CM

## 2018-03-07 DIAGNOSIS — Z363 Encounter for antenatal screening for malformations: Secondary | ICD-10-CM | POA: Diagnosis not present

## 2018-03-07 DIAGNOSIS — Z3402 Encounter for supervision of normal first pregnancy, second trimester: Secondary | ICD-10-CM | POA: Insufficient documentation

## 2018-03-07 DIAGNOSIS — Z3687 Encounter for antenatal screening for uncertain dates: Secondary | ICD-10-CM

## 2018-03-07 LAB — CYSTIC FIBROSIS MUTATION 97: Interpretation: NOT DETECTED

## 2018-03-08 ENCOUNTER — Other Ambulatory Visit (HOSPITAL_COMMUNITY): Payer: Self-pay | Admitting: *Deleted

## 2018-03-08 DIAGNOSIS — Z362 Encounter for other antenatal screening follow-up: Secondary | ICD-10-CM

## 2018-03-12 ENCOUNTER — Telehealth: Payer: Self-pay | Admitting: Radiology

## 2018-03-12 ENCOUNTER — Encounter: Payer: Self-pay | Admitting: Radiology

## 2018-03-19 ENCOUNTER — Telehealth: Payer: Self-pay

## 2018-03-19 NOTE — Telephone Encounter (Signed)
Call patient regarding lab work. I have left a message for her to call us back.

## 2018-03-20 ENCOUNTER — Ambulatory Visit (INDEPENDENT_AMBULATORY_CARE_PROVIDER_SITE_OTHER): Payer: Medicaid Other | Admitting: Advanced Practice Midwife

## 2018-03-20 VITALS — BP 113/70 | HR 72 | Wt 116.2 lb

## 2018-03-20 DIAGNOSIS — Z3482 Encounter for supervision of other normal pregnancy, second trimester: Secondary | ICD-10-CM

## 2018-03-20 DIAGNOSIS — Z23 Encounter for immunization: Secondary | ICD-10-CM

## 2018-03-20 DIAGNOSIS — Z3689 Encounter for other specified antenatal screening: Secondary | ICD-10-CM

## 2018-03-20 DIAGNOSIS — Z3480 Encounter for supervision of other normal pregnancy, unspecified trimester: Secondary | ICD-10-CM

## 2018-03-20 NOTE — Progress Notes (Signed)
FLU SHOT TODAY

## 2018-03-20 NOTE — Progress Notes (Signed)
   PRENATAL VISIT NOTE  Subjective:  Alexa White is a 33 y.o. [phone] at [redacted]w[redacted]d being seen today for ongoing prenatal care.  She is currently monitored for the following issues for this low-risk pregnancy and has Supervision of other normal pregnancy, antepartum on their problem list.  Patient reports no complaints. She continues to have intermittent episodes of nausea and vomiting.  Contractions: Not present.  .  Movement: Present. Denies leaking of fluid.   The following portions of the patient's history were reviewed and updated as appropriate: allergies, current medications, past family history, past medical history, past social history, past surgical history and problem list. Problem list updated.  Objective:   Vitals:   03/20/18 1511  BP: 113/70  Pulse: 72  Weight: 116 lb 3.2 oz (52.7 kg)    Fetal Status: Fetal Heart Rate (bpm): 147   Movement: Present     General:  Alert, oriented and cooperative. Patient is in no acute distress.  Skin: Skin is warm and dry. No rash noted.   Cardiovascular: Normal heart rate noted  Respiratory: Normal respiratory effort, no problems with respiration noted  Abdomen: Soft, gravid, appropriate for gestational age.  Pain/Pressure: Absent     Pelvic: Cervical exam deferred        Extremities: Normal range of motion.  Edema: None  Mental Status: Normal mood and affect. Normal behavior. Normal judgment and thought content.   Assessment and Plan:  Pregnancy: [id number] at [redacted]w[redacted]d  1. Supervision of other normal pregnancy, antepartum --No complaints or concerns --Residual nausea and vomiting, weight up 7lbs from visit 02/20/2018   2. Establish gestational age, ultrasound --MFM scan 03/07/2018 changed due date to 08/11/2018. Pregnancy episode updated  Preterm labor symptoms and general obstetric precautions including but not limited to vaginal bleeding, contractions, leaking of fluid and fetal movement were reviewed in detail with the  patient. Please refer to After Visit Summary for other counseling recommendations.  Return in about 4 weeks (around 04/17/2018).  Future Appointments  Date Time Provider Department Center  04/05/2018  3:15 PM [facility] [place] 4 [facility]  04/17/2018  3:00 PM Anyanwu, Jethro Bastos, MD [facility]    Calvert Cantor, CNM

## 2018-03-31 ENCOUNTER — Other Ambulatory Visit (HOSPITAL_COMMUNITY): Payer: Self-pay | Admitting: Certified Nurse Midwife

## 2018-04-05 ENCOUNTER — Ambulatory Visit (HOSPITAL_COMMUNITY)
Admission: RE | Admit: 2018-04-05 | Discharge: 2018-04-05 | Disposition: A | Discharge status: Home / Self Care | Payer: Medicaid Other | Source: Ambulatory Visit | Attending: Advanced Practice Midwife | Admitting: Advanced Practice Midwife

## 2018-04-05 DIAGNOSIS — Z362 Encounter for other antenatal screening follow-up: Secondary | ICD-10-CM | POA: Diagnosis not present

## 2018-04-05 DIAGNOSIS — Z3A21 21 weeks gestation of pregnancy: Secondary | ICD-10-CM

## 2018-04-15 ENCOUNTER — Other Ambulatory Visit: Payer: Self-pay | Admitting: *Deleted

## 2018-04-15 MED ORDER — ONDANSETRON 4 MG PO TBDP: 4.0000 mg | ORAL_TABLET | Freq: Three times a day (TID) | ORAL | 0 refills | Status: DC | PRN

## 2018-04-17 ENCOUNTER — Encounter: Payer: Medicaid Other | Admitting: Obstetrics & Gynecology

## 2018-04-17 NOTE — Progress Notes (Deleted)
   Patient did not show up today for her scheduled appointment.   Vash Quezada, MD, FACOG Obstetrician & Gynecologist, [facility], [facility]  

## 2018-04-22 ENCOUNTER — Telehealth: Payer: Self-pay

## 2018-04-22 NOTE — Telephone Encounter (Signed)
Received call from patient stating she is having some dental problems and thinks she may have a crack tooth. I have advised her to call around to local dentist to see who could see her and maybe set up a payment plan since she does not have insurance for dental care. Patient voice understanding.

## 2018-05-01 ENCOUNTER — Other Ambulatory Visit: Payer: Self-pay

## 2018-05-01 ENCOUNTER — Ambulatory Visit (INDEPENDENT_AMBULATORY_CARE_PROVIDER_SITE_OTHER): Payer: Medicaid Other | Admitting: Advanced Practice Midwife

## 2018-05-01 VITALS — BP 118/75 | HR 72 | Wt 125.8 lb

## 2018-05-01 DIAGNOSIS — M549 Dorsalgia, unspecified: Secondary | ICD-10-CM

## 2018-05-01 DIAGNOSIS — O9989 Other specified diseases and conditions complicating pregnancy, childbirth and the puerperium: Secondary | ICD-10-CM

## 2018-05-01 DIAGNOSIS — F419 Anxiety disorder, unspecified: Secondary | ICD-10-CM

## 2018-05-01 DIAGNOSIS — O99891 Other specified diseases and conditions complicating pregnancy: Secondary | ICD-10-CM

## 2018-05-01 DIAGNOSIS — O26892 Other specified pregnancy related conditions, second trimester: Secondary | ICD-10-CM

## 2018-05-01 DIAGNOSIS — N949 Unspecified condition associated with female genital organs and menstrual cycle: Secondary | ICD-10-CM

## 2018-05-01 DIAGNOSIS — Z3480 Encounter for supervision of other normal pregnancy, unspecified trimester: Secondary | ICD-10-CM

## 2018-05-01 DIAGNOSIS — Z3A25 25 weeks gestation of pregnancy: Secondary | ICD-10-CM

## 2018-05-01 DIAGNOSIS — O99342 Other mental disorders complicating pregnancy, second trimester: Secondary | ICD-10-CM

## 2018-05-01 DIAGNOSIS — F329 Major depressive disorder, single episode, unspecified: Secondary | ICD-10-CM

## 2018-05-01 MED ORDER — METOCLOPRAMIDE HCL 10 MG PO TABS: 10.0000 mg | ORAL_TABLET | Freq: Four times a day (QID) | ORAL | 2 refills | Status: DC | PRN

## 2018-05-01 MED ORDER — CYCLOBENZAPRINE HCL 10 MG PO TABS: 10.0000 mg | ORAL_TABLET | Freq: Three times a day (TID) | ORAL | 1 refills | Status: DC | PRN

## 2018-05-01 NOTE — Patient Instructions (Signed)

## 2018-05-01 NOTE — Progress Notes (Signed)
Patient is taking amoxicillin for infected tooth.

## 2018-05-01 NOTE — Progress Notes (Signed)
   PRENATAL VISIT NOTE  Subjective:  Alexa White is a 33 y.o. [phone] at [redacted]w[redacted]d being seen today for ongoing prenatal care.  She is currently monitored for the following issues for this low-risk pregnancy and has Supervision of other normal pregnancy, antepartum on their problem list.  Patient reports recurrent nausea and vomiting. Not consistently taking prescribed medications. Denies scent triggers. .  Contractions: Not present. Vag. Bleeding: None.  Movement: Present. Denies leaking of fluid.   The following portions of the patient's history were reviewed and updated as appropriate: allergies, current medications, past family history, past medical history, past social history, past surgical history and problem list. Problem list updated.  Objective:    Vitals:   05/01/18 1531  BP: 118/75  Pulse: 72  Weight: 125 lb 12.8 oz (57.1 kg)    Fetal Status: Fetal Heart Rate (bpm): 135 Fundal Height: 27 cm Movement: Present     General:  Alert, oriented and cooperative. Patient is in no acute distress.  Skin: Skin is warm and dry. No rash noted.   Cardiovascular: Normal heart rate noted  Respiratory: Normal respiratory effort, no problems with respiration noted  Abdomen: Soft, gravid, appropriate for gestational age.  Pain/Pressure: Absent     Pelvic: Cervical exam deferred        Extremities: Normal range of motion.  Edema: None  Mental Status: Normal mood and affect. Normal behavior. Normal judgment and thought content.   Assessment and Plan:  Pregnancy: [id number] at [redacted]w[redacted]d  1. Supervision of other normal pregnancy, antepartum - 9lb weight gain since previous visit. Rx Reglan to replace Phenergan to facilitate more routine use of prescribed medications - Reviewed milestones for remaining visits, common complaints in third trimester - Discussed with patient that she does not have medical indication at this time for being removed from work. - Offered letter to request improved  accommodations including chair, rest breaks, access to water. Patient declined  2. Round ligament pain - Intermittent, mild at this time  3. Back pain affecting pregnancy in second trimester - Suggested walk and gentle stretch breaks during work day. Rx Flexeril to pharmacy - Consider maternity belt if no improvement  4. Anxiety and depression - Patient has appointment with psychologist this Friday 05/03/2018 - Plan for 2 week pp mood check  Preterm labor symptoms and general obstetric precautions including but not limited to vaginal bleeding, contractions, leaking of fluid and fetal movement were reviewed in detail with the patient. Please refer to After Visit Summary for other counseling recommendations.  Return in about 3 weeks (around 05/22/2018) for 28 week labs.  Future Appointments  Date Time Provider Department Center  05/23/2018  8:15 AM [place] Bing, MD [facility]    Alexa White, [place]

## 2018-05-23 ENCOUNTER — Ambulatory Visit (INDEPENDENT_AMBULATORY_CARE_PROVIDER_SITE_OTHER): Payer: Medicaid Other | Admitting: Obstetrics and Gynecology

## 2018-05-23 ENCOUNTER — Encounter: Payer: Self-pay | Admitting: *Deleted

## 2018-05-23 ENCOUNTER — Other Ambulatory Visit: Payer: Self-pay

## 2018-05-23 ENCOUNTER — Other Ambulatory Visit: Payer: Self-pay | Admitting: *Deleted

## 2018-05-23 VITALS — BP 113/78 | HR 79 | Wt 128.0 lb

## 2018-05-23 DIAGNOSIS — Z3480 Encounter for supervision of other normal pregnancy, unspecified trimester: Secondary | ICD-10-CM

## 2018-05-23 DIAGNOSIS — Z23 Encounter for immunization: Secondary | ICD-10-CM

## 2018-05-23 DIAGNOSIS — Z3402 Encounter for supervision of normal first pregnancy, second trimester: Secondary | ICD-10-CM

## 2018-05-23 DIAGNOSIS — Z3A28 28 weeks gestation of pregnancy: Secondary | ICD-10-CM

## 2018-05-23 DIAGNOSIS — O99342 Other mental disorders complicating pregnancy, second trimester: Secondary | ICD-10-CM

## 2018-05-23 DIAGNOSIS — F32A Depression, unspecified: Secondary | ICD-10-CM | POA: Insufficient documentation

## 2018-05-23 DIAGNOSIS — F329 Major depressive disorder, single episode, unspecified: Secondary | ICD-10-CM

## 2018-05-23 DIAGNOSIS — F419 Anxiety disorder, unspecified: Secondary | ICD-10-CM

## 2018-05-23 MED ORDER — MICONAZOLE NITRATE 2 % VA CREA: 1.0000 | TOPICAL_CREAM | Freq: Every day | VAGINAL | 1 refills | Status: DC

## 2018-05-23 MED ORDER — TERCONAZOLE 0.8 % VA CREA: 1.0000 | TOPICAL_CREAM | Freq: Every day | VAGINAL | 0 refills | Status: DC

## 2018-05-23 MED ORDER — ONDANSETRON 4 MG PO TBDP: 4.0000 mg | ORAL_TABLET | Freq: Three times a day (TID) | ORAL | 0 refills | Status: DC | PRN

## 2018-05-23 NOTE — Progress Notes (Signed)
Would like more zofran tablets and thinks she ,may have a yeast infection

## 2018-05-23 NOTE — Progress Notes (Signed)
Prenatal Visit Note Date: 05/23/2018 Clinic: [facility]  Subjective:  Alexa White is a 33 y.o. [phone] at [redacted]w[redacted]d being seen today for ongoing prenatal care.  She is currently monitored for the following issues for this low-risk pregnancy and has Supervision of other normal pregnancy, antepartum on their problem list.  Patient reports: white d/c, no irritation or itching or smell. Thinks maybe yeast infection  Contractions: Not present. Vag. Bleeding: None.  Movement: Present. Denies leaking of fluid.   The following portions of the patient's history were reviewed and updated as appropriate: allergies, current medications, past family history, past medical history, past social history, past surgical history and problem list. Problem list updated.  Objective:   Vitals:   05/23/18 0825  BP: 113/78  Pulse: 79  Weight: 128 lb (58.1 kg)    Fetal Status: Fetal Heart Rate (bpm): 142 Fundal Height: 28 cm Movement: Present     General:  Alert, oriented and cooperative. Patient is in no acute distress.  Skin: Skin is warm and dry. No rash noted.   Cardiovascular: Normal heart rate noted  Respiratory: Normal respiratory effort, no problems with respiration noted  Abdomen: Soft, gravid, appropriate for gestational age. Pain/Pressure: Present     Pelvic:  Cervical exam deferred        Extremities: Normal range of motion.  Edema: None  Mental Status: Normal mood and affect. Normal behavior. Normal judgment and thought content.   Urinalysis:      Assessment and Plan:  Pregnancy: [id number] at [redacted]w[redacted]d  1. Encounter for supervision of normal first pregnancy in second trimester Routine care. D/w her re: r/b/a and would like btl. Paper signed today. D/w her d/c sounds normal with pregnancy. Monistat 7 sent in in case s/s worsen. Needs zofran prn in the morning. Has bm qday. Refill sent in - Enroll patient in Babyscripts Program - Babyscripts Schedule Optimization - Glucose  Tolerance, 2 Hours w/1 Hour - RPR - CBC - HIV Antibody (routine testing w rflx)  2. Anxiety & Depression Doing well on fluoxetine and vistaril; sees therapist  Preterm labor symptoms and general obstetric precautions including but not limited to vaginal bleeding, contractions, leaking of fluid and fetal movement were reviewed in detail with the patient. Please refer to After Visit Summary for other counseling recommendations.  Return in about 3 weeks (around 06/13/2018).   Lacona Bing, MD

## 2018-05-24 LAB — CBC
Hematocrit: 28.4 % — ABNORMAL LOW (ref 34.0–46.6)
Hemoglobin: 10.1 g/dL — ABNORMAL LOW (ref 11.1–15.9)
MCH: 34.5 pg — ABNORMAL HIGH (ref 26.6–33.0)
MCHC: 35.6 g/dL (ref 31.5–35.7)
MCV: 97 fL (ref 79–97)
Platelets: 276 x10E3/uL (ref 150–450)
RBC: 2.93 x10E6/uL — ABNORMAL LOW (ref 3.77–5.28)
RDW: 11.6 % — ABNORMAL LOW (ref 11.7–15.4)
WBC: 10.3 x10E3/uL (ref 3.4–10.8)

## 2018-05-24 LAB — GLUCOSE TOLERANCE, 2 HOURS W/ 1HR
Glucose, 1 hour: 196 mg/dL — ABNORMAL HIGH (ref 65–179)
Glucose, 2 hour: 142 mg/dL (ref 65–152)
Glucose, Fasting: 83 mg/dL (ref 65–91)

## 2018-05-24 LAB — HIV ANTIBODY (ROUTINE TESTING W REFLEX): HIV Screen 4th Generation wRfx: NONREACTIVE

## 2018-05-24 LAB — SYPHILIS: RPR W/REFLEX TO RPR TITER AND TREPONEMAL ANTIBODIES, TRADITIONAL SCREENING AND DIAGNOSIS ALGORITHM: RPR Ser Ql: NONREACTIVE

## 2018-05-28 ENCOUNTER — Encounter: Payer: Self-pay | Admitting: Obstetrics and Gynecology

## 2018-05-28 ENCOUNTER — Other Ambulatory Visit: Payer: Self-pay

## 2018-05-28 DIAGNOSIS — O24410 Gestational diabetes mellitus in pregnancy, diet controlled: Secondary | ICD-10-CM

## 2018-05-28 DIAGNOSIS — O24419 Gestational diabetes mellitus in pregnancy, unspecified control: Secondary | ICD-10-CM | POA: Insufficient documentation

## 2018-05-28 NOTE — Telephone Encounter (Signed)
Call patient to inform of positive test for GDM. There was no answer I have left a message for patient to call me back regarding her test results.

## 2018-05-29 ENCOUNTER — Telehealth: Payer: Self-pay

## 2018-05-29 MED ORDER — ACCU-CHEK FASTCLIX LANCETS MISC: 12 refills | Status: DC

## 2018-05-29 MED ORDER — GLUCOSE BLOOD VI STRP: ORAL_STRIP | 12 refills | Status: DC

## 2018-05-29 MED ORDER — ACCU-CHEK AVIVA PLUS W/DEVICE KIT: 1.0000 | PACK | Freq: Once | 0 refills | Status: AC

## 2018-05-29 NOTE — Telephone Encounter (Signed)
Patient has been notified of lab results and the need to see Diabetes Education asap. I have advised her they will be calling her to set up her an appointment.

## 2018-05-29 NOTE — Telephone Encounter (Signed)
-----   Message from Rozann Lesches, NT sent at 05/29/2018  9:31 AM EDT ----- Regarding: lab results Patient called for lab results, states that if you call before 10 she will still be on break otherwise leave results on vm

## 2018-06-05 ENCOUNTER — Encounter: Payer: Medicaid Other | Attending: Obstetrics and Gynecology | Admitting: *Deleted

## 2018-06-05 ENCOUNTER — Encounter: Payer: Self-pay | Admitting: *Deleted

## 2018-06-05 ENCOUNTER — Other Ambulatory Visit: Payer: Self-pay

## 2018-06-05 VITALS — BP 110/60 | Ht 64.0 in | Wt 130.5 lb

## 2018-06-05 DIAGNOSIS — Z3A00 Weeks of gestation of pregnancy not specified: Secondary | ICD-10-CM | POA: Diagnosis not present

## 2018-06-05 DIAGNOSIS — O24410 Gestational diabetes mellitus in pregnancy, diet controlled: Secondary | ICD-10-CM | POA: Diagnosis not present

## 2018-06-05 MED ORDER — GLUCOSE BLOOD VI STRP: ORAL_STRIP | 12 refills | Status: DC

## 2018-06-05 NOTE — Patient Instructions (Signed)
Read booklet on Gestational Diabetes Follow Gestational Meal Planning Guidelines Avoid sugar sweetened drinks Don't skip meals Complete a 3 Day Food Record and bring to next appointment Check blood sugars 4 x day - before breakfast and 2 hrs after every meal and record  Bring blood sugar log to all appointments Call MD for prescription for meter strips and lancets Strips  Accu-Chek Guide Lancets  Accu-Chek FastClix Purchase urine ketone strips if blood sugars not controlled and check urine ketones every am:  If + increase bedtime snack to 1 protein and 2 carbohydrate servings Walk 20-30 minutes at least 5 x week if permitted by MD

## 2018-06-05 NOTE — Progress Notes (Signed)
Diabetes Self-Management Education  Visit Type: First/Initial  Appt. Start Time: 1025 Appt. End Time: 1205  06/05/2018  Ms. Alexa White, identified by name and date of birth, is a 33 y.o. female with a diagnosis of Diabetes: Gestational Diabetes.   ASSESSMENT  Blood pressure 110/60, height 5\' 4"  (1.626 m), weight 130 lb 8 oz (59.2 kg), last menstrual period 10/25/2017, currently breastfeeding. Body mass index is 22.4 kg/m.  Diabetes Self-Management Education - 06/05/18 1237      Visit Information   Visit Type  First/Initial      Initial Visit   Diabetes Type  Gestational Diabetes    Are you currently following a meal plan?  No    Are you taking your medications as prescribed?  Yes    Date Diagnosed  1 week ago      Health Coping   How would you rate your overall health?  Fair      Psychosocial Assessment   Patient Belief/Attitude about Diabetes  Other (comment)   "nervous"   Self-care barriers  None    Self-management support  Doctor's office;Family    Patient Concerns  Nutrition/Meal planning;Glycemic Control;Monitoring;Healthy Lifestyle    Special Needs  None    Preferred Learning Style  Auditory;Hands on    Learning Readiness  Ready    How often do you need to have someone help you when you read instructions, pamphlets, or other written materials from your doctor or pharmacy?  1 - Never    What is the last grade level you completed in school?  12th      Pre-Education Assessment   Patient understands the diabetes disease and treatment process.  Needs Instruction    Patient understands incorporating nutritional management into lifestyle.  Needs Instruction    Patient undertands incorporating physical activity into lifestyle.  Needs Instruction    Patient understands using medications safely.  Needs Instruction    Patient understands monitoring blood glucose, interpreting and using results  Needs Instruction    Patient understands prevention, detection, and  treatment of acute complications.  Needs Instruction    Patient understands prevention, detection, and treatment of chronic complications.  Needs Instruction    Patient understands how to develop strategies to address psychosocial issues.  Needs Instruction      Complications   How often do you check your blood sugar?  0 times/day (not testing)   Provided Accu-Chek Guide Me meter and instructed on use. BG upon return demonstration was 90 mg/dL at 16:1011:55 am - 3 hrs pp.    Have you had a dilated eye exam in the past 12 months?  No    Have you had a dental exam in the past 12 months?  Yes    Are you checking your feet?  Yes    How many days per week are you checking your feet?  7      Dietary Intake   Breakfast  wakes up at 3 am - eats plan yogurt, cheese, fruit (oranges, mango, pears, peaches) for 7 am at work    Lunch  9:30 am - eats at her lunch break - tv dinner, pizza, spaghetti    Snack (afternoon)  3-4 snacks per day of peanut butter crackers, chips, cookies, cheese    Dinner  beef, chicken, fried fish, bread, rice, noodles, green beans, corn, peas, non-starchy vegetables    Beverage(s)  water, sips of sugar sweetened drinks and coffee      Exercise   Exercise Type  Light (walking / raking leaves)    How many days per week to you exercise?  5    How many minutes per day do you exercise?  30    Total minutes per week of exercise  150      Patient Education   Previous Diabetes Education  No    Disease state   Definition of diabetes, type 1 and 2, and the diagnosis of diabetes;Factors that contribute to the development of diabetes    Nutrition management   Role of diet in the treatment of diabetes and the relationship between the three main macronutrients and blood glucose level;Reviewed blood glucose goals for pre and post meals and how to evaluate the patients' food intake on their blood glucose level.    Physical activity and exercise   Role of exercise on diabetes management, blood  pressure control and cardiac health.    Monitoring  Taught/evaluated SMBG meter.;Purpose and frequency of SMBG.;Taught/discussed recording of test results and interpretation of SMBG.;Ketone testing, when, how.    Chronic complications  Relationship between chronic complications and blood glucose control    Psychosocial adjustment  Identified and addressed patients feelings and concerns about diabetes    Preconception care  Pregnancy and GDM  Role of pre-pregnancy blood glucose control on the development of the fetus;Role of family planning for patients with diabetes;Reviewed with patient blood glucose goals with pregnancy      Individualized Goals (developed by patient)   Reducing Risk  Improve blood sugars Prevent diabetes complications Lead a healthier lifestyle Become more fit     Outcomes   Expected Outcomes  Demonstrated interest in learning. Expect positive outcomes       Individualized Plan for Diabetes Self-Management Training:   Learning Objective:  Patient will have a greater understanding of diabetes self-management. Patient education plan is to attend individual and/or group sessions per assessed needs and concerns.   Plan:   Patient Instructions  Read booklet on Gestational Diabetes Follow Gestational Meal Planning Guidelines Avoid sugar sweetened drinks Don't skip meals Complete a 3 Day Food Record and bring to next appointment Check blood sugars 4 x day - before breakfast and 2 hrs after every meal and record  Bring blood sugar log to all appointments Call MD for prescription for meter strips and lancets Strips  Accu-Chek Guide Lancets  Accu-Chek FastClix Purchase urine ketone strips if blood sugars not controlled and check urine ketones every am:  If + increase bedtime snack to 1 protein and 2 carbohydrate servings Walk 20-30 minutes at least 5 x week if permitted by MD  Expected Outcomes:  Demonstrated interest in learning. Expect positive outcomes  Education  material provided:  Gestational Booklet Gestational Meal Planning Guidelines Simple Meal Plan Viewed Gestational Diabetes Video Meter = Accu-Chek Guide Me 3 Day Food Record Goals for a Healthy Pregnancy  If problems or questions, patient to contact team via:  Sharion Settler, RN, CCM, CDE [phone]  Future DSME appointment:  June 13, 2018 with the dietitian

## 2018-06-05 NOTE — Telephone Encounter (Signed)
Patient was sent in the wrong strips for her meter.Will send in the correct one.

## 2018-06-13 ENCOUNTER — Encounter: Payer: Medicaid Other | Admitting: Obstetrics and Gynecology

## 2018-06-13 ENCOUNTER — Telehealth: Payer: Self-pay | Admitting: *Deleted

## 2018-06-13 ENCOUNTER — Ambulatory Visit: Payer: Medicaid Other | Admitting: Dietician

## 2018-06-13 NOTE — Telephone Encounter (Signed)
Pt called stating she missed appointment due to being sick, has been throwing up and hasn't be able to hold much down, also feeling light headed and seeing spots, asked if she should go to hospital. Advised that since she isnt keeping much down and does feel the way she does that yes she should to to MAU and WCC.  Pt also stated she has had trouble with babyscripts app on her phone and asked if there was another way to enter her BP's. Pt has access to mychart, so I told her she could enter her BP and blood sugars into my chart and we could view them that way.   Scheryl Marten, RN

## 2018-06-19 ENCOUNTER — Encounter: Payer: Medicaid Other | Admitting: Advanced Practice Midwife

## 2018-06-24 ENCOUNTER — Other Ambulatory Visit: Payer: Self-pay

## 2018-06-24 ENCOUNTER — Ambulatory Visit (INDEPENDENT_AMBULATORY_CARE_PROVIDER_SITE_OTHER): Payer: Medicaid Other | Admitting: Obstetrics & Gynecology

## 2018-06-24 VITALS — BP 114/74 | HR 72 | Wt 130.6 lb

## 2018-06-24 DIAGNOSIS — O24410 Gestational diabetes mellitus in pregnancy, diet controlled: Secondary | ICD-10-CM

## 2018-06-24 DIAGNOSIS — O0993 Supervision of high risk pregnancy, unspecified, third trimester: Secondary | ICD-10-CM

## 2018-06-24 DIAGNOSIS — Z3A33 33 weeks gestation of pregnancy: Secondary | ICD-10-CM

## 2018-06-24 NOTE — Patient Instructions (Signed)
Return to office for any scheduled appointments. Call the office or go to the MAU at [facility] if:  You begin to have strong, frequent contractions  Your water breaks.  Sometimes it is a big gush of fluid, sometimes it is just a trickle that keeps getting your panties wet or running down your legs  You have vaginal bleeding.  It is normal to have a small amount of spotting if your cervix was checked.   You do not feel your baby moving like normal.  If you do not, get something to eat and drink and lay down and focus on feeling your baby move.   If your baby is still not moving like normal, you should call the office or go to MAU.  Any other obstetric concerns.   

## 2018-06-24 NOTE — Progress Notes (Signed)
   PRENATAL VISIT NOTE  Subjective:  Alexa White is a 33 y.o. [phone] at [redacted]w[redacted]d being seen today for ongoing prenatal care.  She is currently monitored for the following issues for this high-risk pregnancy and has Supervision of high-risk pregnancy, third trimester; Anxiety and depression; and GDM (gestational diabetes mellitus) on their problem list.  Patient reports no complaints. Missed a few appointment due to work and other issues, unable to connect with phone.  Contractions: Not present. Vag. Bleeding: None.  Movement: Present. Denies leaking of fluid.   The following portions of the patient's history were reviewed and updated as appropriate: allergies, current medications, past family history, past medical history, past social history, past surgical history and problem list.   Objective:   Vitals:   06/24/18 1320  BP: 114/74  Pulse: 72  Weight: 130 lb 9.6 oz (59.2 kg)    Fetal Status: Fetal Heart Rate (bpm): 138 Fundal Height: 33 cm Movement: Present     General:  Alert, oriented and cooperative. Patient is in no acute distress.  Skin: Skin is warm and dry. No rash noted.   Cardiovascular: Normal heart rate noted  Respiratory: Normal respiratory effort, no problems with respiration noted  Abdomen: Soft, gravid, appropriate for gestational age.  Pain/Pressure: Absent     Pelvic: Cervical exam deferred        Extremities: Normal range of motion.  Edema: None  Mental Status: Normal mood and affect. Normal behavior. Normal judgment and thought content.   Assessment and Plan:  Pregnancy: [id number] at [redacted]w[redacted]d 1. Diet controlled gestational diabetes mellitus (GDM) in third trimester Unable to make Babyscripts work on her phone as she is having phone issues. For now, will see her face-to-face.  She will try to take pictures of her BS and send via MyChart, has had issues with accessing this in the past.  Reports that her sugars have been mostly in range. Growth scan scheduled around 38  weeks. - [place] MFM OB FOLLOW UP; Future  2. Supervision of high-risk pregnancy, third trimester Preterm labor symptoms and general obstetric precautions including but not limited to vaginal bleeding, contractions, leaking of fluid and fetal movement were reviewed in detail with the patient. Please refer to After Visit Summary for other counseling recommendations.   Return in about 2 weeks (around 07/08/2018) for OFFICE OB Visit.  Future Appointments  Date Time Provider Department Center  06/24/2018  4:15 PM January Bergthold, Jethro Bastos, MD [facility]  07/10/2018  2:30 PM Federico Flake, MD [facility]    Jaynie Collins, MD

## 2018-06-28 ENCOUNTER — Inpatient Hospital Stay (HOSPITAL_COMMUNITY)
Admission: AD | Admit: 2018-06-28 | Discharge: 2018-06-29 | Disposition: A | Discharge status: Home / Self Care | Payer: Medicaid Other | Attending: Obstetrics and Gynecology | Admitting: Obstetrics and Gynecology

## 2018-06-28 ENCOUNTER — Other Ambulatory Visit: Payer: Self-pay

## 2018-06-28 ENCOUNTER — Telehealth: Payer: Self-pay | Admitting: Dietician

## 2018-06-28 ENCOUNTER — Encounter (HOSPITAL_COMMUNITY): Payer: Self-pay

## 2018-06-28 DIAGNOSIS — O09893 Supervision of other high risk pregnancies, third trimester: Secondary | ICD-10-CM | POA: Diagnosis not present

## 2018-06-28 DIAGNOSIS — O212 Late vomiting of pregnancy: Secondary | ICD-10-CM | POA: Diagnosis present

## 2018-06-28 DIAGNOSIS — Z87891 Personal history of nicotine dependence: Secondary | ICD-10-CM | POA: Insufficient documentation

## 2018-06-28 DIAGNOSIS — O09899 Supervision of other high risk pregnancies, unspecified trimester: Secondary | ICD-10-CM

## 2018-06-28 DIAGNOSIS — Z3A33 33 weeks gestation of pregnancy: Secondary | ICD-10-CM | POA: Insufficient documentation

## 2018-06-28 DIAGNOSIS — Z79899 Other long term (current) drug therapy: Secondary | ICD-10-CM | POA: Insufficient documentation

## 2018-06-28 DIAGNOSIS — O99323 Drug use complicating pregnancy, third trimester: Secondary | ICD-10-CM | POA: Diagnosis not present

## 2018-06-28 DIAGNOSIS — Z3689 Encounter for other specified antenatal screening: Secondary | ICD-10-CM

## 2018-06-28 DIAGNOSIS — R8789 Other abnormal findings in specimens from female genital organs: Secondary | ICD-10-CM

## 2018-06-28 DIAGNOSIS — R112 Nausea with vomiting, unspecified: Secondary | ICD-10-CM

## 2018-06-28 DIAGNOSIS — F1290 Cannabis use, unspecified, uncomplicated: Secondary | ICD-10-CM | POA: Insufficient documentation

## 2018-06-28 LAB — COMPREHENSIVE METABOLIC PANEL
ALT: 15 U/L (ref 0–44)
AST: 15 U/L (ref 15–41)
Albumin: 2.7 g/dL — ABNORMAL LOW (ref 3.5–5.0)
Alkaline Phosphatase: 147 U/L — ABNORMAL HIGH (ref 38–126)
Anion gap: 12 (ref 5–15)
BUN: 8 mg/dL (ref 6–20)
CO2: 21 mmol/L — ABNORMAL LOW (ref 22–32)
Calcium: 8.6 mg/dL — ABNORMAL LOW (ref 8.9–10.3)
Chloride: 104 mmol/L (ref 98–111)
Creatinine, Ser: 0.46 mg/dL (ref 0.44–1.00)
GFR calc Af Amer: >60 mL/min (ref >60)
GFR calc non Af Amer: >60 mL/min (ref >60)
Glucose, Bld: 74 mg/dL (ref 70–99)
Potassium: 3.4 mmol/L — ABNORMAL LOW (ref 3.5–5.1)
Sodium: 137 mmol/L (ref 135–145)
Total Bilirubin: 0.5 mg/dL (ref 0.3–1.2)
Total Protein: 6 g/dL — ABNORMAL LOW (ref 6.5–8.1)

## 2018-06-28 LAB — CBC WITH DIFFERENTIAL/PLATELET
Abs Immature Granulocytes: 0.23 10*3/uL — ABNORMAL HIGH (ref 0.00–0.07)
Basophils Absolute: 0 10*3/uL (ref 0.0–0.1)
Basophils Relative: 0 %
Eosinophils Absolute: 0.1 10*3/uL (ref 0.0–0.5)
Eosinophils Relative: 1 %
HCT: 30.4 % — ABNORMAL LOW (ref 36.0–46.0)
Hemoglobin: 10.3 g/dL — ABNORMAL LOW (ref 12.0–15.0)
Immature Granulocytes: 2 %
Lymphocytes Relative: 23 %
Lymphs Abs: 2.5 10*3/uL (ref 0.7–4.0)
MCH: 33.4 pg (ref 26.0–34.0)
MCHC: 33.9 g/dL (ref 30.0–36.0)
MCV: 98.7 fL (ref 80.0–100.0)
Monocytes Absolute: 1 10*3/uL (ref 0.1–1.0)
Monocytes Relative: 9 %
Neutro Abs: 7.2 10*3/uL (ref 1.7–7.7)
Neutrophils Relative %: 65 %
Platelets: 320 10*3/uL (ref 150–400)
RBC: 3.08 MIL/uL — ABNORMAL LOW (ref 3.87–5.11)
RDW: 12.2 % (ref 11.5–15.5)
WBC: 11 10*3/uL — ABNORMAL HIGH (ref 4.0–10.5)
nRBC: 0 % (ref 0.0–0.2)

## 2018-06-28 LAB — URINALYSIS, ROUTINE W REFLEX MICROSCOPIC
Bilirubin Urine: NEGATIVE
Glucose, UA: NEGATIVE mg/dL
Hgb urine dipstick: NEGATIVE
Ketones, ur: 5 mg/dL — AB
Leukocytes,Ua: NEGATIVE
Nitrite: NEGATIVE
Protein, ur: NEGATIVE mg/dL
Specific Gravity, Urine: 1.013 (ref 1.005–1.030)
pH: 7 (ref 5.0–8.0)

## 2018-06-28 LAB — RAPID URINE DRUG SCREEN, HOSP PERFORMED
Amphetamines: NOT DETECTED
Barbiturates: NOT DETECTED
Benzodiazepines: NOT DETECTED
Cocaine: NOT DETECTED
Opiates: NOT DETECTED
Tetrahydrocannabinol: POSITIVE — AB

## 2018-06-28 MED ORDER — FAMOTIDINE IN NACL 20-0.9 MG/50ML-% IV SOLN
20.0000 mg | Freq: Once | INTRAVENOUS | Status: AC
  Administered 2018-06-28: 20 mg via INTRAVENOUS
  Filled 2018-06-28: qty 50

## 2018-06-28 MED ORDER — LACTATED RINGERS IV BOLUS
1000.0000 mL | Freq: Once | INTRAVENOUS | Status: AC
  Administered 2018-06-28: 1000 mL via INTRAVENOUS

## 2018-06-28 MED ORDER — ONDANSETRON HCL 4 MG/2ML IJ SOLN
4.0000 mg | Freq: Once | INTRAMUSCULAR | Status: AC
  Administered 2018-06-28: 23:00:00 4 mg via INTRAVENOUS
  Filled 2018-06-28: qty 2

## 2018-06-28 NOTE — Telephone Encounter (Signed)
Called patient to reschedule her missed appointment from 06/13/18. Rescheduled for 07/05/18. She reports have frequent nausea with pregnancy; states BGs are within goal range. Advised patient to call if she is unable to come for the next appointment and if she feels she does not need to return.

## 2018-06-28 NOTE — MAU Note (Signed)
Pt reports vomiting during the pregnancy but worse for the past 2 days. Reports she has not been able to keep anything down. Reports she fell the other day at work, but did not hit belly but thinks she "went unconscious." Has GDM but not on meds. Pt denies vaginal bleeding or LOF. Reports good fetal movement. Has a sharp pain in mid abdomen.

## 2018-06-28 NOTE — MAU Provider Note (Addendum)
History     CSN: [id number]  Arrival date and time: 06/28/18 2032   First Provider Initiated Contact with Patient 06/28/18 2139      Chief Complaint  Patient presents with  . Emesis   Ms. Alexa White is a 33 y.o. [id number] at [redacted]w[redacted]d who presents to MAU for N/V/diarrhea. Pt reports nausea and vomiting has been a problem for her since the first trimester. Pt reports N/V is better some days and worse others. Pt reports she came in today because she was awoken from sleep by vomiting today. Pt denies smoking any marijuana since October 2019. Pt denies eating new/unusual/uncooked foods in the past week. Pt denies anything in the vagina in the last 24hours.  Onset: since first trimester, worse in the last two days Location: abdomen Duration: on-going since first trimester Character: nausea, vomiting Aggravating/Associated: none/burning sensation in chest, dizziness + ears ringing with every episode of nausea and vomiting, pt also reports loose stool once per week for 2 months, but today had single episode of watery diarrhea without blood or mucus Relieving: sleep (sometimes) Treatment: Zofran (works sometimes, does not take consistently, only takes once in AM prior to work)  Pt denies VB, LOF, ctx, decreased FM, vaginal discharge/odor/itching. Pt denies abdominal pain, constipation, or urinary problems. Pt denies fever, chills, fatigue, sweating or changes in appetite. Pt denies SOB or chest pain. Pt denies HA, weakness.  Problems this pregnancy include: GDM. Allergies? NKDA Current medications/supplements? Fluoxetine, AMOX for post-tooth removal surgery Prenatal care provider? [facility], next appt 07/08/2018  Pt also reports a fall at work on 06/26/2018 - as noted above, no VB/ctx/abdominal pain. Pt reports she did not hit her stomach and denies any decrease in FM. Pt denies hitting her head. Reports she fell on her knees and her hands.  Of note, pt is not vomiting during  discussion with provider and is requesting items to eat and drink prior to administration of medications.  Pt also reports weight loss, but has gained 18.27lbs since her initial OB visit, but has lost about 1kg since 06/24/2018.   OB History    Gravida  4   Para  2   Term  2   Preterm      AB  1   Living  2     SAB      TAB  1   Ectopic      Multiple  0   Live Births  2           Past Medical History:  Diagnosis Date  . Anemia   . Anxiety   . Depression    PTSD  . Former smoker    Quit- 06/01/14  . Gestational diabetes   . 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 nose repair     broken nose- had to be reset  . WISDOM TOOTH EXTRACTION  2011    Family History  Problem Relation Age of Onset  . Mental illness Mother        bipolar    Social History   Tobacco Use  . Smoking status: Former Smoker    Packs/day: 1.00    Years: 3.00    Pack years: 3.00    Types: Cigarettes    Last attempt to quit: 06/01/2014    Years since quitting: 4.0  . Smokeless tobacco: Never Used  Substance Use Topics  . Alcohol use: Not Currently  Comment: last used in October 2019  . Drug use: Yes    Types: Marijuana    Comment: last used in October 2019    Allergies: No Known Allergies  Medications Prior to Admission  Medication Sig Dispense Refill Last Dose  . Accu-Chek FastClix Lancets MISC Please check blood sugar 4 times a day for gest diabetes 100 each 12 06/27/2018 at Unknown time  . amoxicillin (AMOXIL) 500 MG tablet Take 500 mg by mouth 2 (two) times daily. Every 8 hours per pt   06/28/2018 at Unknown time  . FLUoxetine (PROZAC) 40 MG capsule Take 40 mg by mouth daily.   06/28/2018 at Unknown time  . glucose blood (ACCU-CHEK GUIDE) test strip Use to check blood sugars four times a day was instructed 50 each 12 06/27/2018 at Unknown time  . glucose blood test strip Please check blood sugars four times a  day 100 each 12 06/27/2018 at Unknown time  . hydrOXYzine (VISTARIL) 25 MG capsule Take 25 mg by mouth every 4 (four) hours as needed for anxiety.   Past Week at Unknown time  . Multiple Vitamin (MULTIVITAMIN WITH MINERALS) TABS tablet Take 1 tablet by mouth daily.   06/27/2018 at Unknown time  . ondansetron (ZOFRAN-ODT) 4 MG disintegrating tablet Take 1 tablet (4 mg total) by mouth every 8 (eight) hours as needed for nausea or vomiting. 45 tablet 0 06/27/2018 at Unknown time  . cyclobenzaprine (FLEXERIL) 10 MG tablet Take 1 tablet (10 mg total) by mouth every 8 (eight) hours as needed for muscle spasms. (Patient not taking: Reported on 06/05/2018) 30 tablet 1 Not Taking  . metoCLOPramide (REGLAN) 10 MG tablet Take 1 tablet (10 mg total) by mouth 4 (four) times daily as needed for nausea or vomiting. (Patient not taking: Reported on 05/23/2018) 30 tablet 2 Not Taking  . metroNIDAZOLE (FLAGYL) 500 MG tablet Take 1 tablet (500 mg total) by mouth 2 (two) times daily. (Patient not taking: Reported on 05/01/2018) 14 tablet 0 Not Taking  . miconazole (MONISTAT 7) 2 % vaginal cream Place 1 Applicatorful vaginally at bedtime. Apply for seven nights (Patient not taking: Reported on 06/05/2018) 30 g 1 Not Taking  . promethazine (PHENERGAN) 25 MG tablet Take 0.5-1 tablets (12.5-25 mg total) by mouth every 6 (six) hours as needed. (Patient not taking: Reported on 05/23/2018) 30 tablet 0 Not Taking  . scopolamine (TRANSDERM-SCOP) 1 MG/3DAYS Place 1 patch (1.5 mg total) onto the skin every 3 (three) days. (Patient not taking: Reported on 02/20/2018) 10 patch 1 Not Taking  . terconazole (TERAZOL 3) 0.8 % vaginal cream Place 1 applicator vaginally at bedtime. Apply nightly for three nights. 20 g 0 Taking    Review of Systems  Constitutional: Negative for chills, diaphoresis, fatigue and fever.  Respiratory: Negative for shortness of breath.   Cardiovascular: Negative for chest pain.  Gastrointestinal: Positive for diarrhea,  nausea and vomiting. Negative for abdominal pain and constipation.  Genitourinary: Negative for dysuria, flank pain, frequency, pelvic pain, urgency, vaginal bleeding and vaginal discharge.  Neurological: Positive for dizziness. Negative for weakness, light-headedness and headaches.   Physical Exam   Blood pressure 125/75, pulse 80, temperature 98.7 F (37.1 C), temperature source Oral, resp. rate 20, height  (1.626 m), weight 58.1 kg, last menstrual period 10/25/2017, SpO2 99 %, currently breastfeeding.  Patient Vitals for the past 24 hrs:  BP Temp Temp src Pulse Resp SpO2 Height Weight  06/28/18 2059 125/75 98.7 F (37.1 C) Oral 80 20 99 % - -  06/28/18 2047 - - - - - -  (1.626 m) 58.1 kg    Physical Exam  Constitutional: She is oriented to person, place, and time. She appears well-developed and well-nourished. No distress.  HENT:  Head: Normocephalic and atraumatic.  Respiratory: Effort normal.  GI: Soft. She exhibits no distension and no mass. There is no abdominal tenderness. There is no rebound and no guarding.  Neurological: She is alert and oriented to person, place, and time.  Skin: Skin is warm and dry. She is not diaphoretic.  Psychiatric: She has a normal mood and affect. Her behavior is normal. Judgment and thought content normal.   Results for orders placed or performed during the hospital encounter of 06/28/18 (from the past 24 hour(s))  CBC with Differential/Platelet     Status: Abnormal   Collection Time: 06/28/18  8:44 PM  Result Value Ref Range   WBC 11.0 (H) 4.0 - 10.5 K/uL   RBC 3.08 (L) 3.87 - 5.11 MIL/uL   Hemoglobin 10.3 (L) 12.0 - 15.0 g/dL   HCT 16.1 (L) 09.6 - 04.5 %   MCV 98.7 80.0 - 100.0 fL   MCH 33.4 26.0 - 34.0 pg   MCHC 33.9 30.0 - 36.0 g/dL   RDW 40.9 81.1 - 91.4 %   Platelets 320 150 - 400 K/uL   nRBC 0.0 0.0 - 0.2 %   Neutrophils Relative % 65 %   Neutro Abs 7.2 1.7 - 7.7 K/uL   Lymphocytes Relative 23 %   Lymphs Abs 2.5 0.7 -  4.0 K/uL   Monocytes Relative 9 %   Monocytes Absolute 1.0 0.1 - 1.0 K/uL   Eosinophils Relative 1 %   Eosinophils Absolute 0.1 0.0 - 0.5 K/uL   Basophils Relative 0 %   Basophils Absolute 0.0 0.0 - 0.1 K/uL   Immature Granulocytes 2 %   Abs Immature Granulocytes 0.23 (H) 0.00 - 0.07 K/uL  Comprehensive metabolic panel     Status: Abnormal   Collection Time: 06/28/18  8:44 PM  Result Value Ref Range   Sodium 137 135 - 145 mmol/L   Potassium 3.4 (L) 3.5 - 5.1 mmol/L   Chloride 104 98 - 111 mmol/L   CO2 21 (L) 22 - 32 mmol/L   Glucose, Bld 74 70 - 99 mg/dL   BUN 8 6 - 20 mg/dL   Creatinine, Ser 7.82 0.44 - 1.00 mg/dL   Calcium 8.6 (L) 8.9 - 10.3 mg/dL   Total Protein 6.0 (L) 6.5 - 8.1 g/dL   Albumin 2.7 (L) 3.5 - 5.0 g/dL   AST 15 15 - 41 U/L   ALT 15 0 - 44 U/L   Alkaline Phosphatase 147 (H) 38 - 126 U/L   Total Bilirubin 0.5 0.3 - 1.2 mg/dL   GFR calc non Af Amer >60 >60 mL/min   GFR calc Af Amer >60 >60 mL/min   Anion gap 12 5 - 15  Urinalysis, Routine w reflex microscopic     Status: Abnormal   Collection Time: 06/28/18  9:19 PM  Result Value Ref Range   Color, Urine YELLOW YELLOW   APPearance HAZY (A) CLEAR   Specific Gravity, Urine 1.013 1.005 - 1.030   pH 7.0 5.0 - 8.0   Glucose, UA NEGATIVE NEGATIVE mg/dL   Hgb urine dipstick NEGATIVE NEGATIVE   Bilirubin Urine NEGATIVE NEGATIVE   Ketones, ur 5 (A) NEGATIVE mg/dL   Protein, ur NEGATIVE NEGATIVE mg/dL   Nitrite NEGATIVE NEGATIVE   Leukocytes,Ua NEGATIVE  NEGATIVE  Urine rapid drug screen (hosp performed)     Status: Abnormal   Collection Time: 06/28/18  9:19 PM  Result Value Ref Range   Opiates NONE DETECTED NONE DETECTED   Cocaine NONE DETECTED NONE DETECTED   Benzodiazepines NONE DETECTED NONE DETECTED   Amphetamines NONE DETECTED NONE DETECTED   Tetrahydrocannabinol POSITIVE (A) NONE DETECTED   Barbiturates NONE DETECTED NONE DETECTED  Fetal fibronectin     Status: Abnormal   Collection Time: 06/29/18 12:33  AM  Result Value Ref Range   Fetal Fibronectin POSITIVE (A) NEGATIVE   No results found.  MAU Course  Procedures  MDM -on-going N/V in pregnancy with onset of watery diarrhea today -UDS: +THC (of note, after discussing this result with pt, she reports "off and on" use of marijuana during pregnancy) -UA: hazy/5 ketones, otherwise WNL -EFM: reactive       -baseline: 125       -variability: moderate       -accels: present, 15x15       -decels: absent       -TOCO: irritability and few irregular ctx not felt by patient -CBC w/ diff: WNL for pregnancy except for elevated immature granulocytes -CMP: no abnormal values requiring treatment -CE: almost 3cm/midline/soft -1L LR + Zofran 4mg  + Pepcid 20mg  given, pt reports N/V has resolved -PO challenge successful, pt has not vomited since being admitted to MAU -spoke with Dr. Emelda FearFerguson @[phone]  - pt does not need a neurology consult for dizziness/tinnitus - can be managed at clinic; pt also not a candidate for betamethasone because she will be 34wks tomorrow and will not be able to get all her doses prior to 34wks, so this patient is not a candidate for betamethasone, regardless of fFN result -fFN result: POSITIVE -pt discharged to home in stable condition  Orders Placed This Encounter  Procedures  . Urinalysis, Routine w reflex microscopic    Standing Status:   Standing    Number of Occurrences:   1  . Urine rapid drug screen (hosp performed)    Standing Status:   Standing    Number of Occurrences:   1  . CBC with Differential/Platelet    Standing Status:   Standing    Number of Occurrences:   1  . Comprehensive metabolic panel    Standing Status:   Standing    Number of Occurrences:   1  . Fetal fibronectin    Standing Status:   Standing    Number of Occurrences:   1  . Insert peripheral IV    Standing Status:   Standing    Number of Occurrences:   1  . Discharge patient    Order Specific Question:   Discharge disposition     Answer:   01-Home or Self Care [1]    Order Specific Question:   Discharge patient date    Answer:   06/29/2018     Assessment and Plan   1. Non-intractable vomiting with nausea, unspecified vomiting type   2. Marijuana use   3. [redacted] weeks gestation of pregnancy   4. NST (non-stress test) reactive   5. Positive fetal fibronectin at 22 weeks to [redacted] weeks gestation    Allergies as of 06/29/2018   No Known Allergies     Medication List    STOP taking these medications   metoCLOPramide 10 MG tablet Commonly known as:  REGLAN   promethazine 25 MG tablet Commonly known as:  PHENERGAN  TAKE these medications   Accu-Chek FastClix Lancets Misc Please check blood sugar 4 times a day for gest diabetes   amoxicillin 500 MG tablet Commonly known as:  AMOXIL Take 500 mg by mouth 2 (two) times daily. Every 8 hours per pt   cyclobenzaprine 10 MG tablet Commonly known as:  FLEXERIL Take 1 tablet (10 mg total) by mouth every 8 (eight) hours as needed for muscle spasms.   famotidine 20 MG tablet Commonly known as:  PEPCID Take 1 tablet (20 mg total) by mouth 2 (two) times daily.   FLUoxetine 40 MG capsule Commonly known as:  PROZAC Take 40 mg by mouth daily.   glucose blood test strip Please check blood sugars four times a day   glucose blood test strip Commonly known as:  Accu-Chek Guide Use to check blood sugars four times a day was instructed   hydrOXYzine 25 MG capsule Commonly known as:  VISTARIL Take 25 mg by mouth every 4 (four) hours as needed for anxiety.   metroNIDAZOLE 500 MG tablet Commonly known as:  FLAGYL Take 1 tablet (500 mg total) by mouth 2 (two) times daily.   miconazole 2 % vaginal cream Commonly known as:  MONISTAT 7 Place 1 Applicatorful vaginally at bedtime. Apply for seven nights   multivitamin with minerals Tabs tablet Take 1 tablet by mouth daily.   ondansetron 4 MG disintegrating tablet Commonly known as:  ZOFRAN-ODT Take 1 tablet (4 mg  total) by mouth every 8 (eight) hours as needed for nausea or vomiting.   scopolamine 1 MG/3DAYS Commonly known as:  Transderm-Scop (1.5 MG) Place 1 patch (1.5 mg total) onto the skin every 3 (three) days for 8 doses.   terconazole 0.8 % vaginal cream Commonly known as:  TERAZOL 3 Place 1 applicator vaginally at bedtime. Apply nightly for three nights.       -discussed taking pharmacologic medications as prescribed and not skipping doses - pt reports she has Zofran at home, RX sent for scopolamine patch and pepcid -discussed avoidance of marijuana use in pregnancy and opting for prescribed medications to treat N/V as marijuana use is not considered healthy in pregnancy and can cause issues with cyclic nausea and vomiting -discussed non-pharmacologic treatment of N/V in pregnancy -discussed expectations of N/V in pregnancy -discussed link between ABX use and diarrhea, nausea, vomiting -strict PTL precautions/return MAU precautions given -message sent to [facility] office regarding MAU findigns today to be passed along to in-office provider on Monday 07/01/2018 -pt discharged to home in stable condition  Joni Reining E Calynn Ferrero 06/29/2018, 2:05 AM

## 2018-06-29 DIAGNOSIS — Z3A33 33 weeks gestation of pregnancy: Secondary | ICD-10-CM

## 2018-06-29 DIAGNOSIS — O09893 Supervision of other high risk pregnancies, third trimester: Secondary | ICD-10-CM

## 2018-06-29 DIAGNOSIS — R8789 Other abnormal findings in specimens from female genital organs: Secondary | ICD-10-CM

## 2018-06-29 DIAGNOSIS — R112 Nausea with vomiting, unspecified: Secondary | ICD-10-CM

## 2018-06-29 DIAGNOSIS — Z3689 Encounter for other specified antenatal screening: Secondary | ICD-10-CM

## 2018-06-29 LAB — FETAL FIBRONECTIN: Fetal Fibronectin: POSITIVE — AB

## 2018-06-29 MED ORDER — FAMOTIDINE 20 MG PO TABS: 20.0000 mg | ORAL_TABLET | Freq: Two times a day (BID) | ORAL | 1 refills | Status: DC

## 2018-06-29 MED ORDER — SCOPOLAMINE 1 MG/3DAYS TD PT72: 1.0000 | MEDICATED_PATCH | TRANSDERMAL | 1 refills | Status: AC

## 2018-06-29 NOTE — Discharge Instructions (Signed)
Cannabinoid Hyperemesis Syndrome °Cannabinoid hyperemesis syndrome (CHS) is a condition that causes repeated nausea, vomiting, and abdominal pain after long-term (chronic) use of marijuana (cannabis). People with CHS typically use marijuana 3-5 times a day for many years before they have symptoms, although it is possible to develop CHS with as little as 1 use per day. °Symptoms of CHS may be mild at first but can get worse and more frequent. In some cases, CHS may cause vomiting many times a day, which can lead to weight loss and dehydration. CHS may go away and come back many times (recur). People may not have symptoms or may otherwise be healthy in between CHS attacks. °What are the causes? °The exact cause of this condition is not known. Long-term use of marijuana may over-stimulate certain proteins in the brain that react with chemicals in marijuana (cannabinoid receptors). This over-stimulation may cause CHS. °What are the signs or symptoms? °Symptoms of this condition are often mild during the first few attacks, but they can get worse over time. Symptoms may include: °· Frequent nausea, especially early in the morning. °· Vomiting. °· Abdominal pain. °Taking several hot showers throughout the day can also be a sign of this condition. People with CHS may do this because it relieves symptoms. °How is this diagnosed? °This condition may be diagnosed based on: °· Your symptoms and medical history, including any drug use. °· A physical exam. °You may have tests done to rule out other problems. These tests may include: °· Blood tests. °· Urine tests. °· Imaging tests, such as an X-ray or CT scan. °How is this treated? °Treatment for this condition involves stopping marijuana use. Your health care provider may recommend: °· A drug rehabilitation program, if you have trouble stopping marijuana use. °· Medicines for nausea. °· Hot showers to help relieve symptoms. °Certain creams that contain a substance called  capsaicin may improve symptoms when applied to the abdomen. Ask your health care provider before starting any medicines or other treatments. °Severe nausea and vomiting may require you to stay at the hospital. You may need IV fluids to prevent or treat dehydration. You may also need certain medicines that must be given at the hospital. °Follow these instructions at home: °During an attack ° °· Stay in bed and rest in a dark, quiet room. °· Take anti-nausea medicine as told by your health care provider. °· Try taking hot showers to relieve your symptoms. °After an attack °· Drink small amounts of clear fluids slowly. Gradually add more. °· Once you are able to eat without vomiting, eat soft foods in small amounts every 3-4 hours. °General instructions ° °· Do not use any products that contain marijuana.If you need help quitting, ask your health care provider for resources and treatment options. °· Drink enough fluid to keep your urine pale yellow. Avoid drinking fluids that have a lot of sugar or caffeine, such as coffee and soda. °· Take and apply over-the-counter and prescription medicines only as told by your health care provider. Ask your health care provider before starting any new medicines or treatments. °· Keep all follow-up visits as told by your health care provider. This is important. °Contact a health care provider if: °· Your symptoms get worse. °· You cannot drink fluids without vomiting. °· You have pain and trouble swallowing after an attack. °Get help right away if: °· You cannot stop vomiting. °· You have blood in your vomit or your vomit looks like coffee grounds. °· You have   severe abdominal pain.  You have stools that are bloody or black, or stools that look like tar.  You have symptoms of dehydration, such as: ? Sunken eyes. ? Inability to make tears. ? Cracked lips. ? Dry mouth. ? Decreased urine production. ? Weakness. ? Sleepiness. ? Fainting. Summary  Cannabinoid hyperemesis  syndrome (CHS) is a condition that causes repeated nausea, vomiting, and abdominal pain after long-term use of marijuana.  People with CHS typically use marijuana 3-5 times a day for many years before they have symptoms, although it is possible to develop CHS with as little as 1 use per day.  Treatment for this condition involves stopping marijuana use. Hot showers and capsaicin creams may also help relieve symptoms. Ask your health care provider before starting any medicines or other treatments.  Your health care provider may prescribe medicines to help with nausea.  Get help right away if you have signs of dehydration, such as dry mouth, decreased urine production, or weakness. This information is not intended to replace advice given to you by your health care provider. Make sure you discuss any questions you have with your health care provider. Document Released: 05/10/2016 Document Revised: 05/10/2016 Document Reviewed: 05/10/2016 Elsevier Interactive Patient Education  2019 [employer]  Morning Sickness  Morning sickness is when a woman feels nauseous during pregnancy. This nauseous feeling may or may not come with vomiting. It often occurs in the morning, but it can be a problem at any time of day. Morning sickness is most common during the first trimester. In some cases, it may continue throughout pregnancy. Although morning sickness is unpleasant, it is usually harmless unless the woman develops severe and continual vomiting (hyperemesis gravidarum), a condition that requires more intense treatment. What are the causes? The exact cause of this condition is not known, but it seems to be related to normal hormonal changes that occur in pregnancy. What increases the risk? You are more likely to develop this condition if:  You experienced nausea or vomiting before your pregnancy.  You had morning sickness during a previous pregnancy.  You are pregnant with more than one baby, such as  twins. What are the signs or symptoms? Symptoms of this condition include:  Nausea.  Vomiting. How is this diagnosed? This condition is usually diagnosed based on your signs and symptoms. How is this treated? In many cases, treatment is not needed for this condition. Making some changes to what you eat may help to control symptoms. Your health care provider may also prescribe or recommend:  Vitamin B6 supplements.  Anti-nausea medicines.  Ginger. Follow these instructions at home: Medicines  Take over-the-counter and prescription medicines only as told by your health care provider. Do not use any prescription, over-the-counter, or herbal medicines for morning sickness without first talking with your health care provider.  Taking multivitamins before getting pregnant can prevent or decrease the severity of morning sickness in most women. Eating and drinking  Eat a piece of dry toast or crackers before getting out of bed in the morning.  Eat 5 or 6 small meals a day.  Eat dry and bland foods, such as rice or a baked potato. Foods that are high in carbohydrates are often helpful.  Avoid greasy, fatty, and spicy foods.  Have someone cook for you if the smell of any food causes nausea and vomiting.  If you feel nauseous after taking prenatal vitamins, take the vitamins at night or with a snack.  Snack on protein foods between  meals if you are hungry. Nuts, yogurt, and cheese are good options.  Drink fluids throughout the day.  Try ginger ale made with real ginger, ginger tea made from fresh grated ginger, or ginger candies. General instructions  Do not use any products that contain nicotine or tobacco, such as cigarettes and e-cigarettes. If you need help quitting, ask your health care provider.  Get an air purifier to keep the air in your house free of odors.  Get plenty of fresh air.  Try to avoid odors that trigger your nausea.  Consider trying these methods to help  relieve symptoms: ? Wearing an acupressure wristband. These wristbands are often worn for seasickness. ? Acupuncture. Contact a health care provider if:  Your home remedies are not working and you need medicine.  You feel dizzy or light-headed.  You are losing weight. Get help right away if:  You have persistent and uncontrolled nausea and vomiting.  You faint.  You have severe pain in your abdomen. Summary  Morning sickness is when a woman feels nauseous during pregnancy. This nauseous feeling may or may not come with vomiting.  Morning sickness is most common during the first trimester.  It often occurs in the morning, but it can be a problem at any time of day.  In many cases, treatment is not needed for this condition. Making some changes to what you eat may help to control symptoms. This information is not intended to replace advice given to you by your health care provider. Make sure you discuss any questions you have with your health care provider. Document Released: 03/23/2006 Document Revised: 03/04/2016 Document Reviewed: 03/04/2016 Elsevier Interactive Patient Education  2019 [employer].  Preterm Labor and Birth Information  The normal length of a pregnancy is 39-41 weeks. Preterm labor is when labor starts before 37 completed weeks of pregnancy. What are the risk factors for preterm labor? Preterm labor is more likely to occur in women who:  Have certain infections during pregnancy such as a bladder infection, sexually transmitted infection, or infection inside the uterus (chorioamnionitis).  Have a shorter-than-normal cervix.  Have gone into preterm labor before.  Have had surgery on their cervix.  Are younger than age 80 or older than age 55.  Are African American.  Are pregnant with twins or multiple babies (multiple gestation).  Take street drugs or smoke while pregnant.  Do not gain enough weight while pregnant.  Became pregnant shortly after  having been pregnant. What are the symptoms of preterm labor? Symptoms of preterm labor include:  Cramps similar to those that can happen during a menstrual period. The cramps may happen with diarrhea.  Pain in the abdomen or lower back.  Regular uterine contractions that may feel like tightening of the abdomen.  A feeling of increased pressure in the pelvis.  Increased watery or bloody mucus discharge from the vagina.  Water breaking (ruptured amniotic sac). Why is it important to recognize signs of preterm labor? It is important to recognize signs of preterm labor because babies who are born prematurely may not be fully developed. This can put them at an increased risk for:  Long-term (chronic) heart and lung problems.  Difficulty immediately after birth with regulating body systems, including blood sugar, body temperature, heart rate, and breathing rate.  Bleeding in the brain.  Cerebral palsy.  Learning difficulties.  Death. These risks are highest for babies who are born before 34 weeks of pregnancy. How is preterm labor treated? Treatment depends on  the length of your pregnancy, your condition, and the health of your baby. It may involve:  Having a stitch (suture) placed in your cervix to prevent your cervix from opening too early (cerclage).  Taking or being given medicines, such as: ? Hormone medicines. These may be given early in pregnancy to help support the pregnancy. ? Medicine to stop contractions. ? Medicines to help mature the babys lungs. These may be prescribed if the risk of delivery is high. ? Medicines to prevent your baby from developing cerebral palsy. If the labor happens before 34 weeks of pregnancy, you may need to stay in the hospital. What should I do if I think I am in preterm labor? If you think that you are going into preterm labor, call your health care provider right away. How can I prevent preterm labor in future pregnancies? To increase  your chance of having a full-term pregnancy:  Do not use any tobacco products, such as cigarettes, chewing tobacco, and e-cigarettes. If you need help quitting, ask your health care provider.  Do not use street drugs or medicines that have not been prescribed to you during your pregnancy.  Talk with your health care provider before taking any herbal supplements, even if you have been taking them regularly.  Make sure you gain a healthy amount of weight during your pregnancy.  Watch for infection. If you think that you might have an infection, get it checked right away.  Make sure to tell your health care provider if you have gone into preterm labor before. This information is not intended to replace advice given to you by your health care provider. Make sure you discuss any questions you have with your health care provider. Document Released: 04/22/2003 Document Revised: 07/13/2015 Document Reviewed: 06/23/2015 Elsevier Interactive Patient Education  2019 [employer]

## 2018-07-01 ENCOUNTER — Encounter: Payer: Self-pay | Admitting: *Deleted

## 2018-07-01 ENCOUNTER — Telehealth: Payer: Self-pay | Admitting: *Deleted

## 2018-07-01 NOTE — Telephone Encounter (Signed)
Left message for pt regarding her appointment on 5/21 for follow up from MAU visit.

## 2018-07-04 ENCOUNTER — Ambulatory Visit (INDEPENDENT_AMBULATORY_CARE_PROVIDER_SITE_OTHER): Payer: Medicaid Other | Admitting: Obstetrics & Gynecology

## 2018-07-04 ENCOUNTER — Other Ambulatory Visit: Payer: Self-pay

## 2018-07-04 VITALS — BP 118/79 | HR 72 | Wt 134.1 lb

## 2018-07-04 DIAGNOSIS — Z3A34 34 weeks gestation of pregnancy: Secondary | ICD-10-CM

## 2018-07-04 DIAGNOSIS — O3433 Maternal care for cervical incompetence, third trimester: Secondary | ICD-10-CM

## 2018-07-04 DIAGNOSIS — O0993 Supervision of high risk pregnancy, unspecified, third trimester: Secondary | ICD-10-CM

## 2018-07-04 DIAGNOSIS — O24410 Gestational diabetes mellitus in pregnancy, diet controlled: Secondary | ICD-10-CM

## 2018-07-04 MED ORDER — GLUCOSE BLOOD VI STRP: ORAL_STRIP | 12 refills | Status: DC

## 2018-07-04 NOTE — Patient Instructions (Signed)
Return to office for any scheduled appointments. Call the office or go to the [facility]U at [facility] [place] if:  You begin to have strong, frequent contractions  Your water breaks.  Sometimes it is a big gush of fluid, sometimes it is just a trickle that keeps getting your panties wet or running down your legs  You have vaginal bleeding.  It is normal to have a small amount of spotting if your cervix was checked.   You do not feel your baby moving like normal.  If you do not, get something to eat and drink and lay down and focus on feeling your baby move.   If your baby is still not moving like normal, you should call the office or go to MAU.  Any other obstetric concerns.   

## 2018-07-04 NOTE — Progress Notes (Signed)
   PRENATAL VISIT NOTE  Subjective:  Alexa White is a 33 y.o. [phone] at [redacted]w[redacted]d being seen today for ongoing prenatal care.  She is currently monitored for the following issues for this high-risk pregnancy and has Supervision of high-risk pregnancy, third trimester; Anxiety and depression; and GDM (gestational diabetes mellitus) on their problem list.  Patient reports no complaints. Was seen on 06/28/18 in MAU, noted to be almost 3 cm dilated which was an incidental finding, as she was being evaluated for GI concerns.  She was told to follow up here within the week.   Contractions: Not present. Vag. Bleeding: None.  Movement: Present. Denies leaking of fluid.   The following portions of the patient's history were reviewed and updated as appropriate: allergies, current medications, past family history, past medical history, past social history, past surgical history and problem list.   Objective:   Vitals:   07/04/18 1424  BP: 118/79  Pulse: 72  Weight: 134 lb 1.6 oz (60.8 kg)    Fetal Status: Fetal Heart Rate (bpm): 132 Fundal Height: 33 cm Movement: Present     General:  Alert, oriented and cooperative. Patient is in no acute distress.  Skin: Skin is warm and dry. No rash noted.   Cardiovascular: Normal heart rate noted  Respiratory: Normal respiratory effort, no problems with respiration noted  Abdomen: Soft, gravid, appropriate for gestational age.  Pain/Pressure: Present     Pelvic: Cervical exam performed Dilation: 2.5 Effacement (%): 70 Station: -2  Extremities: Normal range of motion.  Edema: None  Mental Status: Normal mood and affect. Normal behavior. Normal judgment and thought content.   Assessment and Plan:  Pregnancy: [id number] at [redacted]w[redacted]d 1. Premature cervical dilation, third trimester No progressing PTL, precautions advised.   2. Diet controlled gestational diabetes mellitus (GDM) in third trimester BS reviewed on meter, all within range. Continue diet control.  Growth  scan scheduled for next month.  3. Supervision of high-risk pregnancy, third trimester Preterm labor symptoms and general obstetric precautions including but not limited to vaginal bleeding, contractions, leaking of fluid and fetal movement were reviewed in detail with the patient. Please refer to After Visit Summary for other counseling recommendations.   Return in about 2 weeks (around 07/18/2018) for OFFICE OB Visit, Pelvic cultures.  Future Appointments  Date Time Provider Department Center  07/04/2018  3:45 PM , Jethro Bastos, MD [facility]  07/05/2018 11:45 AM Darrick Grinder, RD [facility] None  07/18/2018  2:00 PM Waldron Bing, MD [facility]  07/23/2018  2:30 PM [facility] NURSE [facility]  07/23/2018  2:30 PM [facility] [place] [facility]    Jaynie Collins, MD

## 2018-07-05 ENCOUNTER — Encounter: Payer: Self-pay | Admitting: Dietician

## 2018-07-05 ENCOUNTER — Encounter: Payer: Medicaid Other | Attending: Obstetrics and Gynecology | Admitting: Dietician

## 2018-07-05 VITALS — BP 110/60 | Ht 64.0 in | Wt 133.3 lb

## 2018-07-05 DIAGNOSIS — O24410 Gestational diabetes mellitus in pregnancy, diet controlled: Secondary | ICD-10-CM | POA: Insufficient documentation

## 2018-07-05 DIAGNOSIS — Z3A00 Weeks of gestation of pregnancy not specified: Secondary | ICD-10-CM | POA: Diagnosis not present

## 2018-07-05 NOTE — Progress Notes (Signed)
   Patient's BG record indicates BGs are within goal ranges with fasting BGs ranging 68-88, and post-meal BGs ranging 85-120 + one reading of 144 after eating pasta meal followed by ice cream  Patient's diet recall indicates some meals above generally recommended carb intake, but BGs seem to remain within goal. Did advise patient to increase protein or low-carb vegetables rather than high carb foods to promote satiety while controlling BGs. Discussed including carbs when active during the day, and less when inactive.    Provided 1900kcal meal plan, and wrote individualized menus based on patient's food preferences.  Instructed patient on food safety, including avoidance of Listeriosis, and limiting mercury from fish.  Discussed importance of maintaining healthy lifestyle habits to reduce risk of Type 2 DM as well as Gestational DM with any future pregnancies.  Advised patient to use any remaining testing supplies to test some BGs after delivery, and to have BG tested ideally annually, as well as prior to attempting future pregnancies.

## 2018-07-05 NOTE — Patient Instructions (Signed)
   Continue to eat balanced meals with healthy carbs and protein together. Include low-carb, "free" veggies.   If your feel shaky, weak, like blood sugar is low, drink 4oz of juice or regular (not diet) soda, allow 10-15 minutes to help sugar come back up, then follow with a meal or snack within 30 minutes.

## 2018-07-10 ENCOUNTER — Encounter: Payer: Medicaid Other | Admitting: Family Medicine

## 2018-07-11 ENCOUNTER — Other Ambulatory Visit: Payer: Self-pay | Admitting: *Deleted

## 2018-07-11 ENCOUNTER — Encounter: Payer: Self-pay | Admitting: *Deleted

## 2018-07-11 MED ORDER — ONDANSETRON 4 MG PO TBDP: 4.0000 mg | ORAL_TABLET | Freq: Three times a day (TID) | ORAL | 0 refills | Status: DC | PRN

## 2018-07-11 MED ORDER — TERCONAZOLE 0.8 % VA CREA: 1.0000 | TOPICAL_CREAM | Freq: Every day | VAGINAL | 0 refills | Status: DC

## 2018-07-11 NOTE — Telephone Encounter (Signed)
Pt left message regarding needing note for work to keep her more confined due to Covid at work. Also requested refill on meds.

## 2018-07-18 ENCOUNTER — Ambulatory Visit (INDEPENDENT_AMBULATORY_CARE_PROVIDER_SITE_OTHER): Payer: Medicaid Other | Admitting: Obstetrics and Gynecology

## 2018-07-18 ENCOUNTER — Other Ambulatory Visit (HOSPITAL_COMMUNITY)
Admission: RE | Admit: 2018-07-18 | Discharge: 2018-07-18 | Disposition: A | Discharge status: Home / Self Care | Payer: Medicaid Other | Source: Ambulatory Visit | Attending: Family Medicine | Admitting: Family Medicine

## 2018-07-18 ENCOUNTER — Other Ambulatory Visit: Payer: Self-pay

## 2018-07-18 VITALS — BP 114/73 | HR 73 | Wt 136.0 lb

## 2018-07-18 DIAGNOSIS — Z3A36 36 weeks gestation of pregnancy: Secondary | ICD-10-CM

## 2018-07-18 DIAGNOSIS — O24410 Gestational diabetes mellitus in pregnancy, diet controlled: Secondary | ICD-10-CM

## 2018-07-18 DIAGNOSIS — O0993 Supervision of high risk pregnancy, unspecified, third trimester: Secondary | ICD-10-CM | POA: Insufficient documentation

## 2018-07-18 NOTE — Progress Notes (Signed)
Patient reports blood sugars have been stable. Did not being blood sugar reading today.

## 2018-07-18 NOTE — Progress Notes (Signed)
Swelling in legs. 

## 2018-07-18 NOTE — Progress Notes (Signed)
Prenatal Visit Note Date: 07/18/2018 Clinic: [facility]  Subjective:  Alexa White is a 33 y.o. [phone] at [redacted]w[redacted]d being seen today for ongoing prenatal care.  She is currently monitored for the following issues for this high-risk pregnancy and has Supervision of high-risk pregnancy, third trimester; Anxiety and depression; and GDM (gestational diabetes mellitus) on their problem list.  Patient reports sometimes right leg numbness. occasional episodes of feeling weak and dizzy with ear ringing. .   Contractions: Not present. Vag. Bleeding: None.  Movement: Present. Denies leaking of fluid.   The following portions of the patient's history were reviewed and updated as appropriate: allergies, current medications, past family history, past medical history, past social history, past surgical history and problem list. Problem list updated.  Objective:   Vitals:   07/18/18 1401  BP: 114/73  Pulse: 73  Weight: 136 lb (61.7 kg)    Fetal Status: Fetal Heart Rate (bpm): 145   Movement: Present  Presentation: Vertex  General:  Alert, oriented and cooperative. Patient is in no acute distress.  Skin: Skin is warm and dry. No rash noted.   Cardiovascular: Normal heart rate noted  Respiratory: Normal respiratory effort, no problems with respiration noted  Abdomen: Soft, gravid, appropriate for gestational age. Pain/Pressure: Present     Pelvic:  Cervical exam deferred        Extremities: Normal range of motion.  Edema: None  Mental Status: Normal mood and affect. Normal behavior. Normal judgment and thought content.  Neuro: CN 2-12 grossly intact, PEERL, EOMI, normal range of motion and sensation, 5/5 strength diffusely, normal gait Urinalysis:      Assessment and Plan:  Pregnancy: [id number] at [redacted]w[redacted]d  1. Diet controlled gestational diabetes mellitus (GDM) in third trimester Normal BS control with diet  2. Supervision of high-risk pregnancy, third trimester Routine care.  Normal neuro exam. If s/s persist, can refer to neuro postpartum. Recommend belly binder as s/s could be from vascular compression and recommend walking frequently and not do prolonged sitting at work. Also recommend check CBG with s/s.  - Strep Gp B NAA - Cervicovaginal ancillary only( [place])  Preterm labor symptoms and general obstetric precautions including but not limited to vaginal bleeding, contractions, leaking of fluid and fetal movement were reviewed in detail with the patient. Please refer to After Visit Summary for other counseling recommendations.  Return in about 11 days (around 07/29/2018) for 11-13d rob.   [place] Bing, MD

## 2018-07-19 LAB — CERVICOVAGINAL ANCILLARY ONLY
Chlamydia: NEGATIVE
Neisseria Gonorrhea: NEGATIVE

## 2018-07-20 LAB — STREP GP B NAA: Strep Gp B NAA: NEGATIVE

## 2018-07-23 ENCOUNTER — Other Ambulatory Visit: Payer: Self-pay | Admitting: Obstetrics & Gynecology

## 2018-07-23 ENCOUNTER — Other Ambulatory Visit: Payer: Self-pay

## 2018-07-23 ENCOUNTER — Encounter (HOSPITAL_COMMUNITY): Payer: Self-pay | Admitting: *Deleted

## 2018-07-23 ENCOUNTER — Ambulatory Visit (HOSPITAL_COMMUNITY): Payer: Medicaid Other | Admitting: *Deleted

## 2018-07-23 ENCOUNTER — Ambulatory Visit (HOSPITAL_COMMUNITY)
Admission: RE | Admit: 2018-07-23 | Discharge: 2018-07-23 | Disposition: A | Discharge status: Home / Self Care | Payer: Medicaid Other | Source: Ambulatory Visit | Attending: Obstetrics and Gynecology | Admitting: Obstetrics and Gynecology

## 2018-07-23 DIAGNOSIS — O0993 Supervision of high risk pregnancy, unspecified, third trimester: Secondary | ICD-10-CM

## 2018-07-23 DIAGNOSIS — Z362 Encounter for other antenatal screening follow-up: Secondary | ICD-10-CM | POA: Diagnosis not present

## 2018-07-23 DIAGNOSIS — O24410 Gestational diabetes mellitus in pregnancy, diet controlled: Secondary | ICD-10-CM | POA: Diagnosis not present

## 2018-07-23 DIAGNOSIS — Z3A37 37 weeks gestation of pregnancy: Secondary | ICD-10-CM | POA: Diagnosis not present

## 2018-07-23 NOTE — Procedures (Signed)
Alexa White 10-19-1985 [redacted]w[redacted]d  Fetus A Non-Stress Test Interpretation for 07/23/18  Indication: GDM  Fetal Heart Rate A Mode: External Baseline Rate (A): 125 bpm Variability: Moderate Accelerations: 15 x 15 Decelerations: None Multiple birth?: No  Uterine Activity Mode: Toco Contraction Frequency (min): 2-5 Contraction Duration (sec): 60-120 Contraction Quality: Mild Resting Tone Palpated: Relaxed Resting Time: Adequate  Interpretation (Fetal Testing) Nonstress Test Interpretation: Reactive Comments: FHR tracing rev'd by Dr. Donalee Citrin

## 2018-07-24 ENCOUNTER — Other Ambulatory Visit (HOSPITAL_COMMUNITY): Payer: Self-pay | Admitting: *Deleted

## 2018-07-24 DIAGNOSIS — O24410 Gestational diabetes mellitus in pregnancy, diet controlled: Secondary | ICD-10-CM

## 2018-07-26 ENCOUNTER — Inpatient Hospital Stay (HOSPITAL_COMMUNITY)
Admission: AD | Admit: 2018-07-26 | Discharge: 2018-07-27 | DRG: 797 | Disposition: A | Discharge status: Home / Self Care | Payer: Medicaid Other | Attending: Obstetrics and Gynecology | Admitting: Obstetrics and Gynecology

## 2018-07-26 ENCOUNTER — Encounter (HOSPITAL_COMMUNITY)
Admission: AD | Disposition: A | Discharge status: Home / Self Care | Payer: Self-pay | Source: Home / Self Care | Attending: Obstetrics and Gynecology

## 2018-07-26 ENCOUNTER — Encounter (HOSPITAL_COMMUNITY): Payer: Self-pay

## 2018-07-26 ENCOUNTER — Inpatient Hospital Stay (HOSPITAL_COMMUNITY): Payer: Medicaid Other | Admitting: Anesthesiology

## 2018-07-26 ENCOUNTER — Telehealth: Payer: Self-pay | Admitting: Radiology

## 2018-07-26 ENCOUNTER — Telehealth: Payer: Self-pay | Admitting: Obstetrics and Gynecology

## 2018-07-26 DIAGNOSIS — F329 Major depressive disorder, single episode, unspecified: Secondary | ICD-10-CM | POA: Diagnosis present

## 2018-07-26 DIAGNOSIS — O99324 Drug use complicating childbirth: Secondary | ICD-10-CM | POA: Diagnosis present

## 2018-07-26 DIAGNOSIS — Z302 Encounter for sterilization: Secondary | ICD-10-CM | POA: Diagnosis not present

## 2018-07-26 DIAGNOSIS — Z1159 Encounter for screening for other viral diseases: Secondary | ICD-10-CM

## 2018-07-26 DIAGNOSIS — D649 Anemia, unspecified: Secondary | ICD-10-CM | POA: Diagnosis present

## 2018-07-26 DIAGNOSIS — F1290 Cannabis use, unspecified, uncomplicated: Secondary | ICD-10-CM | POA: Diagnosis present

## 2018-07-26 DIAGNOSIS — O9902 Anemia complicating childbirth: Secondary | ICD-10-CM | POA: Diagnosis present

## 2018-07-26 DIAGNOSIS — O26893 Other specified pregnancy related conditions, third trimester: Secondary | ICD-10-CM | POA: Diagnosis present

## 2018-07-26 DIAGNOSIS — F4310 Post-traumatic stress disorder, unspecified: Secondary | ICD-10-CM | POA: Diagnosis present

## 2018-07-26 DIAGNOSIS — O24419 Gestational diabetes mellitus in pregnancy, unspecified control: Secondary | ICD-10-CM | POA: Diagnosis present

## 2018-07-26 DIAGNOSIS — O99344 Other mental disorders complicating childbirth: Secondary | ICD-10-CM | POA: Diagnosis present

## 2018-07-26 DIAGNOSIS — Z3A37 37 weeks gestation of pregnancy: Secondary | ICD-10-CM

## 2018-07-26 DIAGNOSIS — Z87891 Personal history of nicotine dependence: Secondary | ICD-10-CM | POA: Diagnosis not present

## 2018-07-26 DIAGNOSIS — O24420 Gestational diabetes mellitus in childbirth, diet controlled: Principal | ICD-10-CM | POA: Diagnosis present

## 2018-07-26 DIAGNOSIS — O3670X0 Maternal care for viable fetus in abdominal pregnancy, unspecified trimester, not applicable or unspecified: Secondary | ICD-10-CM | POA: Insufficient documentation

## 2018-07-26 DIAGNOSIS — F419 Anxiety disorder, unspecified: Secondary | ICD-10-CM | POA: Diagnosis present

## 2018-07-26 HISTORY — PX: TUBAL LIGATION: SHX77

## 2018-07-26 LAB — SARS CORONAVIRUS 2: SARS Coronavirus 2: NOT DETECTED

## 2018-07-26 LAB — CBC
HCT: 30.3 % — ABNORMAL LOW (ref 36.0–46.0)
Hemoglobin: 10.3 g/dL — ABNORMAL LOW (ref 12.0–15.0)
MCH: 33.7 pg (ref 26.0–34.0)
MCHC: 34 g/dL (ref 30.0–36.0)
MCV: 99 fL (ref 80.0–100.0)
Platelets: 194 10*3/uL (ref 150–400)
RBC: 3.06 MIL/uL — ABNORMAL LOW (ref 3.87–5.11)
RDW: 12.2 % (ref 11.5–15.5)
WBC: 17.7 10*3/uL — ABNORMAL HIGH (ref 4.0–10.5)
nRBC: 0 % (ref 0.0–0.2)

## 2018-07-26 SURGERY — LIGATION, FALLOPIAN TUBE, POSTPARTUM
Anesthesia: Spinal

## 2018-07-26 MED ORDER — FAMOTIDINE 20 MG PO TABS
40.0000 mg | ORAL_TABLET | Freq: Once | ORAL | Status: AC
  Administered 2018-07-26: 40 mg via ORAL
  Filled 2018-07-26: qty 2

## 2018-07-26 MED ORDER — BUPIVACAINE IN DEXTROSE 0.75-8.25 % IT SOLN
INTRATHECAL | Status: DC | PRN
  Administered 2018-07-26: 1.4 mL via INTRATHECAL

## 2018-07-26 MED ORDER — LACTATED RINGERS IV SOLN
INTRAVENOUS | Status: DC
  Administered 2018-07-26: 10:00:00 via INTRAVENOUS
  Administered 2018-07-26: 10 mL/h via INTRAVENOUS

## 2018-07-26 MED ORDER — ONDANSETRON HCL 4 MG/2ML IJ SOLN
INTRAMUSCULAR | Status: DC | PRN
  Administered 2018-07-26: 4 mg via INTRAVENOUS

## 2018-07-26 MED ORDER — OXYTOCIN 10 UNIT/ML IJ SOLN
10.0000 [IU] | Freq: Once | INTRAMUSCULAR | Status: AC
  Administered 2018-07-26: 01:00:00 10 [IU] via INTRAMUSCULAR

## 2018-07-26 MED ORDER — FENTANYL CITRATE (PF) 100 MCG/2ML IJ SOLN
INTRAMUSCULAR | Status: AC
  Filled 2018-07-26: qty 2

## 2018-07-26 MED ORDER — PRENATAL MULTIVITAMIN CH
1.0000 | ORAL_TABLET | Freq: Every day | ORAL | Status: DC
  Administered 2018-07-27: 1 via ORAL
  Filled 2018-07-26: qty 1

## 2018-07-26 MED ORDER — BUPIVACAINE HCL (PF) 0.25 % IJ SOLN
INTRAMUSCULAR | Status: AC
  Filled 2018-07-26: qty 10

## 2018-07-26 MED ORDER — FENTANYL CITRATE (PF) 100 MCG/2ML IJ SOLN: 25.0000 ug | INTRAMUSCULAR | Status: DC | PRN

## 2018-07-26 MED ORDER — ACETAMINOPHEN 325 MG PO TABS
650.0000 mg | ORAL_TABLET | ORAL | Status: DC | PRN
  Administered 2018-07-26 – 2018-07-27 (×2): 650 mg via ORAL
  Filled 2018-07-26 (×4): qty 2

## 2018-07-26 MED ORDER — PROPOFOL 10 MG/ML IV BOLUS
INTRAVENOUS | Status: AC
  Filled 2018-07-26: qty 20

## 2018-07-26 MED ORDER — LIDOCAINE 2% (20 MG/ML) 5 ML SYRINGE
INTRAMUSCULAR | Status: AC
  Filled 2018-07-26: qty 5

## 2018-07-26 MED ORDER — METHYLERGONOVINE MALEATE 0.2 MG/ML IJ SOLN
0.2000 mg | Freq: Once | INTRAMUSCULAR | Status: AC
  Administered 2018-07-26: 0.2 mg via INTRAMUSCULAR
  Filled 2018-07-26: qty 1

## 2018-07-26 MED ORDER — DOCUSATE SODIUM 100 MG PO CAPS
100.0000 mg | ORAL_CAPSULE | Freq: Two times a day (BID) | ORAL | Status: DC
  Administered 2018-07-26 – 2018-07-27 (×2): 100 mg via ORAL
  Filled 2018-07-26 (×2): qty 1

## 2018-07-26 MED ORDER — SIMETHICONE 80 MG PO CHEW
80.0000 mg | CHEWABLE_TABLET | ORAL | Status: DC | PRN
  Administered 2018-07-26: 80 mg via ORAL
  Filled 2018-07-26: qty 1

## 2018-07-26 MED ORDER — LABETALOL HCL 5 MG/ML IV SOLN
20.0000 mg | Freq: Once | INTRAVENOUS | Status: AC
  Administered 2018-07-26: 20 mg via INTRAVENOUS

## 2018-07-26 MED ORDER — OXYTOCIN 40 UNITS IN NORMAL SALINE INFUSION - SIMPLE MED
INTRAVENOUS | Status: AC
  Administered 2018-07-26: 500 mL via INTRAVENOUS
  Filled 2018-07-26: qty 1000

## 2018-07-26 MED ORDER — MIDAZOLAM HCL 2 MG/2ML IJ SOLN
INTRAMUSCULAR | Status: AC
  Filled 2018-07-26: qty 2

## 2018-07-26 MED ORDER — BENZOCAINE-MENTHOL 20-0.5 % EX AERO
1.0000 "application " | INHALATION_SPRAY | CUTANEOUS | Status: DC | PRN
  Administered 2018-07-26: 1 via TOPICAL
  Filled 2018-07-26: qty 56

## 2018-07-26 MED ORDER — KETOROLAC TROMETHAMINE 30 MG/ML IJ SOLN
INTRAMUSCULAR | Status: AC
  Filled 2018-07-26: qty 1

## 2018-07-26 MED ORDER — OXYTOCIN BOLUS FROM INFUSION
500.0000 mL | Freq: Once | INTRAVENOUS | Status: AC
  Administered 2018-07-26: 500 mL via INTRAVENOUS

## 2018-07-26 MED ORDER — ACETAMINOPHEN 10 MG/ML IV SOLN
INTRAVENOUS | Status: AC
  Filled 2018-07-26: qty 100

## 2018-07-26 MED ORDER — COCONUT OIL OIL: 1.0000 "application " | TOPICAL_OIL | Status: DC | PRN

## 2018-07-26 MED ORDER — PHENYLEPHRINE 40 MCG/ML (10ML) SYRINGE FOR IV PUSH (FOR BLOOD PRESSURE SUPPORT)
PREFILLED_SYRINGE | INTRAVENOUS | Status: AC
  Filled 2018-07-26: qty 10

## 2018-07-26 MED ORDER — ONDANSETRON HCL 4 MG PO TABS: 4.0000 mg | ORAL_TABLET | ORAL | Status: DC | PRN

## 2018-07-26 MED ORDER — KETOROLAC TROMETHAMINE 30 MG/ML IJ SOLN
30.0000 mg | Freq: Once | INTRAMUSCULAR | Status: AC
  Administered 2018-07-26: 30 mg via INTRAMUSCULAR

## 2018-07-26 MED ORDER — FLUOXETINE HCL 20 MG PO CAPS
40.0000 mg | ORAL_CAPSULE | Freq: Every day | ORAL | Status: DC
  Administered 2018-07-26: 40 mg via ORAL
  Filled 2018-07-26: qty 2

## 2018-07-26 MED ORDER — FENTANYL CITRATE (PF) 100 MCG/2ML IJ SOLN
INTRAMUSCULAR | Status: DC | PRN
  Administered 2018-07-26 (×4): 50 ug via INTRAVENOUS

## 2018-07-26 MED ORDER — PROMETHAZINE HCL 25 MG/ML IJ SOLN: 6.2500 mg | INTRAMUSCULAR | Status: DC | PRN

## 2018-07-26 MED ORDER — OXYCODONE HCL 5 MG PO TABS: 5.0000 mg | ORAL_TABLET | Freq: Once | ORAL | Status: AC | PRN

## 2018-07-26 MED ORDER — FENTANYL CITRATE (PF) 100 MCG/2ML IJ SOLN
100.0000 ug | Freq: Once | INTRAMUSCULAR | Status: AC
  Administered 2018-07-26: 100 ug via INTRAVENOUS
  Filled 2018-07-26: qty 2

## 2018-07-26 MED ORDER — DIBUCAINE (PERIANAL) 1 % EX OINT: 1.0000 "application " | TOPICAL_OINTMENT | CUTANEOUS | Status: DC | PRN

## 2018-07-26 MED ORDER — MEASLES, MUMPS & RUBELLA VAC IJ SOLR: 0.5000 mL | Freq: Once | INTRAMUSCULAR | Status: DC

## 2018-07-26 MED ORDER — OXYTOCIN 40 UNITS IN NORMAL SALINE INFUSION - SIMPLE MED
2.5000 [IU]/h | INTRAVENOUS | Status: DC
  Administered 2018-07-26: 2.5 [IU]/h via INTRAVENOUS

## 2018-07-26 MED ORDER — PROPOFOL 10 MG/ML IV BOLUS
INTRAVENOUS | Status: DC | PRN
  Administered 2018-07-26 (×2): 20 mg via INTRAVENOUS

## 2018-07-26 MED ORDER — ONDANSETRON HCL 4 MG/2ML IJ SOLN
4.0000 mg | INTRAMUSCULAR | Status: DC | PRN
  Administered 2018-07-26: 05:00:00 4 mg via INTRAVENOUS
  Filled 2018-07-26: qty 2

## 2018-07-26 MED ORDER — METOCLOPRAMIDE HCL 10 MG PO TABS
10.0000 mg | ORAL_TABLET | Freq: Once | ORAL | Status: AC
  Administered 2018-07-26: 10 mg via ORAL
  Filled 2018-07-26: qty 1

## 2018-07-26 MED ORDER — OXYCODONE HCL 5 MG/5ML PO SOLN
ORAL | Status: AC
  Filled 2018-07-26: qty 5

## 2018-07-26 MED ORDER — LABETALOL HCL 5 MG/ML IV SOLN
INTRAVENOUS | Status: AC
  Filled 2018-07-26: qty 4

## 2018-07-26 MED ORDER — WITCH HAZEL-GLYCERIN EX PADS: 1.0000 "application " | MEDICATED_PAD | CUTANEOUS | Status: DC | PRN

## 2018-07-26 MED ORDER — DIPHENHYDRAMINE HCL 25 MG PO CAPS: 25.0000 mg | ORAL_CAPSULE | Freq: Four times a day (QID) | ORAL | Status: DC | PRN

## 2018-07-26 MED ORDER — LIDOCAINE HCL (CARDIAC) PF 100 MG/5ML IV SOSY
PREFILLED_SYRINGE | INTRAVENOUS | Status: DC | PRN
  Administered 2018-07-26: 60 mg via INTRATRACHEAL

## 2018-07-26 MED ORDER — BUPIVACAINE HCL (PF) 0.25 % IJ SOLN
INTRAMUSCULAR | Status: DC | PRN
  Administered 2018-07-26: 10 mL

## 2018-07-26 MED ORDER — MIDAZOLAM HCL 5 MG/5ML IJ SOLN
INTRAMUSCULAR | Status: DC | PRN
  Administered 2018-07-26 (×2): 1 mg via INTRAVENOUS

## 2018-07-26 MED ORDER — OXYCODONE HCL 5 MG/5ML PO SOLN
5.0000 mg | Freq: Once | ORAL | Status: AC | PRN
  Administered 2018-07-26: 5 mg via ORAL

## 2018-07-26 MED ORDER — ACETAMINOPHEN 10 MG/ML IV SOLN
1000.0000 mg | Freq: Once | INTRAVENOUS | Status: DC | PRN
  Administered 2018-07-26: 1000 mg via INTRAVENOUS

## 2018-07-26 MED ORDER — IBUPROFEN 600 MG PO TABS
600.0000 mg | ORAL_TABLET | Freq: Four times a day (QID) | ORAL | Status: DC
  Administered 2018-07-26 – 2018-07-27 (×5): 600 mg via ORAL
  Filled 2018-07-26 (×5): qty 1

## 2018-07-26 SURGICAL SUPPLY — 20 items
CLOTH BEACON ORANGE TIMEOUT ST (SAFETY) ×3 IMPLANT
DRSG OPSITE POSTOP 3X4 (GAUZE/BANDAGES/DRESSINGS) ×3 IMPLANT
DURAPREP 26ML APPLICATOR (WOUND CARE) ×3 IMPLANT
GLOVE BIO SURGEON STRL SZ7 (GLOVE) ×3 IMPLANT
GLOVE BIOGEL PI IND STRL 7.0 (GLOVE) ×2 IMPLANT
GLOVE BIOGEL PI INDICATOR 7.0 (GLOVE) ×4
GOWN STRL REUS W/TWL LRG LVL3 (GOWN DISPOSABLE) ×6 IMPLANT
NEEDLE HYPO 22GX1.5 SAFETY (NEEDLE) ×3 IMPLANT
NS IRRIG 1000ML POUR BTL (IV SOLUTION) ×3 IMPLANT
PACK ABDOMINAL MINOR (CUSTOM PROCEDURE TRAY) ×3 IMPLANT
PROTECTOR NERVE ULNAR (MISCELLANEOUS) ×3 IMPLANT
SPONGE LAP 4X18 RFD (DISPOSABLE) IMPLANT
SUT MON AB 4-0 PS1 27 (SUTURE) ×3 IMPLANT
SUT VIC AB 0 CT1 27 (SUTURE) ×6
SUT VIC AB 0 CT1 27XBRD ANBCTR (SUTURE) ×1 IMPLANT
SUT VICRYL 4-0 PS2 18IN ABS (SUTURE) ×3 IMPLANT
SYR CONTROL 10ML LL (SYRINGE) ×3 IMPLANT
TOWEL OR 17X24 6PK STRL BLUE (TOWEL DISPOSABLE) ×6 IMPLANT
TRAY FOLEY CATH SILVER 14FR (SET/KITS/TRAYS/PACK) ×3 IMPLANT
WATER STERILE IRR 1000ML POUR (IV SOLUTION) ×3 IMPLANT

## 2018-07-26 NOTE — Lactation Note (Signed)
This note was copied from a baby's chart. Lactation Consultation Note  Patient Name: Alexa White [id number] Date: 07/26/2018 Reason for consult: Initial assessment;Early term 77-38.6wks  12 hours old early term female who is being partially BF and formula fed by his mother, she's a P3 and experienced BF. She was able to BF her first child for 3 weeks and her second one for 13 months, and her only BF difficulty was that she had to pump and bottle due to a difficult latch, but mom said she also supplemented with formula. She participated in the [facility] program at the [facility] and she's already familiar with hand expression and told LC she's been able to see colostrum already. Mom had a question about a black "dot" on the outside/border of her areola, she said she's had it for a while. Asked mom to check with her OB just in case. Mom's UDS is (+) for [facility], she had GDM during the pregnancy and is feeding baby Gerber Gentle, mom had an 8 pack box in her room.  Baby sleeping on mom's arms when entering the room, offered assistance with latch but mom politely declined, she said she'll call later when baby is ready to feed. She had a few questions about pumping and worker's rights, she said she works in a [employer] and wanted to know what her rights are for being a BF mother. LC explained those rights to mom, she's planning on getting a pump through [facility] but will also be feeding baby formula, last feeding was 15 ml. Reviewed formula supplementation guidelines according to baby's age in hours, size of baby's stomach, normal newborn behavior and feeding cues.  Feeding plan:  1. Encouraged mom to feed baby STS 8-12 times/24 hours or sooner if feeding cues are present 2. Hand expression and spoon feeding were also encouraged 3. Mom will continue supplementing baby with Gerber Gentle using a bottle with a slow flow nipple, as her preference. Will try to follow formula supplementation guidelines  BF brochure, BF resources  and feeding diary were reviewed. Mom reported all questions and concerns were answered, she's aware of [place] OP services and will call PRN.  Maternal Data Formula Feeding for Exclusion: Yes Reason for exclusion: Mother's choice to formula and breast feed on admission Has patient been taught Hand Expression?: Yes Does the patient have breastfeeding experience prior to this delivery?: Yes  Feeding Feeding Type: Breast Fed  LATCH Score Latch: Grasps breast easily, tongue down, lips flanged, rhythmical sucking.  Audible Swallowing: A few with stimulation  Type of Nipple: Everted at rest and after stimulation  Comfort (Breast/Nipple): Soft / non-tender  Hold (Positioning): Assistance needed to correctly position infant at breast and maintain latch.  LATCH Score: 8  Interventions Interventions: Breast feeding basics reviewed  Lactation Tools Discussed/Used WIC Program: Yes   Consult Status Consult Status: Follow-up Date: 07/27/18 Follow-up type: In-patient    Alexa White Alexa White 07/26/2018, 4:28 PM

## 2018-07-26 NOTE — Anesthesia Procedure Notes (Signed)
Spinal  Patient location during procedure: OR Start time: 07/26/2018 10:03 AM End time: 07/26/2018 10:05 AM Staffing Anesthesiologist: Brennan Bailey, MD Performed: anesthesiologist  Preanesthetic Checklist Completed: patient identified, site marked, pre-op evaluation, timeout performed, IV checked, risks and benefits discussed and monitors and equipment checked Spinal Block Patient position: sitting Prep: ChloraPrep Patient monitoring: heart rate, cardiac monitor and continuous pulse ox Approach: midline Location: L3-4 Injection technique: single-shot Needle Needle type: Pencan  Needle gauge: 24 G Needle length: 10 cm Additional Notes Risks, benefits, and alternative discussed. Patient gave consent to procedure. Prepped and draped in sitting position. Clear CSF obtained after one needle pass. Positive terminal aspiration. No pain or paraesthesias with injection. Patient tolerated procedure well. Vital signs stable. Tawny Asal, MD

## 2018-07-26 NOTE — H&P (Addendum)
OBSTETRIC ADMISSION HISTORY AND PHYSICAL  Subective Alexa White is a 33 y.o. female [phone] with IUP at 62w5dby U/S at [place] for active labor by EMS and baby crowning.  Reports fetal movement. Denies vaginal bleeding. Reports ROM at home earlier today with clear fluid. [facility]  Support person in labor: n/a  Ultrasounds . 17w4 -  No gross fetal structural defect. Wnl. Incomplete fetal anatomy . 21w5 - normal interval growth  . 37w2 - EFW 3523g >90%. AC >97%  Prenatal History/Complications: . GDM, diet controlled . THC use  Past Medical History:  Diagnosis Date  . Anemia   . Anxiety   . Depression    PTSD  . Former smoker    Quit- 06/01/14  . Gestational diabetes   . 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 nose repair     broken nose- had to be reset  . WISDOM TOOTH EXTRACTION  2011   OB History    Gravida  4   Para  2   Term  2   Preterm      AB  1   Living  2     SAB      TAB  1   Ectopic      Multiple  0   Live Births  2          Social History   Socioeconomic History  . Marital status: Single    Spouse name: Not on file  . Number of children: Not on file  . Years of education: Not on file  . Highest education level: Not on file  Occupational History  . Not on file  Social Needs  . Financial resource strain: Not on file  . Food insecurity    Worry: Not on file    Inability: Not on file  . Transportation needs    Medical: Not on file    Non-medical: Not on file  Tobacco Use  . Smoking status: Former Smoker    Packs/day: 1.00    Years: 3.00    Pack years: 3.00    Types: Cigarettes    Quit date: 06/01/2014    Years since quitting: 4.1  . Smokeless tobacco: Never Used  Substance and Sexual Activity  . Alcohol use: Not Currently    Comment: last used in October 2019  . Drug use: Yes    Types: Marijuana    Comment: last used in  October 2019  . Sexual activity: Yes    Birth control/protection: None    Comment: last used depo, unsure of when  Lifestyle  . Physical activity    Days per week: Not on file    Minutes per session: Not on file  . Stress: Not on file  Relationships  . Social [place] phone: Not on file    Gets together: Not on file    Attends religious service: Not on file    Active member of club or organization: Not on file    Attends meetings of clubs or organizations: Not on file    Relationship status: Not on file  Other Topics Concern  . Not on file  Social History Narrative  . Not on file   Family History  Problem Relation Age of Onset  . Mental illness Mother        bipolar   Allergies: No  Known Allergies Medications:  No outpatient medications have been marked as taking for the 07/26/18 encounter [facility] Encounter).   Review of Systems  All systems reviewed and negative except as stated in HPI  Objective Physical Exam:  Blood pressure 135/83, pulse 85, resp. rate 18, last menstrual period 10/25/2017, currently breastfeeding. General appearance: alert, cooperative, appears stated age and mild distress with contractions Lungs: no respiratory distress Heart: RRR. No BLEE.  Abdomen: soft, non-tender; gravid  Pelvic: Crowning Extremities: Moving spontaneously, warm, well perfused. 2+ DP. Uterine activity: Regular contractions Cervical Exam: n/a Presentation: cephalic Fetal monitoring: unable to obtain  Prenatal labs: ABO, Rh: O/Positive/-- (01/16 1545) Antibody: Negative (01/16 1545) Rubella: 2.32 (01/16 1545) RPR: Non Reactive (04/09 0813)  HBsAg: Negative (01/16 1545)  HIV: Non Reactive (04/09 0813)  GBS: Negative (06/04 1430)  Glucola: failed Genetic screening:  Not obtained  Prenatal Transfer Tool  Maternal Diabetes: Yes:  Diabetes Type:  Diet controlled Genetic Screening: Declined Maternal Ultrasounds/Referrals: Normal Fetal Ultrasounds or  other Referrals:  None Maternal Substance Abuse:  Yes:  Type: Marijuana Significant Maternal Medications:  None Significant Maternal Lab Results: None  No results found for this or any previous visit (from the past 24 hour(s)).  Patient Active Problem List   Diagnosis Date Noted  . GDM (gestational diabetes mellitus) 05/28/2018  . Anxiety and depression 05/23/2018  . Supervision of high-risk pregnancy, third trimester 02/20/2018    Assessment & Plan:  Alexa White is a 33 y.o. [id number] at [redacted]w[redacted]d- Spontaneous labor, progressing normally  Labor: Crowning on admission - delivered viable female- see deliver note . Pain control: none . Anticipated MOD: NSVD . PPH risk: unknown  Fetal Wellbeing: No FHR due to admission at crowning.  . GBS: Negative (06/04 1430)   Postpartum Planning . Girl/breast/BTL . Needs MMR   RZettie Cooley M.D.  Family Medicine  PGY-1 07/26/2018 1:39 AM  I confirm that I have verified the information documented in the resident's note and that I have also personally reperformed the physical exam and all medical decision making activities.  The patient was seen and examined by me also Agree with note Fetal head was crowning on arrival by EMS.  Did not stop to put her on monitor until we got to L&D, FHR stable  She delivered shortly after arrival  W[place]abron Spates [place]

## 2018-07-26 NOTE — Op Note (Addendum)
Alexa White November 19, 1985  PREOPERATIVE DIAGNOSIS:  Multiparity, undesired fertility POSTOPERATIVE DIAGNOSIS:  Multiparity, undesired fertility  PROCEDURE:  Postpartum Bilateral Tubal Sterilization using Filshie Clips   SURGEON: Dr.  Silas Sacramento ASSISTANT: Dr. Gerri Lins   ANESTHESIA:  Epidural and local analgesia using 5.1% Marcaine  COMPLICATIONS:  None immediate.  ESTIMATED BLOOD LOSS: 5 ml.   INDICATIONS: 33yo [id number] with undesired fertility,status post vaginal delivery, desires permanent sterilization.  Other reversible forms of contraception were discussed with patient; she declines all other modalities. Risks of procedure discussed with patient including but not limited to: risk of regret, permanence of method, bleeding, infection, injury to surrounding organs and need for additional procedures.  Failure risk of 0.5-1% with increased risk of ectopic gestation if pregnancy occurs was also discussed with patient.     FINDINGS:  Normal uterus, tubes, and ovaries.  PROCEDURE DETAILS: The patient was taken to the operating room where her epidural anesthesia was dosed up to surgical level and found to be adequate.  She was then placed in the dorsal supine position and prepped and draped in sterile fashion.  After an adequate timeout was performed, the subcutaneous tissue was injected with 10cc of 0.25% marcaine around the area to be incised. Attention was then turned to the patient's abdomen where a small vertical skin incision was made in the umbilical fold. The incision was taken down to the layer of fascia using the scalpel, and fascia was incised, and extended bilaterally using Mayo scissors. The peritoneum was entered in a sharp fashion. Attention was then turned to the patient's uterus, and right fallopian tube was identified and followed out to the fimbriated end.  A Filshie clip was placed on the left fallopian tube about 3 cm from the cornual attachment, with care given to  incorporate the underlying mesosalpinx.  A similar process was carried out on the left side allowing for bilateral tubal sterilization.  Good hemostasis was noted overall. The instruments were then removed from the patient's abdomen and the fascial incision was repaired with 0 Vicryl, and the skin was closed with a 4-0 Vicryl subcuticular stitch. The patient tolerated the procedure well.  Instrument, sponge, and needle counts were correct times two.  The patient was then taken to the recovery room awake and in stable condition.  Lambert Mody. Juleen China, [place] for [place] of Attending Supervision of Fellow: Evaluation and management procedures were performed by the Fellow under my supervision and collaboration. I was scrubbed for entire procedure.  I have reviewed the Fellow's note and chart, and I agree with the management and plan.  Silas Sacramento, MD

## 2018-07-26 NOTE — Anesthesia Preprocedure Evaluation (Addendum)
Anesthesia Evaluation  Patient identified by MRN, date of birth, ID band Patient awake    Reviewed: Allergy & Precautions, NPO status , Patient's Chart, lab work & pertinent test results  History of Anesthesia Complications Negative for: history of anesthetic complications  Airway Mallampati: I  TM Distance: >3 FB Neck ROM: Full    Dental  (+) Chipped,    Pulmonary former smoker,    Pulmonary exam normal        Cardiovascular negative cardio ROS Normal cardiovascular exam     Neuro/Psych Anxiety Depression negative neurological ROS     GI/Hepatic negative GI ROS, Neg liver ROS,   Endo/Other  diabetes, Gestational  Renal/GU negative Renal ROS     Musculoskeletal negative musculoskeletal ROS (+)   Abdominal   Peds  Hematology  (+) anemia , Hgb 10.3   Anesthesia Other Findings Day of surgery medications reviewed with the patient.  Reproductive/Obstetrics                            Anesthesia Physical Anesthesia Plan  ASA: II  Anesthesia Plan: Spinal   Post-op Pain Management:    Induction:   PONV Risk Score and Plan: 2 and Treatment may vary due to age or medical condition, Ondansetron and Dexamethasone  Airway Management Planned: Natural Airway and Simple Face Mask  Additional Equipment:   Intra-op Plan:   Post-operative Plan:   Informed Consent: I have reviewed the patients History and Physical, chart, labs and discussed the procedure including the risks, benefits and alternatives for the proposed anesthesia with the patient or authorized representative who has indicated his/her understanding and acceptance.     Dental advisory given  Plan Discussed with: CRNA  Anesthesia Plan Comments:        Anesthesia Quick Evaluation

## 2018-07-26 NOTE — Progress Notes (Signed)
Post Partum Day 0 Subjective: no complaints, up ad lib and voiding  Objective: Blood pressure 132/87, pulse 73, temperature 98.3 F (36.8 C), temperature source Oral, resp. rate 18, last menstrual period 10/25/2017, SpO2 98 %, unknown if currently breastfeeding.  Physical Exam:  General: alert, cooperative and no distress Lochia: appropriate Uterine Fundus: firm Incision: n/a DVT Evaluation: No evidence of DVT seen on physical exam.  Recent Labs    07/26/18 0609  HGB 10.3*  HCT 30.3*    Assessment/Plan:  Patient desires BTL.  Papers on chart. Patient desires permanent sterilization.  Other reversible forms of contraception were discussed with patient; she declines all other modalities. Risks of procedure discussed with patient including but not limited to: risk of regret, permanence of method, bleeding, infection, injury to surrounding organs and need for additional procedures.  Failure risk of 0.5-1% with increased risk of ectopic gestation if pregnancy occurs was also discussed with patient.    Patient verbalized understanding of these risks and wants to proceed with sterilization.  Written informed consent obtained.  To OR when ready.    LOS: 0 days   Silas Sacramento 07/26/2018, 9:10 AM

## 2018-07-26 NOTE — Telephone Encounter (Signed)
Left message with postpartum appointment date and time

## 2018-07-26 NOTE — Clinical Social Work Maternal (Signed)
CLINICAL SOCIAL WORK MATERNAL/CHILD NOTE  Patient Details  Name: Alexa White MRN: [id number] Date of Birth: [birth year]  Date:  07/26/2018  Clinical Social Worker Initiating Note:  Elijio Miles Date/Time: Initiated:  07/26/18/1302     Child's Name:  Alexa White   Biological Parents:  Mother, Father(Alexa White and [place] DOB: 09-21-87)   Need for Interpreter:  None   Reason for Referral:  Behavioral Health Concerns, Current Substance Use/Substance Use During Pregnancy    Address:  [place] [address] (mailing address)   [place], [place], [place] [address] (MOB reported she will be moving soon but will still be at the same address [address])    Phone number:  [phone] (home)     Additional phone number:   Household Members/Support Persons (HM/SP):   Household Member/Support Person 1, Household Member/Support Person 2   HM/SP Name Relationship DOB or Age  HM/SP -1 Korinne Greenstein Daughter 01/22/2015  HM/SP -2 Waynard Reeds Son 09/11/2005  HM/SP -3        HM/SP -4        HM/SP -5        HM/SP -6        HM/SP -7        HM/SP -8          Natural Supports (not living in the home):  Parent, Extended Family, Immediate Family, Spouse/significant other   Professional Supports: None   Employment: Full-time   Type of Work: [employer]   Education:  High school graduate   Homebound arranged:    Financial Resources:  Medicaid   Other Resources:  [facility]   Cultural/Religious Considerations Which May Impact Care:    Strengths:  Ability to meet basic needs , Home prepared for child , Pediatrician chosen,.    Psychotropic Medications:         Pediatrician:    [place]  Pediatrician List:   [facility] for [place]      Pediatrician Fax Number:    Risk  Factors/Current Problems:  Mental Health Concerns , Substance Use    Cognitive State:  Able to Concentrate , Alert , Linear Thinking    Mood/Affect:  Calm , Comfortable , Happy , Interested , Relaxed    CSW Assessment:  CSW received consult for history of anxiety and depression and for a history of THC use during pregnancy.  CSW met with MOB to offer support and complete assessment.    MOB resting in bed holding infant, when CSW entered the room. CSW introduced self and explained reason for consult to which MOB expressed understanding. MOB pleasant and easy to engage throughout assessment and was appropriate and attentive to infant during visit. MOB stated she currently lives alone with her 3 children. MOB reported highest level of education completed was 12th grade and stated she currently works full-time at [employer] as a Associate Professor. MOB confirmed she currently receives [facility] and is aware of process to get infant added on.   CSW inquired about MOB's mental health history and MOB acknowledged a history of anxiety, depression, PTSD and OCD. MOB stated her depression worsened after the birth of her daughter and noted symptoms of crying and irritability. MOB stated she is currently on medications (Fluoxetine 40 mg) and feels it is  effective in managing her symptoms and reported she has noticed more positive thinking. MOB stated she goes to [facility] for medication management and has received counseling from them in the past. MOB expressed issues with getting in touch with anyone at [facility] and requested additional counseling and medication management resources to which CSW provided a list. CSW also provided education regarding the baby blues period vs. perinatal mood disorders, discussed treatment and gave resources for mental health follow up if concerns arise.  CSW recommends self-evaluation during the postpartum time period using the New Mom Checklist from Postpartum  Progress and encouraged MOB to contact a medical professional if symptoms are noted at any time. MOB denied any current SI, HI or DV and reported having good support from FOB, FOB's mom and sisters, MOB's father and MOB's step-mother.   CSW inquired about MOB's substance use during pregnancy and MOB acknowledged using THC once during pregnancy. MOB stated she smoked regularly before finding out she was pregnant. MOB reported last use was in January to help with her nausea. CSW informed MOB of [place] and that infant's UDS came back positive for THC. CSW explained CSW would have to make CPS report due to positive results to which MOB expressed understanding and denied any questions. MOB reported prior CPS involvement due to an incident that had happened in 2008 with maternal grandparents but reported case was closed after one visit.   MOB confirmed having all essential items for infant once discharged and stated infant would be sleeping in a crib once home. CSW provided review of Sudden Infant Death Syndrome (SIDS) precautions and safe sleeping habits. MOB denied any further questions, concerns or need for resources at this time.  CSW made [facility] CPS report due to infant's positive UDS for THC. CPS to follow up with MOB within 72 hours.   CSW Plan/Description:  No Further Intervention Required/No Barriers to Discharge, Sudden Infant Death Syndrome (SIDS) Education, Perinatal Mood and Anxiety Disorder (PMADs) Education, [place], CSW Will Continue to Monitor Umbilical Cord Tissue Drug Screen Results and Make Report if Foye Spurling, [place] 07/26/2018, 4:29 PM

## 2018-07-26 NOTE — Telephone Encounter (Signed)
Telephone call to patient to notify her that she has potentially been exposed to an employee who later tested positive for [place]. Nafisah is currently inpatient at this time for delivery and was tested today for COVID with a negative result.  Her care team has been notified and she will be given instructions upon discharge to self-monitor for signs and symptoms as well as mask around people and follow good infection prevention techniques.

## 2018-07-27 ENCOUNTER — Encounter (HOSPITAL_COMMUNITY): Payer: Self-pay | Admitting: Obstetrics & Gynecology

## 2018-07-27 DIAGNOSIS — Z302 Encounter for sterilization: Secondary | ICD-10-CM

## 2018-07-27 MED ORDER — IBUPROFEN 600 MG PO TABS: 600.0000 mg | ORAL_TABLET | Freq: Four times a day (QID) | ORAL | 0 refills | Status: DC | PRN

## 2018-07-27 MED ORDER — OXYCODONE-ACETAMINOPHEN 5-325 MG PO TABS: 1.0000 | ORAL_TABLET | ORAL | 0 refills | Status: AC | PRN

## 2018-07-27 NOTE — Progress Notes (Signed)
CSW acknowledged consult for Edinburgh score of 10.  CSW is screening out consult due to clinical assessment completed on 07/26/18 by CSW M. Burcham addressing PMADs.  Resources and education was provided.   There are no barriers to discharge.   Laurey Arrow, MSW, LCSW Clinical Social Work [phone]

## 2018-07-27 NOTE — Discharge Summary (Signed)
OB Discharge Summary     Patient Name: Alexa White DOB: 04-15-1985 MRN: [id number]  Date of admission: 07/26/2018 Delivering MD: Genia HotterKIM, Alexa E   Date of discharge: 07/27/2018  Admitting diagnosis: No admission diagnoses are documented for this encounter. Intrauterine pregnancy: 7512w5d     Secondary diagnosis:  Active Problems:   Anxiety and depression   GDM (gestational diabetes mellitus)   Normal labor and delivery   Request for sterilization  Additional problems: none     Discharge diagnosis: Term Pregnancy Delivered and GDM A1                                                                                                Post partum procedures:postpartum tubal ligation  Augmentation: none  Complications: None  Hospital course:  Onset of Labor With Vaginal Delivery     [age] y.o. yo [phone] at 4512w5d was admitted in Active Labor on 07/26/2018, arrived complete and pushing via EMS. Patient had an uncomplicated labor course as follows:  Membrane Rupture Time/Date: 1:02 AM ,07/26/2018   Intrapartum Procedures: Episiotomy: None [1]                                         Lacerations:  1st degree [2];Vaginal [6]  Patient had a delivery of a Viable infant. 07/26/2018  Information for the patient's newborn:  Alexa White, Alexa White [[id number]][[id number]]  Delivery Method: Vaginal, Spontaneous(Filed from Delivery Summary)     Pateint had a postpartum course significant for having a ppBTL on PPD#0 and tolerating it well (see note).  She is ambulating, tolerating a regular diet, passing flatus, and urinating well. She has had some borderline BPs- no severe range and asymptomatic- msg sent for BP check via televisit in 1 wk. Patient is discharged home in stable condition on 07/27/18 per her request for early d/c as long as baby is stable to d/c as well.  Physical exam  Vitals:   07/27/18 0120 07/27/18 0501 07/27/18 0526 07/27/18 1020  BP: 123/82 (!) 131/91 130/90 126/83  Pulse: 77 (!) 57 81 81   Resp: 18 18 18 16   Temp: 98.4 F (36.9 C) 98.4 F (36.9 C) 98.4 F (36.9 C) 98.3 F (36.8 C)  TempSrc: Oral Oral Oral Oral  SpO2: 98%  99% 100%   General: alert and cooperative Lochia: appropriate Uterine Fundus: firm Incision: honeycomb on umbilicus; sm stain>unchanged DVT Evaluation: No evidence of DVT seen on physical exam. Labs: Lab Results  Component Value Date   WBC 17.7 (H) 07/26/2018   HGB 10.3 (L) 07/26/2018   HCT 30.3 (L) 07/26/2018   MCV 99.0 07/26/2018   PLT 194 07/26/2018   CMP Latest Ref Rng & Units 06/28/2018  Glucose 70 - 99 mg/dL 74  BUN 6 - 20 mg/dL 8  Creatinine 1.300.44 - 8.651.00 mg/dL 7.840.46  Sodium 696135 - 295145 mmol/L 137  Potassium 3.5 - 5.1 mmol/L 3.4(L)  Chloride 98 - 111 mmol/L 104  CO2 22 - 32 mmol/L 21(L)  Calcium 8.9 - 10.3 mg/dL 8.6(L)  Total Protein 6.5 - 8.1 g/dL 6.0(L)  Total Bilirubin 0.3 - 1.2 mg/dL 0.5  Alkaline Phos 38 - 126 U/L 147(H)  AST 15 - 41 U/L 15  ALT 0 - 44 U/L 15    Discharge instruction: per After Visit Summary and "Baby and Me Booklet".  After visit meds:  Allergies as of 07/27/2018   No Known Allergies     Medication List    STOP taking these medications   Accu-Chek FastClix Lancets Misc   acetaminophen 500 MG tablet Commonly known as: TYLENOL   calcium carbonate 500 MG chewable tablet Commonly known as: TUMS - dosed in mg elemental calcium   cyclobenzaprine 10 MG tablet Commonly known as: FLEXERIL   famotidine 20 MG tablet Commonly known as: PEPCID   glucose blood test strip Commonly known as: Accu-Chek Guide   metroNIDAZOLE 500 MG tablet Commonly known as: FLAGYL   miconazole 2 % vaginal cream Commonly known as: MONISTAT 7   ondansetron 4 MG disintegrating tablet Commonly known as: ZOFRAN-ODT   terconazole 0.8 % vaginal cream Commonly known as: TERAZOL 3     TAKE these medications   FLUoxetine 40 MG capsule Commonly known as: PROZAC Take 40 mg by mouth daily.   hydrOXYzine 25 MG  capsule Commonly known as: VISTARIL Take 25 mg by mouth every 4 (four) hours as needed for anxiety.   ibuprofen 600 MG tablet Commonly known as: ADVIL Take 1 tablet (600 mg total) by mouth every 6 (six) hours as needed.   oxyCODONE-acetaminophen 5-325 MG tablet Commonly known as: Percocet Take 1 tablet by mouth every 4 (four) hours as needed for severe pain.   prenatal multivitamin Tabs tablet Take 1 tablet by mouth daily at 12 noon.       Diet: routine diet  Activity: Advance as tolerated. Pelvic rest for 6 weeks.   Outpatient follow up:BP check in 1 wk; PP visit in 4 wks Follow up Appt: Future Appointments  Date Time Provider [place]  08/29/2018 10:00 AM Anyanwu, Sallyanne Havers, MD [facility]   Follow up Visit:No follow-ups on file.  Postpartum contraception: Tubal Ligation- inpt  Newborn Data: Live born female  Birth Weight: 7 lb 6 oz (3345 g) APGAR: 7, 9  Newborn Delivery   Birth date/time: 07/26/2018 01:20:00 Delivery type: Vaginal, Spontaneous      Baby Feeding: Breast Disposition:home with mother   07/27/2018 Alexa White, CNM  11:19 AM

## 2018-07-27 NOTE — Discharge Instructions (Signed)
Laparoscopic Tubal Ligation, Care After °Refer to this sheet in the next few weeks. These instructions provide you with information about caring for yourself after your procedure. Your health care provider may also give you more specific instructions. Your treatment has been planned according to current medical practices, but problems sometimes occur. Call your health care provider if you have any problems or questions after your procedure. °What can I expect after the procedure? °After the procedure, it is common to have: °· A sore throat. °· Discomfort in your shoulder. °· Mild discomfort or cramping in your abdomen. °· Gas pains. °· Pain or soreness in the area where the surgical cut (incision) was made. °· A bloated feeling. °· Tiredness. °· Nausea. °· Vomiting. °Follow these instructions at home: °Medicines °· Take over-the-counter and prescription medicines only as told by your health care provider. °· Do not take aspirin because it can cause bleeding. °· Do not drive or operate heavy machinery while taking prescription pain medicine. °Activity °· Rest for the rest of the day. °· Return to your normal activities as told by your health care provider. Ask your health care provider what activities are safe for you. °Incision care ° °  ° °· Follow instructions from your health care provider about how to take care of your incision. Make sure you: °? Wash your hands with soap and water before you change your bandage (dressing). If soap and water are not available, use hand sanitizer. °? Change your dressing as told by your health care provider. °? Leave stitches (sutures) in place. They may need to stay in place for 2 weeks or longer. °· Check your incision area every day for signs of infection. Check for: °? More redness, swelling, or pain. °? More fluid or blood. °? Warmth. °? Pus or a bad smell. °Other Instructions °· Do not take baths, swim, or use a hot tub until your health care provider approves. You may take  showers. °· Keep all follow-up visits as told by your health care provider. This is important. °· Have someone help you with your daily household tasks for the first few days. °Contact a health care provider if: °· You have more redness, swelling, or pain around your incision. °· Your incision feels warm to the touch. °· You have pus or a bad smell coming from your incision. °· The edges of your incision break open after the sutures have been removed. °· Your pain does not improve after 2-3 days. °· You have a rash. °· You repeatedly become dizzy or light-headed. °· Your pain medicine is not helping. °· You are constipated. °Get help right away if: °· You have a fever. °· You faint. °· You have increasing pain in your abdomen. °· You have severe pain in one or both of your shoulders. °· You have fluid or blood coming from your sutures or from your vagina. °· You have shortness of breath or difficulty breathing. °· You have chest pain or leg pain. °· You have ongoing nausea, vomiting, or diarrhea. °This information is not intended to replace advice given to you by your health care provider. Make sure you discuss any questions you have with your health care provider. °Document Released: 08/19/2004 Document Revised: 09/26/2016 Document Reviewed: 01/10/2015 °Elsevier Interactive Patient Education © 2019 [employer] °Vaginal Delivery, Care After °Refer to this sheet in the next few weeks. These instructions provide you with information about caring for yourself after vaginal delivery. Your health care provider may also give   you more specific instructions. Your treatment has been planned according to current medical practices, but problems sometimes occur. Call your health care provider if you have any problems or questions. °What can I expect after the procedure? °After vaginal delivery, it is common to have: °· Some bleeding from your vagina. °· Soreness in your abdomen, your vagina, and the area of skin between your  vaginal opening and your anus (perineum). °· Pelvic cramps. °· Fatigue. °Follow these instructions at home: °Medicines °· Take over-the-counter and prescription medicines only as told by your health care provider. °· If you were prescribed an antibiotic medicine, take it as told by your health care provider. Do not stop taking the antibiotic until it is finished. °Driving ° °· Do not drive or operate heavy machinery while taking prescription pain medicine. °· Do not drive for 24 hours if you received a sedative. °Lifestyle °· Do not drink alcohol. This is especially important if you are breastfeeding or taking medicine to relieve pain. °· Do not use tobacco products, including cigarettes, chewing tobacco, or e-cigarettes. If you need help quitting, ask your health care provider. °Eating and drinking °· Drink at least 8 eight-ounce glasses of water every day unless you are told not to by your health care provider. If you choose to breastfeed your baby, you may need to drink more water than this. °· Eat high-fiber foods every day. These foods may help prevent or relieve constipation. High-fiber foods include: °? Whole grain cereals and breads. °? Brown rice. °? Beans. °? Fresh fruits and vegetables. °Activity °· Return to your normal activities as told by your health care provider. Ask your health care provider what activities are safe for you. °· Rest as much as possible. Try to rest or take a nap when your baby is sleeping. °· Do not lift anything that is heavier than your baby or 10 lb (4.5 kg) until your health care provider says that it is safe. °· Talk with your health care provider about when you can engage in sexual activity. This may depend on your: °? Risk of infection. °? Rate of healing. °? Comfort and desire to engage in sexual activity. °Vaginal Care °· If you have an episiotomy or a vaginal tear, check the area every day for signs of infection. Check for: °? More redness, swelling, or pain. °? More  fluid or blood. °? Warmth. °? Pus or a bad smell. °· Do not use tampons or douches until your health care provider says this is safe. °· Watch for any blood clots that may pass from your vagina. These may look like clumps of dark red, brown, or black discharge. °General instructions °· Keep your perineum clean and dry as told by your health care provider. °· Wear loose, comfortable clothing. °· Wipe from front to back when you use the toilet. °· Ask your health care provider if you can shower or take a bath. If you had an episiotomy or a perineal tear during labor and delivery, your health care provider may tell you not to take baths for a certain length of time. °· Wear a bra that supports your breasts and fits you well. °· If possible, have someone help you with household activities and help care for your baby for at least a few days after you leave the hospital. °· Keep all follow-up visits for you and your baby as told by your health care provider. This is important. °Contact a health care provider if: °· You have: °?   Vaginal discharge that has a bad smell. °? Difficulty urinating. °? Pain when urinating. °? A sudden increase or decrease in the frequency of your bowel movements. °? More redness, swelling, or pain around your episiotomy or vaginal tear. °? More fluid or blood coming from your episiotomy or vaginal tear. °? Pus or a bad smell coming from your episiotomy or vaginal tear. °? A fever. °? A rash. °? Little or no interest in activities you used to enjoy. °? Questions about caring for yourself or your baby. °· Your episiotomy or vaginal tear feels warm to the touch. °· Your episiotomy or vaginal tear is separating or does not appear to be healing. °· Your breasts are painful, hard, or turn red. °· You feel unusually sad or worried. °· You feel nauseous or you vomit. °· You pass large blood clots from your vagina. If you pass a blood clot from your vagina, save it to show to your health care provider. Do  not flush blood clots down the toilet without having your health care provider look at them. °· You urinate more than usual. °· You are dizzy or light-headed. °· You have not breastfed at all and you have not had a menstrual period for 12 weeks after delivery. °· You have stopped breastfeeding and you have not had a menstrual period for 12 weeks after you stopped breastfeeding. °Get help right away if: °· You have: °? Pain that does not go away or does not get better with medicine. °? Chest pain. °? Difficulty breathing. °? Blurred vision or spots in your vision. °? Thoughts about hurting yourself or your baby. °· You develop pain in your abdomen or in one of your legs. °· You develop a severe headache. °· You faint. °· You bleed from your vagina so much that you fill two sanitary pads in one hour. °This information is not intended to replace advice given to you by your health care provider. Make sure you discuss any questions you have with your health care provider. °Document Released: 01/28/2000 Document Revised: 07/14/2015 Document Reviewed: 02/14/2015 °Elsevier Interactive Patient Education © 2019 Else[employer] Inc. ° °

## 2018-07-27 NOTE — Lactation Note (Signed)
This note was copied from a baby's chart. Lactation Consultation Note  Patient Name: Alexa White PJASN'K Date: 07/27/2018 Reason for consult: Follow-up assessment   P3, Baby 12 hours old.  Baby has been breastfeeding and formula feeding. Attempted latching after formula feeding but baby would not suck. Mother's nipples evert and compressible with good flow of colostrum.  Encouraged bf before offering formula.  Reviewed hand positioning and chin tug. Suggest mother pump 10 min per side if baby is not latching. Discussed cluster feeding, depth and waking techniques. Feed on demand approximately 8-12 times per day.   Reviewed engorgement care and monitoring voids/stools.    Maternal Data Has patient been taught Hand Expression?: Yes  Feeding    LATCH Score Latch: Grasps breast easily, tongue down, lips flanged, rhythmical sucking.  Audible Swallowing: A few with stimulation  Type of Nipple: Everted at rest and after stimulation  Comfort (Breast/Nipple): Soft / non-tender  Hold (Positioning): Assistance needed to correctly position infant at breast and maintain latch.  LATCH Score: 8  Interventions Interventions: Breast feeding basics reviewed;Assisted with latch;Hand express;Pre-pump if needed;Hand pump  Lactation Tools Discussed/Used     Consult Status Consult Status: Complete Date: 07/27/18    Vivianne [facility] 07/27/2018, 12:24 PM

## 2018-07-29 ENCOUNTER — Encounter (HOSPITAL_COMMUNITY): Payer: Self-pay | Admitting: Obstetrics & Gynecology

## 2018-07-29 NOTE — Anesthesia Postprocedure Evaluation (Signed)
Anesthesia Post Note  Patient: Alexa White  Procedure(s) Performed: POST PARTUM TUBAL LIGATION (N/A )  Patient location during evaluation: [facility] Anesthesia Type: Spinal Level of consciousness: awake, awake and alert and oriented Pain management: pain level controlled Vital Signs Assessment: post-procedure vital signs reviewed and stable Respiratory status: spontaneous breathing Cardiovascular status: stable Postop Assessment: no headache, no backache, able to ambulate, no apparent nausea or vomiting and adequate PO intake Anesthetic complications: no     Last Vitals:  Vitals:   07/27/18 0526 07/27/18 1020  BP: 130/90 126/83  Pulse: 81 81  Resp: 18 16  Temp: 36.9 C 36.8 C  SpO2: 99% 100%    Last Pain:  Vitals:   07/27/18 1020  TempSrc: Oral  PainSc: 0-No pain                 Mica Ramdass

## 2018-07-29 NOTE — Transfer of Care (Signed)
Immediate Anesthesia Transfer of Care Note  Patient: Alexa White  Procedure(s) Performed: POST PARTUM TUBAL LIGATION (N/A )  Patient Location: PACU  Anesthesia Type:Spinal  Level of Consciousness: awake, alert  and oriented  Airway & Oxygen Therapy: Patient Spontanous Breathing  Post-op Assessment: Report given to RN and Post -op Vital signs reviewed and stable  Post vital signs: Reviewed and stable  Last Vitals:  Vitals Value Taken Time  BP 126/83 07/27/18 1020  Temp 36.8 C 07/27/18 1020  Pulse 81 07/27/18 1020  Resp 16 07/27/18 1020  SpO2 100 % 07/27/18 1020    Last Pain:  Vitals:   07/27/18 1020  TempSrc: Oral  PainSc: 0-No pain         Complications: No apparent anesthesia complications

## 2018-07-30 ENCOUNTER — Ambulatory Visit (HOSPITAL_COMMUNITY): Payer: Medicaid Other

## 2018-07-30 ENCOUNTER — Encounter (HOSPITAL_COMMUNITY): Payer: Self-pay

## 2018-07-30 ENCOUNTER — Encounter: Payer: Medicaid Other | Admitting: Family Medicine

## 2018-08-05 ENCOUNTER — Telehealth: Payer: Medicaid Other

## 2018-08-05 ENCOUNTER — Telehealth: Payer: Self-pay | Admitting: Radiology

## 2018-08-05 NOTE — Telephone Encounter (Signed)
Let message for patient to call [facility], called for virtual mychart visit with RN

## 2018-08-06 ENCOUNTER — Ambulatory Visit (HOSPITAL_COMMUNITY): Payer: Medicaid Other

## 2018-08-27 ENCOUNTER — Telehealth: Payer: Self-pay | Admitting: Radiology

## 2018-08-27 NOTE — Telephone Encounter (Signed)
Left message that visit on 08/29/18 @ 10:00 will be virtual with Dr Cay Schillings

## 2018-08-29 ENCOUNTER — Other Ambulatory Visit: Payer: Self-pay

## 2018-08-29 ENCOUNTER — Encounter: Payer: Self-pay | Admitting: Obstetrics & Gynecology

## 2018-08-29 ENCOUNTER — Telehealth: Payer: Medicaid Other | Admitting: Obstetrics & Gynecology

## 2018-08-29 NOTE — Progress Notes (Signed)
   Patient was not available today for her scheduled virtual appointment.   Jeannine Pennisi, MD, FACOG Obstetrician & Gynecologist, [facility], [facility]  

## 2018-10-24 ENCOUNTER — Encounter: Payer: Self-pay | Admitting: Radiology

## 2019-05-16 IMAGING — US US MFM OB COMP +14 WKS
1 series · 13 of 28 positions shown · non-contrast
Comparison: none

[Series 1: us mfm ob comp +14 wks · 13 of 61 slices shown]
[im 3/61]
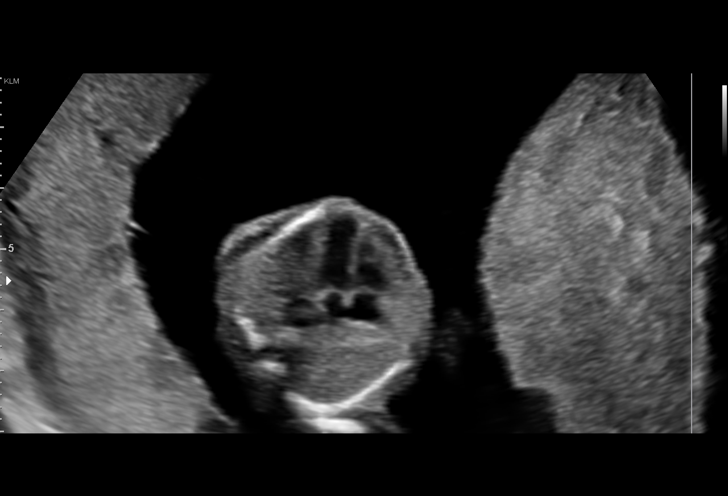
[im 7/61]
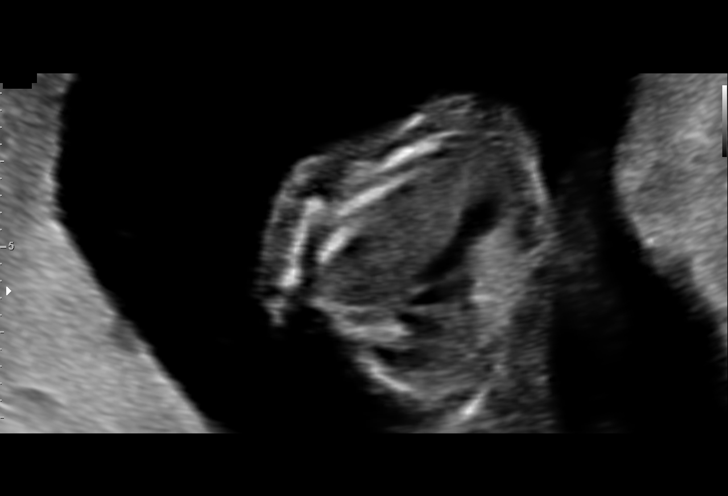
[im 12/61]
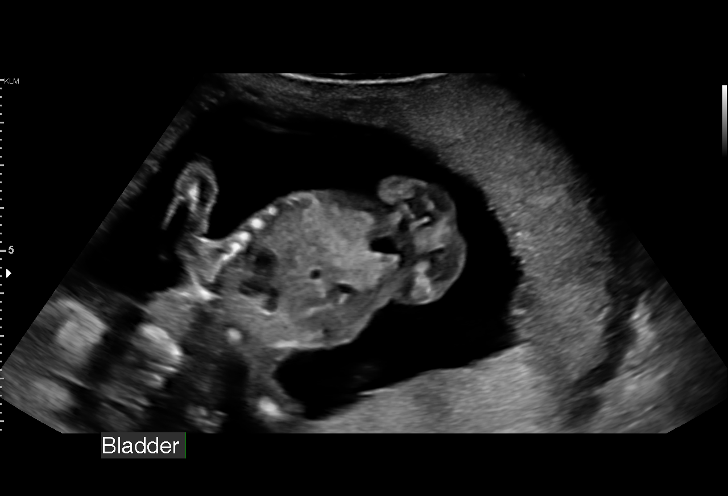
[im 16/61]
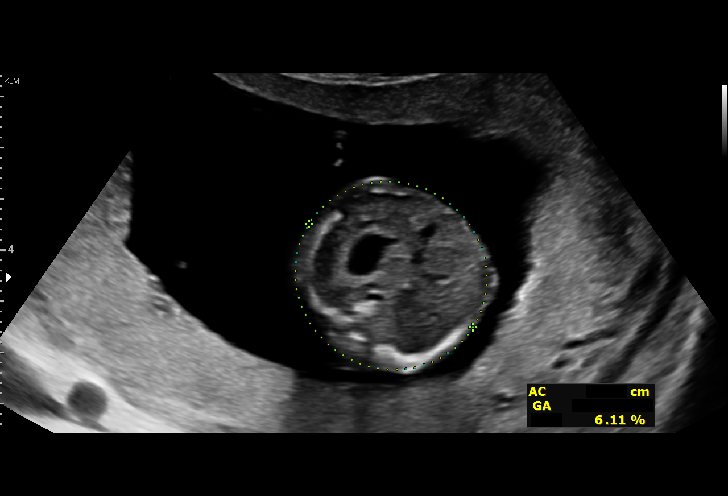
[im 21/61]
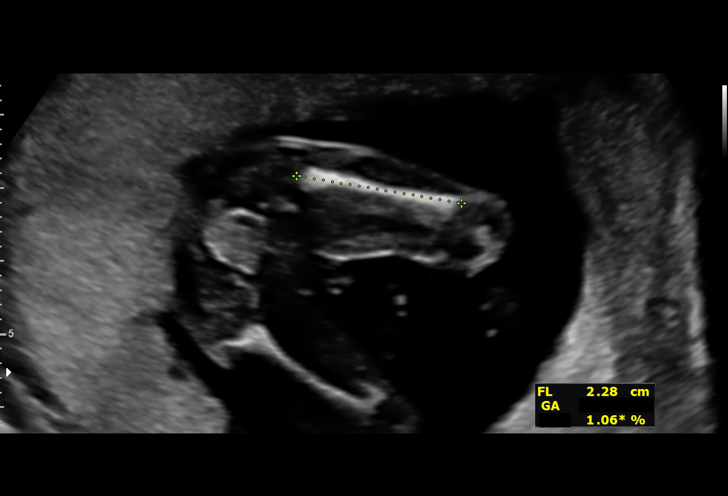
[im 25/61]
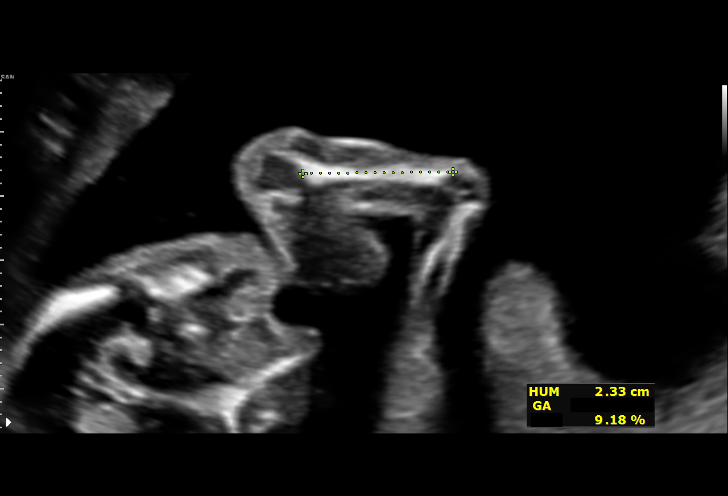
[im 32/61]
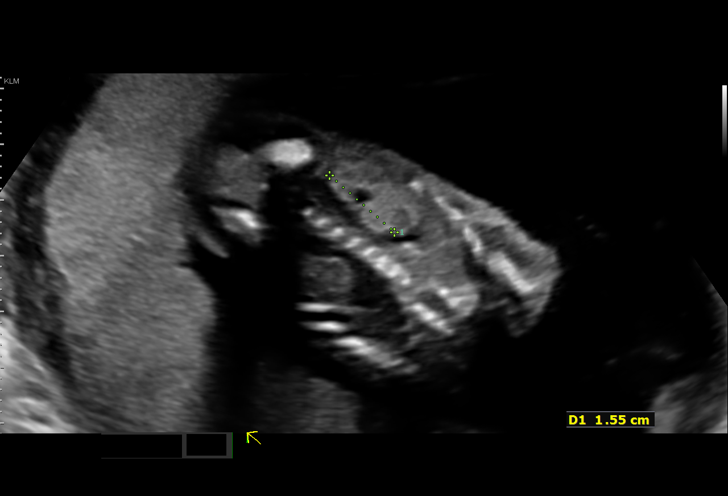
[im 36/61]
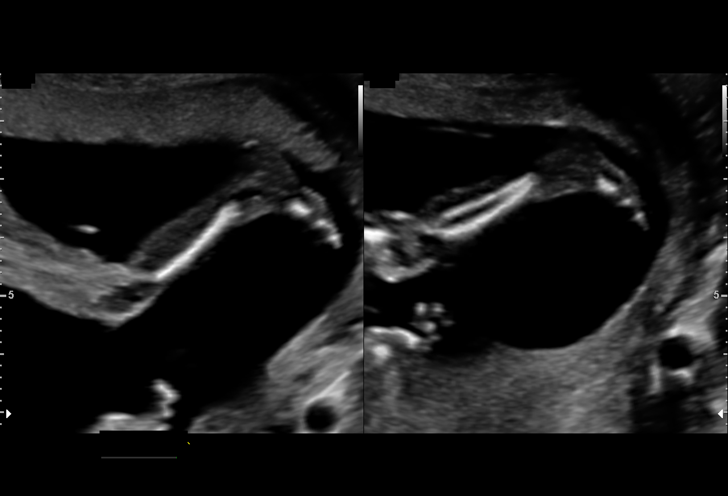
[im 41/61]
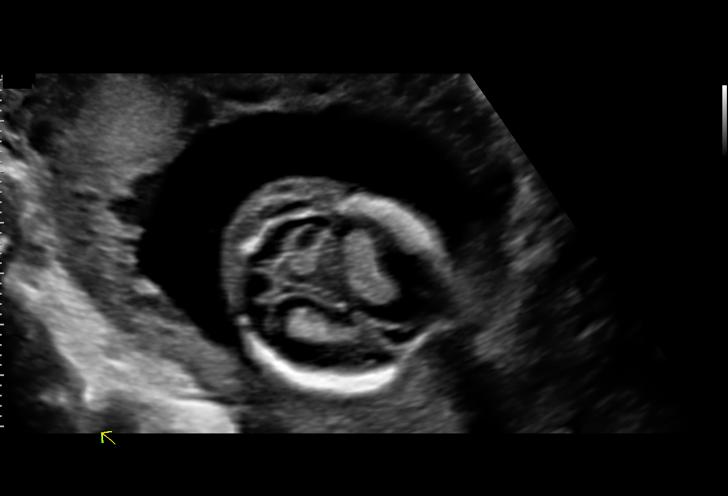
[im 45/61]
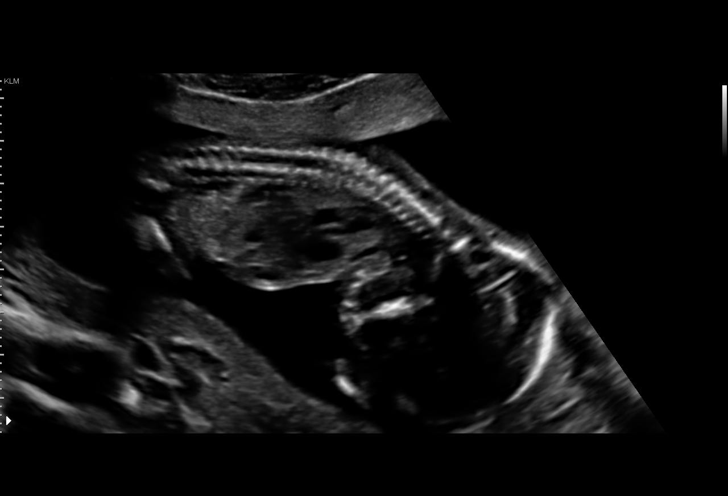
[im 49/61]
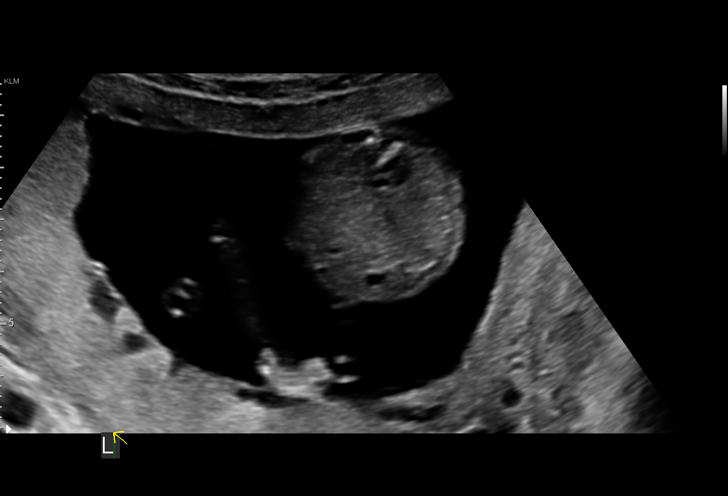
[im 54/61]
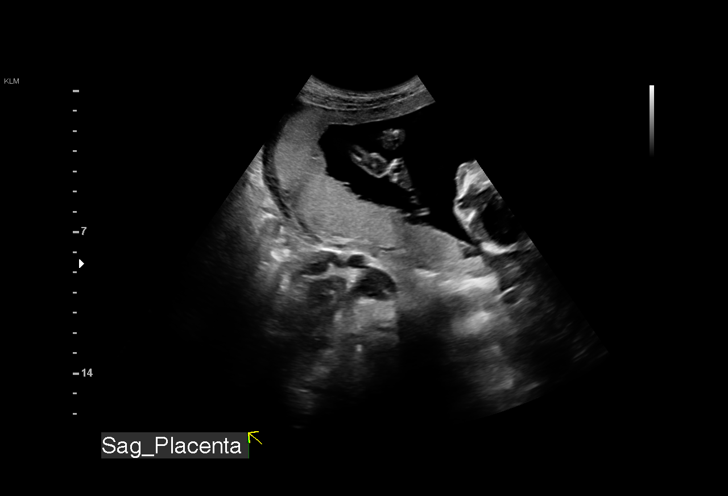
[im 58/61]
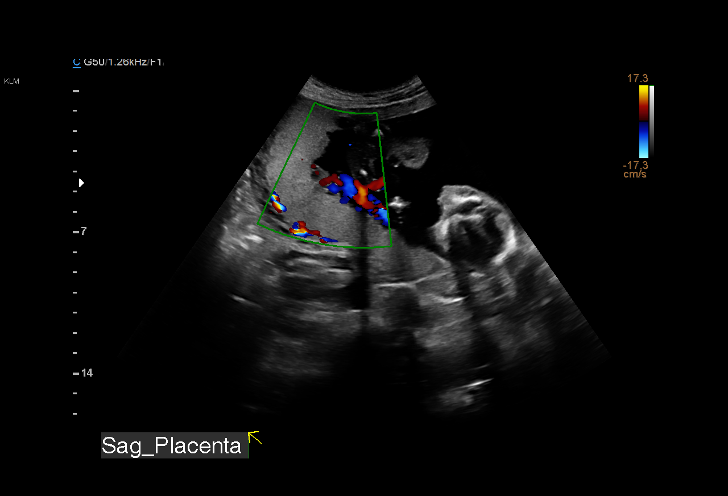

[13 of 28 positions shown; findings below may reference images not displayed]

Attending:        Purna Pettus        Address:          [REDACTED]
                   [address]

                                                       [place]
 ----------------------------------------------------------------------

 ----------------------------------------------------------------------
Indications

  Encounter for antenatal screening for
  malformations
  17 weeks gestation of pregnancy
  Encounter for uncertain dates
  Marijuana Abuse
 ----------------------------------------------------------------------
Fetal Evaluation

 Num Of Fetuses:         1
 Fetal Heart Rate(bpm):  135
 Cardiac Activity:       Observed
 Presentation:           Cephalic
 Placenta:               Posterior
 P. Cord Insertion:      Visualized

 Amniotic Fluid
 AFI FV:      Within normal limits
Biometry

 BPD:      40.1  mm     G. Age:  18w 1d         76  %    CI:        76.55   %    70 - 86
                                                         FL/HC:      15.1   %    15.8 - 18
 HC:      145.2  mm     G. Age:  17w 5d         49  %    HC/AC:      1.21        1.07 -
 AC:      119.7  mm     G. Age:  17w 5d         52  %    FL/BPD:     54.6   %
 FL:       21.9  mm     G. Age:  16w 4d         13  %    FL/AC:      18.3   %    20 - 24
 HUM:      23.8  mm     G. Age:  17w 2d         51  %
 CER:      16.6  mm     G. Age:  16w 4d         28  %

 Est. FW:     186  gm      0 lb 7 oz     44  %
Gestational Age

 LMP:           19w 0d        Date:  10/25/17                 EDD:   08/01/18
 U/S Today:     17w 4d                                        EDD:   08/11/18
 Best:          17w 4d     Det. By:  U/S (03/07/18)           EDD:   08/11/18
Anatomy

 Cranium:               Appears normal         Aortic Arch:            Not well visualized
 Cavum:                 Not well visualized    Ductal Arch:            Not well visualized
 Ventricles:            Appears normal         Diaphragm:              Appears normal
 Choroid Plexus:        Appears normal         Stomach:                Appears normal, left
                                                                       sided
 Cerebellum:            Appears normal         Abdomen:                Appears normal
 Posterior Fossa:       Appears normal         Abdominal Wall:         Appears nml (cord
                                                                       insert, abd wall)
 Nuchal Fold:           Not well visualized    Cord Vessels:           Appears normal (3
                                                                       vessel cord)
 Face:                  Appears normal         Kidneys:                Appear normal
                        (orbits and profile)
 Lips:                  Not well visualized    Bladder:                Appears normal
 Thoracic:              Appears normal         Spine:                  Not well visualized
 Heart:                 Appears normal         Upper Extremities:      Appears normal
                        (4CH, axis, and situs
 RVOT:                  Appears normal         Lower Extremities:      Appears normal
 LVOT:                  Appears normal

 Other:  Fetus appears to be a male. Heels visualized.
Cervix Uterus Adnexa

 Cervix
 Length:            3.6  cm.
 Normal appearance by transabdominal scan.
Impression

 We performed fetal anatomy scan. No makers of
 aneuploidies or fetal structural defects are seen. Fetal
 biometry is consistent with 17w 4d gestation.  Amniotic fluid is
 normal and good fetal activity is seen.
 Patient is unsure of her LMP dates. We amended her EDD to
 08/11/2018. She has not had screening for fetal aneuploidies.
Recommendations

 -An appointment was made for her to return in 4 weeks for
 completion of fetal anatomy.
                 [place]

## 2019-06-13 NOTE — Telephone Encounter (Signed)
error 

## 2019-07-15 DIAGNOSIS — F4310 Post-traumatic stress disorder, unspecified: Secondary | ICD-10-CM | POA: Diagnosis not present

## 2019-07-15 DIAGNOSIS — F332 Major depressive disorder, recurrent severe without psychotic features: Secondary | ICD-10-CM | POA: Diagnosis not present

## 2019-07-15 DIAGNOSIS — F410 Panic disorder [episodic paroxysmal anxiety] without agoraphobia: Secondary | ICD-10-CM | POA: Diagnosis not present

## 2019-07-31 DIAGNOSIS — F410 Panic disorder [episodic paroxysmal anxiety] without agoraphobia: Secondary | ICD-10-CM | POA: Diagnosis not present

## 2019-07-31 DIAGNOSIS — F4310 Post-traumatic stress disorder, unspecified: Secondary | ICD-10-CM | POA: Diagnosis not present

## 2019-07-31 DIAGNOSIS — F332 Major depressive disorder, recurrent severe without psychotic features: Secondary | ICD-10-CM | POA: Diagnosis not present

## 2019-08-19 DIAGNOSIS — F332 Major depressive disorder, recurrent severe without psychotic features: Secondary | ICD-10-CM | POA: Diagnosis not present

## 2019-08-19 DIAGNOSIS — F4310 Post-traumatic stress disorder, unspecified: Secondary | ICD-10-CM | POA: Diagnosis not present

## 2019-08-19 DIAGNOSIS — F410 Panic disorder [episodic paroxysmal anxiety] without agoraphobia: Secondary | ICD-10-CM | POA: Diagnosis not present

## 2019-10-07 DIAGNOSIS — F4310 Post-traumatic stress disorder, unspecified: Secondary | ICD-10-CM | POA: Diagnosis not present

## 2019-10-07 DIAGNOSIS — F410 Panic disorder [episodic paroxysmal anxiety] without agoraphobia: Secondary | ICD-10-CM | POA: Diagnosis not present

## 2019-10-07 DIAGNOSIS — F332 Major depressive disorder, recurrent severe without psychotic features: Secondary | ICD-10-CM | POA: Diagnosis not present

## 2019-12-18 DIAGNOSIS — F332 Major depressive disorder, recurrent severe without psychotic features: Secondary | ICD-10-CM | POA: Diagnosis not present

## 2020-04-12 DIAGNOSIS — F410 Panic disorder [episodic paroxysmal anxiety] without agoraphobia: Secondary | ICD-10-CM | POA: Diagnosis not present

## 2020-04-12 DIAGNOSIS — F4310 Post-traumatic stress disorder, unspecified: Secondary | ICD-10-CM | POA: Diagnosis not present

## 2020-04-12 DIAGNOSIS — F332 Major depressive disorder, recurrent severe without psychotic features: Secondary | ICD-10-CM | POA: Diagnosis not present

## 2020-08-09 DIAGNOSIS — F4310 Post-traumatic stress disorder, unspecified: Secondary | ICD-10-CM | POA: Diagnosis not present

## 2020-08-09 DIAGNOSIS — F332 Major depressive disorder, recurrent severe without psychotic features: Secondary | ICD-10-CM | POA: Diagnosis not present

## 2020-08-09 DIAGNOSIS — F410 Panic disorder [episodic paroxysmal anxiety] without agoraphobia: Secondary | ICD-10-CM | POA: Diagnosis not present

## 2020-10-04 DIAGNOSIS — Z20822 Contact with and (suspected) exposure to covid-19: Secondary | ICD-10-CM | POA: Diagnosis not present

## 2020-11-09 DIAGNOSIS — F410 Panic disorder [episodic paroxysmal anxiety] without agoraphobia: Secondary | ICD-10-CM | POA: Diagnosis not present

## 2020-11-09 DIAGNOSIS — F332 Major depressive disorder, recurrent severe without psychotic features: Secondary | ICD-10-CM | POA: Diagnosis not present

## 2020-11-09 DIAGNOSIS — F4310 Post-traumatic stress disorder, unspecified: Secondary | ICD-10-CM | POA: Diagnosis not present

## 2021-01-26 DIAGNOSIS — F332 Major depressive disorder, recurrent severe without psychotic features: Secondary | ICD-10-CM | POA: Diagnosis not present

## 2021-09-12 ENCOUNTER — Emergency Department (HOSPITAL_COMMUNITY)
Admission: EM | Admit: 2021-09-12 | Discharge: 2021-09-13 | Disposition: A | Discharge status: Home / Self Care | Payer: BC Managed Care – PPO | Attending: Emergency Medicine | Admitting: Emergency Medicine

## 2021-09-12 ENCOUNTER — Emergency Department (HOSPITAL_COMMUNITY): Payer: BC Managed Care – PPO

## 2021-09-12 DIAGNOSIS — K760 Fatty (change of) liver, not elsewhere classified: Secondary | ICD-10-CM | POA: Diagnosis not present

## 2021-09-12 DIAGNOSIS — K2970 Gastritis, unspecified, without bleeding: Secondary | ICD-10-CM | POA: Diagnosis not present

## 2021-09-12 DIAGNOSIS — R200 Anesthesia of skin: Secondary | ICD-10-CM | POA: Diagnosis not present

## 2021-09-12 DIAGNOSIS — R109 Unspecified abdominal pain: Secondary | ICD-10-CM

## 2021-09-12 DIAGNOSIS — M62838 Other muscle spasm: Secondary | ICD-10-CM | POA: Insufficient documentation

## 2021-09-12 DIAGNOSIS — R748 Abnormal levels of other serum enzymes: Secondary | ICD-10-CM | POA: Insufficient documentation

## 2021-09-12 DIAGNOSIS — N281 Cyst of kidney, acquired: Secondary | ICD-10-CM | POA: Diagnosis not present

## 2021-09-12 DIAGNOSIS — K5790 Diverticulosis of intestine, part unspecified, without perforation or abscess without bleeding: Secondary | ICD-10-CM | POA: Diagnosis not present

## 2021-09-12 DIAGNOSIS — M419 Scoliosis, unspecified: Secondary | ICD-10-CM | POA: Diagnosis not present

## 2021-09-12 DIAGNOSIS — R079 Chest pain, unspecified: Secondary | ICD-10-CM | POA: Diagnosis not present

## 2021-09-12 DIAGNOSIS — R1011 Right upper quadrant pain: Secondary | ICD-10-CM | POA: Diagnosis not present

## 2021-09-12 DIAGNOSIS — R519 Headache, unspecified: Secondary | ICD-10-CM | POA: Insufficient documentation

## 2021-09-12 DIAGNOSIS — M62830 Muscle spasm of back: Secondary | ICD-10-CM | POA: Diagnosis not present

## 2021-09-12 DIAGNOSIS — K2960 Other gastritis without bleeding: Secondary | ICD-10-CM

## 2021-09-12 LAB — CBC WITH DIFFERENTIAL/PLATELET
Abs Immature Granulocytes: 0.04 10*3/uL (ref 0.00–0.07)
Basophils Absolute: 0.1 10*3/uL (ref 0.0–0.1)
Basophils Relative: 1 %
Eosinophils Absolute: 0.2 10*3/uL (ref 0.0–0.5)
Eosinophils Relative: 2 %
HCT: 37.3 % (ref 36.0–46.0)
Hemoglobin: 12.9 g/dL (ref 12.0–15.0)
Immature Granulocytes: 0 %
Lymphocytes Relative: 37 %
Lymphs Abs: 3.8 10*3/uL (ref 0.7–4.0)
MCH: 33.7 pg (ref 26.0–34.0)
MCHC: 34.6 g/dL (ref 30.0–36.0)
MCV: 97.4 fL (ref 80.0–100.0)
Monocytes Absolute: 0.8 10*3/uL (ref 0.1–1.0)
Monocytes Relative: 8 %
Neutro Abs: 5.5 10*3/uL (ref 1.7–7.7)
Neutrophils Relative %: 52 %
Platelets: 283 10*3/uL (ref 150–400)
RBC: 3.83 MIL/uL — ABNORMAL LOW (ref 3.87–5.11)
RDW: 11.2 % — ABNORMAL LOW (ref 11.5–15.5)
WBC: 10.3 10*3/uL (ref 4.0–10.5)
nRBC: 0 % (ref 0.0–0.2)

## 2021-09-12 LAB — URINALYSIS, ROUTINE W REFLEX MICROSCOPIC
Bacteria, UA: NONE SEEN
Bilirubin Urine: NEGATIVE
Glucose, UA: NEGATIVE mg/dL
Ketones, ur: NEGATIVE mg/dL
Leukocytes,Ua: NEGATIVE
Nitrite: NEGATIVE
Protein, ur: NEGATIVE mg/dL
Specific Gravity, Urine: 1.02 (ref 1.005–1.030)
pH: 5 (ref 5.0–8.0)

## 2021-09-12 LAB — COMPREHENSIVE METABOLIC PANEL
ALT: 11 U/L (ref 0–44)
AST: 15 U/L (ref 15–41)
Albumin: 4.1 g/dL (ref 3.5–5.0)
Alkaline Phosphatase: 45 U/L (ref 38–126)
Anion gap: 6 (ref 5–15)
BUN: 12 mg/dL (ref 6–20)
CO2: 24 mmol/L (ref 22–32)
Calcium: 9 mg/dL (ref 8.9–10.3)
Chloride: 106 mmol/L (ref 98–111)
Creatinine, Ser: 0.64 mg/dL (ref 0.44–1.00)
GFR, Estimated: >60 mL/min (ref >60)
Glucose, Bld: 103 mg/dL — ABNORMAL HIGH (ref 70–99)
Potassium: 3.7 mmol/L (ref 3.5–5.1)
Sodium: 136 mmol/L (ref 135–145)
Total Bilirubin: 0.9 mg/dL (ref 0.3–1.2)
Total Protein: 6.6 g/dL (ref 6.5–8.1)

## 2021-09-12 LAB — LIPASE, BLOOD: Lipase: 52 U/L — ABNORMAL HIGH (ref 11–51)

## 2021-09-12 LAB — I-STAT BETA HCG BLOOD, ED (MC, WL, AP ONLY): I-stat hCG, quantitative: <5 m[IU]/mL (ref <5)

## 2021-09-12 NOTE — ED Provider Triage Note (Signed)
  Emergency Medicine Provider Triage Evaluation Note  MRN:  [id number]  Arrival date & time: 09/12/21    Medically screening exam initiated at 11:04 PM.   CC:   Flank Pain (/)   HPI:  Alexa White is a 36 y.o. year-old female presents to the ED with chief complaint of right side pain x 1 week.  Worse with palpation and after eating.  Denies fever.  History provided by patient. ROS:  -As included in HPI PE:   Vitals:   09/12/21 2300 09/12/21 2301  BP: 125/73   Pulse:  89  Resp: 16   Temp: 98.7 F (37.1 C)   SpO2:  100%    Non-toxic appearing No respiratory distress RUQ TTP MDM:   I've ordered labs and imaging in triage to expedite lab/diagnostic workup.  Patient was informed that the remainder of the evaluation will be completed by another provider, this initial triage assessment does not replace that evaluation, and the importance of remaining in the ED until their evaluation is complete.    Roxy Horseman, PA-C 09/12/21 2332

## 2021-09-12 NOTE — ED Triage Notes (Signed)
Pt c/o intermittent R flank pain x1wk, "sometimes worse w movement, lifting," progressively worse. Endorses bilateral arm numbness when laying flat, worse in R side.

## 2021-09-13 ENCOUNTER — Emergency Department (HOSPITAL_COMMUNITY): Payer: BC Managed Care – PPO

## 2021-09-13 DIAGNOSIS — M419 Scoliosis, unspecified: Secondary | ICD-10-CM | POA: Diagnosis not present

## 2021-09-13 DIAGNOSIS — R109 Unspecified abdominal pain: Secondary | ICD-10-CM | POA: Diagnosis not present

## 2021-09-13 DIAGNOSIS — K5790 Diverticulosis of intestine, part unspecified, without perforation or abscess without bleeding: Secondary | ICD-10-CM | POA: Diagnosis not present

## 2021-09-13 DIAGNOSIS — R079 Chest pain, unspecified: Secondary | ICD-10-CM | POA: Diagnosis not present

## 2021-09-13 DIAGNOSIS — N281 Cyst of kidney, acquired: Secondary | ICD-10-CM | POA: Diagnosis not present

## 2021-09-13 DIAGNOSIS — K2970 Gastritis, unspecified, without bleeding: Secondary | ICD-10-CM | POA: Diagnosis not present

## 2021-09-13 MED ORDER — MORPHINE SULFATE (PF) 4 MG/ML IV SOLN
4.0000 mg | Freq: Once | INTRAVENOUS | Status: AC
  Administered 2021-09-13: 4 mg via INTRAVENOUS
  Filled 2021-09-13: qty 1

## 2021-09-13 MED ORDER — METHOCARBAMOL 500 MG PO TABS: 500.0000 mg | ORAL_TABLET | Freq: Two times a day (BID) | ORAL | 0 refills | Status: AC

## 2021-09-13 MED ORDER — ONDANSETRON HCL 4 MG/2ML IJ SOLN
4.0000 mg | Freq: Once | INTRAMUSCULAR | Status: AC
  Administered 2021-09-13: 4 mg via INTRAVENOUS
  Filled 2021-09-13: qty 2

## 2021-09-13 MED ORDER — IOHEXOL 350 MG/ML SOLN
100.0000 mL | Freq: Once | INTRAVENOUS | Status: AC | PRN
  Administered 2021-09-13: 100 mL via INTRAVENOUS

## 2021-09-13 MED ORDER — OMEPRAZOLE 20 MG PO CPDR: 20.0000 mg | DELAYED_RELEASE_CAPSULE | Freq: Every day | ORAL | 0 refills | Status: DC

## 2021-09-13 MED ORDER — IOHEXOL 300 MG/ML  SOLN
100.0000 mL | Freq: Once | INTRAMUSCULAR | Status: AC | PRN
  Administered 2021-09-13: 100 mL via INTRAVENOUS

## 2021-09-13 NOTE — Discharge Instructions (Addendum)
Your laboratory results along with CT scan were within normal limits.  I have prescribed a short course of muscle relaxers to help with your symptoms, please take 1 tablet twice a day for the next 7 days.  Please be aware this medication can make you drowsy, do not drink alcohol or drive while taking this medication.  In addition, you were prescribed medication to help with acid in your abdomen.  Please take 1 tablet daily for the next 14 days.  You will need to schedule an appointment with a primary care physician for further evaluation of your symptoms.

## 2021-09-13 NOTE — ED Notes (Signed)
CT called and notified that pt has IV access for scan.

## 2021-09-13 NOTE — ED Provider Notes (Cosign Needed Addendum)
[facility] Provider Note   CSN: [id number] Arrival date & time: 09/12/21  2255   History  Chief Complaint  Patient presents with   Flank Pain         Alexa White is a 36 y.o. female.  36 y.o female with no PMH presents to the ED with a chief complaint of right flank pain x 1 week.  Patient endorses a constant sensation of the right flank, feeling of fullness exacerbated with lying flat.  This pain is also exacerbated with any type of ambulation.  She was given morphine when she arrived to the ED.  She reports the pain has been ongoing for a week, however new symptoms began 2 days ago.  She is endorsing some pain along back which she describes with a tingling sensation to bilateral arms.  This is exacerbated anytime she lies flat.  She thought that this was likely as she was lying on her back on on the side however this has continued.  She reports the pain is not worse when she picks up her children and the tingling is not exacerbated by lifting of her kids.  She tried discontinuing caffeine, energy drinks but there has not been improvement in symptoms.  So endorsing a headache, states this very vague complaint with some changes in her vision as she feels this is more "tunnel ".  Denies any chest pain, shortness of breath, prior history of blood clots.  The history is provided by the patient.  Flank Pain This is a new problem. The current episode started more than 1 week ago. The problem occurs constantly. The problem has been gradually worsening. Pertinent negatives include no chest pain, no abdominal pain, no headaches and no shortness of breath.       Home Medications Prior to Admission medications   Medication Sig Start Date End Date Taking? Authorizing Provider  methocarbamol (ROBAXIN) 500 MG tablet Take 1 tablet (500 mg total) by mouth 2 (two) times daily for 7 days. 09/13/21 09/20/21 Yes Maleeya Peterkin, Leonie DouglasJohana, PA-C  omeprazole (PRILOSEC) 20 MG  capsule Take 1 capsule (20 mg total) by mouth daily for 14 days. 09/13/21 09/27/21 Yes Tylah Mancillas, PA-C  FLUoxetine (PROZAC) 40 MG capsule Take 40 mg by mouth daily.    [provider]  hydrOXYzine (VISTARIL) 25 MG capsule Take 25 mg by mouth every 4 (four) hours as needed for anxiety.    [provider]  ibuprofen (ADVIL) 600 MG tablet Take 1 tablet (600 mg total) by mouth every 6 (six) hours as needed. 07/27/18   Arabella MerlesShaw, Kimberly D, CNM  Prenatal Vit-Fe Fumarate-FA (PRENATAL MULTIVITAMIN) TABS tablet Take 1 tablet by mouth daily at 12 noon.    [provider]      Allergies    Patient has no known allergies.    Review of Systems   Review of Systems  Constitutional:  Negative for chills and fever.  HENT:  Negative for sore throat.   Respiratory:  Negative for shortness of breath.   Cardiovascular:  Negative for chest pain.  Gastrointestinal:  Negative for abdominal pain, nausea and vomiting.  Genitourinary:  Positive for flank pain.  Musculoskeletal:  Positive for back pain.  Neurological:  Negative for light-headedness and headaches.  All other systems reviewed and are negative.   Physical Exam Updated Vital Signs BP 110/76 (BP Location: Right Arm)   Pulse (!) 59   Temp 98.3 F (36.8 C) (Oral)   Resp 17  SpO2 100%  Physical Exam Vitals and nursing note reviewed.  Constitutional:      Appearance: Normal appearance. She is not ill-appearing.  HENT:     Head: Normocephalic and atraumatic.  Eyes:     Pupils: Pupils are equal, round, and reactive to light.  Cardiovascular:     Rate and Rhythm: Normal rate.     Pulses:          Radial pulses are 2+ on the right side and 2+ on the left side.  Pulmonary:     Effort: Pulmonary effort is normal.     Breath sounds: No wheezing.  Abdominal:     General: Abdomen is flat.     Palpations: Abdomen is soft.     Tenderness: There is no abdominal tenderness. There is right CVA tenderness.  Musculoskeletal:      Cervical back: Normal range of motion and neck supple.  Skin:    General: Skin is warm and dry.  Neurological:     Mental Status: She is alert and oriented to person, place, and time.     ED Results / Procedures / Treatments   Labs (all labs ordered are listed, but only abnormal results are displayed) Labs Reviewed  COMPREHENSIVE METABOLIC PANEL - Abnormal; Notable for the following components:      Result Value   Glucose, Bld 103 (*)    All other components within normal limits  LIPASE, BLOOD - Abnormal; Notable for the following components:   Lipase 52 (*)    All other components within normal limits  CBC WITH DIFFERENTIAL/PLATELET - Abnormal; Notable for the following components:   RBC 3.83 (*)    RDW 11.2 (*)    All other components within normal limits  URINALYSIS, ROUTINE W REFLEX MICROSCOPIC - Abnormal; Notable for the following components:   APPearance HAZY (*)    Hgb urine dipstick SMALL (*)    All other components within normal limits  I-STAT BETA HCG BLOOD, ED ([facility], [facility], [facility] ONLY)    EKG None  Radiology CT Angio Chest/Abd/Pel for Dissection W and/or Wo Contrast  Result Date: 09/13/2021 CLINICAL DATA:  Chest or back pain, aortic dissection suspected. EXAM: CT ANGIOGRAPHY CHEST, ABDOMEN AND PELVIS TECHNIQUE: Non-contrast CT of the chest was initially obtained. Multidetector CT imaging through the chest, abdomen and pelvis was performed using the standard protocol during bolus administration of intravenous contrast. Multiplanar reconstructed images and MIPs were obtained and reviewed to evaluate the vascular anatomy. RADIATION DOSE REDUCTION: This exam was performed according to the departmental dose-optimization program which includes automated exposure control, adjustment of the mA and/or kV according to patient size and/or use of iterative reconstruction technique. CONTRAST:  OMNIPAQUE IOHEXOL 350 MG/ML SOLN COMPARISON:  CT abdomen earlier same day FINDINGS: CTA  CHEST FINDINGS Cardiovascular: Heart size is normal. No pericardial fluid. No coronary artery calcification or aortic atherosclerotic calcification. Incidental small ductus diverticulum, not pathologic. No sign of dissection or other acute aortic pathology. Pulmonary arteries are normal. Mediastinum/Nodes: No mass or lymphadenopathy. Lungs/Pleura: The lungs are clear.  No pleural effusion. Musculoskeletal: Mild scoliotic curvature of the spine which could be positional. No evidence of fracture or focal lesion. No significant degenerative changes. No rib abnormality. Review of the MIP images confirms the above findings. CTA ABDOMEN AND PELVIS FINDINGS VASCULAR Aorta: Normal Celiac: Normal SMA: Normal Renals: Normal IMA: Normal Inflow: Normal Veins: No venous abnormality seen. Review of the MIP images confirms the above findings. NON-VASCULAR Hepatobiliary: Normal Pancreas: Normal Spleen:  Normal Adrenals/Urinary Tract: Adrenal glands are normal. Kidneys are normal. No stone disease or hydronephrosis. Bladder is normal. Stomach/Bowel: Normal Lymphatic: Normal Reproductive: Retroverted uterus.  No pathologic finding. Other: Tiny amount of fluid in the pelvic cul-de-sac. Musculoskeletal: Normal Review of the MIP images confirms the above findings. IMPRESSION: No vascular pathology demonstrated. No sign of dissection. No other cause of chest pain identified. Lungs are clear. No abdominal or pelvic abnormality identified. Electronically Signed   By: Paulina Fusi M.D.   On: 09/13/2021 10:03   CT ABDOMEN PELVIS W CONTRAST  Result Date: 09/13/2021 CLINICAL DATA:  36 year old female with history of acute onset of abdominal pain. EXAM: CT ABDOMEN AND PELVIS WITH CONTRAST TECHNIQUE: Multidetector CT imaging of the abdomen and pelvis was performed using the standard protocol following bolus administration of intravenous contrast. RADIATION DOSE REDUCTION: This exam was performed according to the departmental dose-optimization  program which includes automated exposure control, adjustment of the mA and/or kV according to patient size and/or use of iterative reconstruction technique. CONTRAST:  OMNIPAQUE IOHEXOL 300 MG/ML  SOLN COMPARISON:  No priors. FINDINGS: Lower chest: Unremarkable. Hepatobiliary: No suspicious cystic or solid hepatic lesions. No intra or extrahepatic biliary ductal dilatation. Gallbladder is normal in appearance. Pancreas: No pancreatic mass or peripancreatic fluid collections or inflammatory changes are noted on today's noncontrast CT examination. Spleen: Unremarkable. Adrenals/Urinary Tract: Subcentimeter low-attenuation lesion in the interpolar region of the left kidney, too small to characterize, but statistically likely tiny cysts (no imaging follow-up is recommended). Right kidney and bilateral adrenal glands are normal in appearance. No hydroureteronephrosis. Urinary bladder is normal in appearance. Stomach/Bowel: The appearance of the stomach is normal. No pathologic dilatation of small bowel or colon. Normal appendix. Vascular/Lymphatic: No significant atherosclerotic disease, aneurysm or dissection noted in the abdominal or pelvic vasculature. No lymphadenopathy noted in the abdomen or pelvis. Reproductive: Uterus and left ovary are unremarkable in appearance. Small centrally low-attenuation rim enhancing structure in the right adnexa measuring 1.5 cm, presumably a degenerating corpus luteum cyst. Right-sided tubal ligation clips are noted. No definite left-sided clips are identified. Other: Trace volume of fluid in the cul-de-sac, presumably physiologic in this young female patient. No larger volume of ascites. No pneumoperitoneum. Musculoskeletal: There are no aggressive appearing lytic or blastic lesions noted in the visualized portions of the skeleton. IMPRESSION: 1. Probable degenerating corpus luteum cyst in the right ovary with trace volume of free fluid in the low anatomic pelvis. 2. No other  acute findings are noted elsewhere in the abdomen or pelvis to account for the patient's symptoms. Electronically Signed   By: Trudie Reed M.D.   On: 09/13/2021 07:43   US Abdomen Limited  Result Date: 09/12/2021 CLINICAL DATA:  Right upper quadrant pain EXAM: ULTRASOUND ABDOMEN LIMITED RIGHT UPPER QUADRANT COMPARISON:  None Available. FINDINGS: Gallbladder: Gallbladder is partially decompressed. No wall thickening or pericholecystic fluid is noted. Negative sonographic Murphy's sign is seen. Common bile duct: Diameter: 2.1 mm. Liver: Mildly increased in echogenicity likely related to fatty infiltration. No mass is seen. Portal vein is patent on color Doppler imaging with normal direction of blood flow towards the liver. Other: None. IMPRESSION: Fatty liver. No other focal abnormality is noted. Electronically Signed   By: Alcide Clever M.D.   On: 09/12/2021 23:46    Procedures Procedures    Medications Ordered in ED Medications  morphine (PF) 4 MG/ML injection 4 mg (4 mg Intravenous Given 09/13/21 0734)  ondansetron (ZOFRAN) injection 4 mg (4 mg Intravenous Given  09/13/21 0734)  iohexol (OMNIPAQUE) 300 MG/ML solution 100 mL (100 mLs Intravenous Contrast Given 09/13/21 0718)  iohexol (OMNIPAQUE) 350 MG/ML injection 100 mL (100 mLs Intravenous Contrast Given 09/13/21 0934)    ED Course/ Medical Decision Making/ A&P                           Medical Decision Making Amount and/or Complexity of Data Reviewed Labs: ordered. Radiology: ordered.  Risk Prescription drug management.   This patient presents to the ED for concern of right flank pain, this involves a number of treatment options, and is a complaint that carries with it a high risk of complications and morbidity.  The differential diagnosis includes renal colic, cholecystitis, pyelonephritis versus appendicitis.  Co morbidities: Discussed in HPI   Brief History:  Patient with no past medical history here with right flank pain  which began about a week ago, exacerbated with ambulation.  Additional symptoms began 2 days ago where she began to feel a tingling sensation to bilateral arms sharp exacerbated with her lying flat.  EMR reviewed including pt PMHx, past surgical history and past visits to ER.   See HPI for more details   Lab Tests:  I ordered and independently interpreted labs.  The pertinent results include:    I personally reviewed all laboratory work and imaging. Metabolic panel without any acute abnormality specifically kidney function within normal limits and no significant electrolyte abnormalities. CBC without leukocytosis or significant anemia.   Imaging Studies:  NAD. I personally reviewed all imaging studies and no acute abnormality found. I agree with radiology interpretation.    Cardiac Monitoring:  NA NA   Medicines ordered:  I ordered medication including morphine, zofran  for pain control. Administered in triage Reevaluation of the patient after these medicines showed that the patient improved I have reviewed the patients home medicines and have made adjustments as needed   Reevaluation:  After the interventions noted above I re-evaluated patient and found that they have :improved   Social Determinants of Health:  The patient's social determinants of health were a factor in the care of this patient    Problem List / ED Course:  Patient here with right flank pain along with bilateral arm tingling which began 1 week ago.  Although bilateral tingling began 2 days ago.  Hemodynamically stable.  Labs are benign.  CT of the abdomen without any appendicitis, no acute process identified.  Ultrasound of the right upper quadrant without any stones, she is afebrile low suspicion for cholecystitis.  UA with no nitrites or leukocytes and small amount of hemoglobin lower suspicion for renal colic.  Lipase level slightly up.  hCG is negative.  Patient's exam is unremarkable, she does  endorse bilateral arm tingling although she does not have any weakness to suggest MSK component although she did report worse with movement.  I do feel that patient's CT dissection study is warranted at this time to further evaluate her symptoms.CT Angio without signs of dissection. I discussed these results at length with patient.  We agreed on a short course of muscle relaxants to help with likely muscle spasms. Although, she is saying pain is worst with eating therefore maybe gallbladder involvement but doubt with normal LFT's.  She is agreeable to plan and treatment at this time.  Patient hemodynamically stable for discharge.   Dispostion:  After consideration of the diagnostic results and the patients response to treatment, I feel that the  patent would benefit from patient follow-up with PCP.  Strict return precautions provided.    Portions of this note were generated with Scientist, clinical (histocompatibility and immunogenetics). Dictation errors may occur despite best attempts at proofreading.   Final Clinical Impression(s) / ED Diagnoses Final diagnoses:  Right flank pain  Muscle spasm  Reflux gastritis    Rx / DC Orders ED Discharge Orders          Ordered    methocarbamol (ROBAXIN) 500 MG tablet  2 times daily        09/13/21 1013    omeprazole (PRILOSEC) 20 MG capsule  Daily        09/13/21 1020              Claude Manges, PA-C 09/13/21 1025    Claude Manges, PA-C 09/13/21 1028    Maia Plan, MD 09/14/21 [phone]

## 2021-09-20 ENCOUNTER — Ambulatory Visit: Payer: BC Managed Care – PPO | Attending: Internal Medicine | Admitting: Internal Medicine

## 2021-09-20 ENCOUNTER — Encounter: Payer: Self-pay | Admitting: Internal Medicine

## 2021-09-20 VITALS — BP 129/86 | HR 93 | Ht 63.0 in | Wt 100.6 lb

## 2021-09-20 DIAGNOSIS — N938 Other specified abnormal uterine and vaginal bleeding: Secondary | ICD-10-CM | POA: Diagnosis not present

## 2021-09-20 DIAGNOSIS — R634 Abnormal weight loss: Secondary | ICD-10-CM | POA: Diagnosis not present

## 2021-09-20 DIAGNOSIS — F411 Generalized anxiety disorder: Secondary | ICD-10-CM | POA: Diagnosis not present

## 2021-09-20 DIAGNOSIS — F321 Major depressive disorder, single episode, moderate: Secondary | ICD-10-CM

## 2021-09-20 DIAGNOSIS — Z7689 Persons encountering health services in other specified circumstances: Secondary | ICD-10-CM

## 2021-09-20 DIAGNOSIS — Z1159 Encounter for screening for other viral diseases: Secondary | ICD-10-CM | POA: Diagnosis not present

## 2021-09-20 DIAGNOSIS — K760 Fatty (change of) liver, not elsewhere classified: Secondary | ICD-10-CM | POA: Diagnosis not present

## 2021-09-20 DIAGNOSIS — R109 Unspecified abdominal pain: Secondary | ICD-10-CM

## 2021-09-20 DIAGNOSIS — Z114 Encounter for screening for human immunodeficiency virus [HIV]: Secondary | ICD-10-CM

## 2021-09-20 NOTE — Patient Instructions (Signed)
[facility] [address], [place], [place] [address] [phone] or [phone] Walk-in urgent care 24/7 for anyone  For [place] Residents ONLY New patient assessments and therapy walk-ins: Monday and Wednesday 8am-11am First and second Friday 1pm-5pm New patient psychiatry and medication management walk-ins: Mondays, Wednesdays, Thursdays, Fridays 8am-11am No psychiatry walk-ins on Tuesday   *Accepts all insurance and uninsured for Urgent Care needs *Accepts Medicaid and uninsured for outpatient treatment   [facility] (Therapy and psychiatry) [facility] (near [employer] [facility]) [address], [address] [place], [place] [address] [phone] Fax: [phone] (INSURANCE REQUIRED-MEDICAID ACCEPTED)   [facility] [place] [address] [place],  [place]  [address] [phone] Call for appointment  [facility] (Therapy only)  [facility] [address], [place], [place] [address] Monday - Friday: 8:30 a.m.-12 p.m. / 1 p.m.-2:30 p.m.  [facility] [address], [place], [place] [address] Monday-Friday: 8:30 a.m.-12 p.m. / 2-3:30 p.m. (INSURANCE REQUIRED -MEDICAID ACCEPTED) They do offer a sliding fee scale $20-$30/session   [facility] [address] [place], [place] [address] Phone: [phone]  [facility] [address] [place] [address]  Phone: [phone] (Does not accept Medicaid) (only one provider accepts Medicare) [facility] [address] ([address] [place], [place] [address] (Accepts Medicaid and Medicare)  [facility]  [address]  [place], [place] [address]  Phone: [phone]  [address] [place], [place] [address] Phone: [phone] (Accepts Medicaid) [facility] (Therapy only)  [address], [place], [place] [address] Phone: [phone]   [facility] (Therapy only)  [address] [place], [place] [address] Phone: [phone] (Accepts Medicaid & Medicare)   [facility] [address] [place], [place] [address] Phone: [phone] (Accepts [place] Health Choice) [facility] [address] [place], [place] [address] Phone: [phone] (Accepts Medicaid) [facility] (Psychiatry only)  [phone] [address], [place], [place] [address] (Accepts Medicaid and Medicare) [facility] (Psychiatry and therapy)  [address], [place], [place] [address] [phone] (Accepts Medicare) [facility] (psychiatry and therapy) [address], [place], [place] [address] [phone]  [facility] [address], [place], [place] [address] [phone] Accept Aetna, BCBS, Cigna, Medcost, Tricare,  and the following types of Medicaid; Alliance, Cardinal, Partners, Vaya, [place] Health Choice, [place] Access Plans, Healthy Blue, [place] Complete, and WellCare, as well as offering a sliding scale and private payment options. Provides In-Office Appointments, Virtual Appointments, and Phone Consultations Offers medication management for ages 4 years old and up, including,  Medication Management for Suboxone and Vivitrol  [facility] [phone] [address] [place] [address], [place], [place] [address] (Accepts Medicaid and Medicare)         19.  [facility] (therapy only)  [address] [place],  [address]            [phone] (Accepts medicare) 20. [facility]  (Suboxone and methodone) [phone] [address] [place] [address], [place],  [address] To Be Eligible for Opioid Treatment at [facility] you must be at least 34 years of age you have already tried other  interventions that were not successful such as opioid   detox, inpatient rehab for opioids, or outpatient counseling specifically for opioid dependency your [facility] drug test must be completely free of benzodiazepines (klonopin, xanax, valium, ativan, or other benz) you have reliable transportation to the [facility] clinic in [place] you recognize that counseling is a critical component of [facility]' Opioid Program and you agree to attend all required counseling sessions you are committed to total drug abstinence and will conscientiously strive to remain free of alcohol, marijuana, and other illicit substances while in treatment you desire a peaceful treatment atmosphere in which personal responsibility and respect toward staff and clients is the norm   21. [facility] [address], [place], [place] [address] Offers SAIOP (Substance Abuse Intensive Outpatient Program) [phone] 22. [facility] [address] [place], [place] [address] [phone] (Accepts medicare)  For those who are tech savvy, go on psychology today, type in your local city (i.e. [place]. [place]) and specify your insurance at the top of the screen after you search. (Medicaid if needed). You can also specify whether you are interested in therapy and psychiatry.  [email or web address]      

## 2021-09-20 NOTE — Progress Notes (Signed)
Patient ID: Alexa White, female    DOB: 05-02-1985  MRN: [id number]  CC: Hospitalization Follow-up   Subjective: Alexa White is a 36 y.o. female who presents for new hosp f/u.  Her 31 yr old daughter is with her. Her concerns today include:   No previous PCP Patient presents for new hospital follow-up. Seen in the emergency room 09/13/2021 with right-sided flank pain x1 week and bilateral arm tingling x2 days. CTA of chest/abdomen negative for any dissection. CT of the abdomen/pelvis negative for any acute findings. Abdominal U/S- except for fatty liver. Lipase mildly elevated at 52.  CBC was normal.  Pregnancy test negative.  No UTI on UA.  Chemistry normal. Symptoms assessed to be MSK in nature.  She was discharged with Robaxin.  Today: "Pain is definitely  better but still there." She started drinking more water because she was concern that it was a kidney stone. The pain is "not shooting up back and not tender to touch like before." Feel it more if she lay on that side, standing in one spot which she does at work and with certain movements. -she did get the Robaxin and takes at bedtime.  Does not feel it has helped.  -no symptoms of heartburn but she has been taking the Prilosec that was prescribed from the ER.  C/o having menses Q 2wks x 6 mths.  Has not keep record of days Lasts 1 wk; heavy for 1st 4 days.  Has to change super tampon Q2-3 hrs. +Clots.  Cramps that are bad the first few days. Also gets swollen gland in one axilla or the other 5-7 days before cycle starts.  Not new. Similar issue when she was younger and was placed on BCP to regulate cycle.  Has had tubal ligation 07/2018. Feels cycles became irregular after last COVID shoot 03/2020.  Not sure of last PAP.    She gives mental health history of depression, anxiety and PTSD.  Used to be followed by [facility] for counseling and medication management.  At one point she was on 7 different medications all of which were  causing horrible side effects for her.  She states that for every symptom she reported they added another medication.  Reports that she is not a big medication person.  She stopped going after the COVID pandemic.  However she still struggles with mental health issues and has loss significant weight which she attributes to her mental health.  Reports poor appetite.  No suicidal ideation.  Mother has schizophrenia.  She worries about that for herself.  Sometimes she feels paranoid at work that people are talking about her behind her back. She would like to get back in with a mental health provider but not with [facility]. Patient Active Problem List   Diagnosis Date Noted   Request for sterilization 07/27/2018   Delivery of viable fetus in abdominal pregnancy 07/26/2018   Normal labor and delivery 07/26/2018   GDM (gestational diabetes mellitus) 05/28/2018   Anxiety and depression 05/23/2018   Supervision of high-risk pregnancy, third trimester 02/20/2018     Current Outpatient Medications on File Prior to Visit  Medication Sig Dispense Refill   methocarbamol (ROBAXIN) 500 MG tablet Take 1 tablet (500 mg total) by mouth 2 (two) times daily for 7 days. 14 tablet 0   omeprazole (PRILOSEC) 20 MG capsule Take 1 capsule (20 mg total) by mouth daily for 14 days. 14 capsule 0   FLUoxetine (PROZAC) 40 MG capsule Take 40 mg  by mouth daily. (Patient not taking: Reported on 09/20/2021)     hydrOXYzine (VISTARIL) 25 MG capsule Take 25 mg by mouth every 4 (four) hours as needed for anxiety. (Patient not taking: Reported on 09/20/2021)     ibuprofen (ADVIL) 600 MG tablet Take 1 tablet (600 mg total) by mouth every 6 (six) hours as needed. 30 tablet 0   Prenatal Vit-Fe Fumarate-FA (PRENATAL MULTIVITAMIN) TABS tablet Take 1 tablet by mouth daily at 12 noon. (Patient not taking: Reported on 09/20/2021)     No current facility-administered medications on file prior to visit.    No Known Allergies  Social History    Socioeconomic History   Marital status: Married    Spouse name: Not on file   Number of children: Not on file   Years of education: Not on file   Highest education level: Not on file  Occupational History   Not on file  Tobacco Use   Smoking status: Former    Packs/day: 1.00    Years: 3.00    Total pack years: 3.00    Types: Cigarettes    Quit date: 06/01/2014    Years since quitting: 7.3   Smokeless tobacco: Never  Vaping Use   Vaping Use: Never used  Substance and Sexual Activity   Alcohol use: Not Currently    Comment: last used in October 2019   Drug use: Yes    Types: Marijuana    Comment: last used in October 2019   Sexual activity: Yes    Birth control/protection: None    Comment: last used depo, unsure of when  Other Topics Concern   Not on file  Social History Narrative   Not on file   Social Determinants of Health   Financial Resource Strain: Not on file  Food Insecurity: Not on file  Transportation Needs: Not on file  Physical Activity: Not on file  Stress: Not on file  Social Connections: Not on file  Intimate Partner Violence: Not on file    Family History  Problem Relation Age of Onset   Mental illness Mother        bipolar    Past Surgical History:  Procedure Laterality Date   nose repair     broken nose- had to be reset   TUBAL LIGATION N/A 07/26/2018   Procedure: POST PARTUM TUBAL LIGATION;  Surgeon: Guss Bunde, MD;  Location: [facility] LD ORS;  Service: Gynecology;  Laterality: N/A;   WISDOM TOOTH EXTRACTION  2011    ROS: Review of Systems Negative except as stated above  PHYSICAL EXAM: BP 129/86   Pulse 93   Ht 5\' 3"  (1.6 m)   Wt 100 lb 9.6 oz (45.6 kg)   LMP 09/18/2021   SpO2 99%   BMI 17.82 kg/m   Wt Readings from Last 3 Encounters:  09/20/21 100 lb 9.6 oz (45.6 kg)  07/18/18 136 lb (61.7 kg)  07/05/18 133 lb 4.8 oz (60.5 kg)    Physical Exam  General appearance - alert, young Caucasian female who appears slightly  underweight for height.   in no distress Mental status - normal mood, behavior, speech, dress, motor activity, and thought processes Neck - supple, no significant adenopathy.  No thyroid enlargement. Chest - clear to auscultation, no wheezes, rales or rhonchi, symmetric air entry Heart - normal rate, regular rhythm, normal S1, S2, no murmurs, rubs, clicks or gallops Abdomen: Normal bowel sounds, nondistended, soft, nontender.  No organomegaly. Extremities - peripheral pulses normal,  no pedal edema, no clubbing or cyanosis MSK: No tenderness on palpation of the thoracic and lumbar spine.  No flank tenderness. GYN: Pap smear not done.  Patient currently on menstrual cycle.    09/20/2021    9:56 AM 06/05/2018   10:37 AM  Depression screen PHQ 2/9  Decreased Interest 1 1  Down, Depressed, Hopeless 1 1  PHQ - 2 Score 2 2  Altered sleeping 2 1  Tired, decreased energy 2 3  Change in appetite 3 1  Feeling bad or failure about yourself  2 1  Trouble concentrating 1 0  Moving slowly or fidgety/restless 1 0  Suicidal thoughts 1 0  PHQ-9 Score 14 8  Difficult doing work/chores  Not difficult at all      09/20/2021    9:56 AM  GAD 7 : Generalized Anxiety Score  Nervous, Anxious, on Edge 3  Control/stop worrying 3  Worry too much - different things 3  Trouble relaxing 2  Restless 3  Easily annoyed or irritable 1  Afraid - awful might happen 0  Total GAD 7 Score 15        Latest Ref Rng & Units 09/12/2021   11:12 PM 06/28/2018    8:44 PM 02/28/2018    3:45 PM  CMP  Glucose 70 - 99 mg/dL 564  74  59   BUN 6 - 20 mg/dL 12  8  9    Creatinine 0.44 - 1.00 mg/dL  3.32  9.51   Sodium 135 - 145 mmol/L 136  137  138   Potassium 3.5 - 5.1 mmol/L 3.7  3.4  3.8   Chloride 98 - 111 mmol/L 106  104  103   CO2 22 - 32 mmol/L 24  21  21    Calcium 8.9 - 10.3 mg/dL 9.0  8.6  8.8   Total Protein 6.5 - 8.1 g/dL 6.6  6.0  6.1   Total Bilirubin 0.3 - 1.2 mg/dL 0.9  0.5  8.84   Alkaline Phos 38 -  126 U/L 45  147  48   AST 15 - 41 U/L 15  15  16    ALT 0 - 44 U/L 11  15  19     Lipid Panel  No results found for: "CHOL", "TRIG", "HDL", "CHOLHDL", "VLDL", "LDLCALC", "LDLDIRECT"  CBC    Component Value Date/Time   WBC 10.3 09/12/2021 2312   RBC 3.83 (L) 09/12/2021 2312   HGB 12.9 09/12/2021 2312   HGB 10.1 (L) 05/23/2018 0813   HGB 10.6 06/19/2014 0000   HCT 37.3 09/12/2021 2312   HCT 28.4 (L) 05/23/2018 0813   PLT 283 09/12/2021 2312   PLT 276 05/23/2018 0813   MCV 97.4 09/12/2021 2312   MCV 97 05/23/2018 0813   MCH 33.7 09/12/2021 2312   MCHC 34.6 09/12/2021 2312   RDW 11.2 (L) 09/12/2021 2312   RDW 11.6 (L) 05/23/2018 0813   LYMPHSABS 3.8 09/12/2021 2312   LYMPHSABS 2.3 02/28/2018 1545   MONOABS 0.8 09/12/2021 2312   EOSABS 0.2 09/12/2021 2312   EOSABS 0.2 02/28/2018 1545   BASOSABS 0.1 09/12/2021 2312   BASOSABS 0.0 02/28/2018 1545    ASSESSMENT AND PLAN: 1. Encounter to establish care   2. Flank pain, acute Most likely musculoskeletal in nature having reviewed all of her imaging studies.  I recommend using a heating pad for 10 to 15 minutes several days a week over the next 1 to 2 weeks.  Also recommend Naprosyn twice a  day with food for the next 7 days.  3. DUB (dysfunctional uterine bleeding) We will check pelvic ultrasound and hormone levels.  Discussed trying her with birth control pills for 2 to 3 months to try to regulate cycles but patient declines at this time.  She is agreeable to referral to the gynecologist.  She will also have her Pap smear done by the gynecologist. - TSH+T4F+T3Free - FSH/LH - Ambulatory referral to Gynecology - US Pelvic Complete With Transvaginal; Future  4. Unintended weight loss Patient attributes this to her mental health issues. We will check thyroid level and screening HIV test. Advised eating smaller but more frequent meals. - TSH+T4F+T3Free  5. 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HCC) 6. GAD (generalized anxiety  disorder) Patient declines medication at this time.  She would like to be referred to behavioral health.  I told her I will print information of behavioral health resources available in [place] and she can choose to call 1 or 2 of them and get in with the one that offers appointment earliest.  She is agreeable to this.  7. Fatty liver Discussed fatty liver.  I do not advise any weight loss at this time.  8. Screening for HIV (human immunodeficiency virus) - HIV antibody (with reflex)  9. Need for hepatitis C screening test - Hepatitis C Antibody    Patient was given the opportunity to ask questions.  Patient verbalized understanding of the plan and was able to repeat key elements of the plan.   This documentation was completed using Paediatric nurse.  Any transcriptional errors are unintentional.  No orders of the defined types were placed in this encounter.    Requested Prescriptions    No prescriptions requested or ordered in this encounter    No follow-ups on file.  Jonah Blue, MD, FACP

## 2021-09-20 NOTE — Progress Notes (Signed)
Pt has concerns about cycle. Pt states her cycle comed every 2wks.

## 2021-09-21 LAB — TSH+T4F+T3FREE
Free T4: 1.58 ng/dL (ref 0.82–1.77)
T3, Free: 4.2 pg/mL (ref 2.0–4.4)
TSH: 1.94 u[IU]/mL (ref 0.450–4.500)

## 2021-09-21 LAB — HEPATITIS C ANTIBODY: Hep C Virus Ab: NONREACTIVE

## 2021-09-21 LAB — FSH/LH
FSH: 7.8 m[IU]/mL
LH: 7.5 m[IU]/mL

## 2021-09-21 LAB — HIV ANTIBODY (ROUTINE TESTING W REFLEX): HIV Screen 4th Generation wRfx: NONREACTIVE

## 2021-09-22 ENCOUNTER — Ambulatory Visit
Admission: RE | Admit: 2021-09-22 | Discharge: 2021-09-22 | Disposition: A | Discharge status: Home / Self Care | Payer: BC Managed Care – PPO | Source: Ambulatory Visit | Attending: Internal Medicine | Admitting: Internal Medicine

## 2021-09-22 DIAGNOSIS — N938 Other specified abnormal uterine and vaginal bleeding: Secondary | ICD-10-CM | POA: Diagnosis not present

## 2021-09-23 DIAGNOSIS — F429 Obsessive-compulsive disorder, unspecified: Secondary | ICD-10-CM | POA: Diagnosis not present

## 2021-09-23 DIAGNOSIS — F322 Major depressive disorder, single episode, severe without psychotic features: Secondary | ICD-10-CM | POA: Diagnosis not present

## 2021-09-23 DIAGNOSIS — F411 Generalized anxiety disorder: Secondary | ICD-10-CM | POA: Diagnosis not present

## 2021-09-23 DIAGNOSIS — Z1331 Encounter for screening for depression: Secondary | ICD-10-CM | POA: Diagnosis not present

## 2021-09-27 DIAGNOSIS — F322 Major depressive disorder, single episode, severe without psychotic features: Secondary | ICD-10-CM | POA: Diagnosis not present

## 2021-09-27 DIAGNOSIS — F411 Generalized anxiety disorder: Secondary | ICD-10-CM | POA: Diagnosis not present

## 2021-09-30 ENCOUNTER — Encounter: Payer: Self-pay | Admitting: Radiology

## 2021-09-30 ENCOUNTER — Ambulatory Visit (INDEPENDENT_AMBULATORY_CARE_PROVIDER_SITE_OTHER): Payer: BC Managed Care – PPO | Admitting: Radiology

## 2021-09-30 ENCOUNTER — Other Ambulatory Visit (HOSPITAL_COMMUNITY)
Admission: RE | Admit: 2021-09-30 | Discharge: 2021-09-30 | Disposition: A | Discharge status: Home / Self Care | Payer: BC Managed Care – PPO | Source: Ambulatory Visit | Attending: Radiology | Admitting: Radiology

## 2021-09-30 VITALS — BP 102/60 | Ht 63.0 in | Wt 99.0 lb

## 2021-09-30 DIAGNOSIS — Z01419 Encounter for gynecological examination (general) (routine) without abnormal findings: Secondary | ICD-10-CM | POA: Diagnosis not present

## 2021-09-30 DIAGNOSIS — N938 Other specified abnormal uterine and vaginal bleeding: Secondary | ICD-10-CM

## 2021-09-30 DIAGNOSIS — Z01411 Encounter for gynecological examination (general) (routine) with abnormal findings: Secondary | ICD-10-CM | POA: Diagnosis not present

## 2021-09-30 NOTE — Progress Notes (Signed)
Alexa White 10-Jul-1985 [id number]   History:  36 y.o. G4P3 presents for annual exam. Referred from PCP for DUB.  Ultrasound normal 09/22/21. Complains of increased irregular vaginal bleeding since Covid19 vaccination 03/2020.   Gynecologic History Patient's last menstrual period was 09/18/2021 (exact date). Period Pattern: (!) Irregular Menstrual Flow: Heavy (heavy x's 3 days) Menstrual Control: Tampon, Thin pad Dysmenorrhea: (!) Mild Dysmenorrhea Symptoms: Cramping Contraception/Family planning: tubal ligation Sexually active: yes Last Pap: 2020. Results were: normal Last mammogram: n/a.   Obstetric History OB History  Gravida Para Term Preterm AB Living  4 3 3   1 3   SAB IAB Ectopic Multiple Live Births    1   0 3    # Outcome Date GA Lbr Len/2nd Weight Sex Delivery Anes PTL Lv  4 Term 07/26/18 [redacted]w[redacted]d / 00:20 7 lb 6 oz (3.345 kg) M Vag-Spont None  LIV  3 Term 01/22/15 [redacted]w[redacted]d 09:01 / 00:15 7 lb 3.7 oz (3.28 kg) F Vag-Spont EPI  LIV  2 IAB 2011          1 Term 09/11/05   7 lb 13 oz (3.544 kg) M Vag-Spont   LIV     The following portions of the patient's history were reviewed and updated as appropriate: allergies, current medications, past family history, past medical history, past social history, past surgical history, and problem list.  Review of Systems Pertinent items noted in HPI and remainder of comprehensive ROS otherwise negative.   Past medical history, past surgical history, family history and social history were all reviewed and documented in the EPIC chart.  Narrative & Impression  CLINICAL DATA:  Dysfunctional uterine bleeding   EXAM: TRANSABDOMINAL AND TRANSVAGINAL ULTRASOUND OF PELVIS   TECHNIQUE: Both transabdominal and transvaginal ultrasound examinations of the pelvis were performed. Transabdominal technique was performed for global imaging of the pelvis including uterus, ovaries, adnexal regions, and pelvic cul-de-sac. It was necessary to proceed  with endovaginal exam following the transabdominal exam to visualize the endometrium and adnexal structures.   COMPARISON:  09/13/2021   FINDINGS: Uterus   Measurements: 7.0 x 4.0 x 5.6 cm = volume: 82 mL. No fibroids or other mass visualized.   Endometrium   Thickness: 4 mm. Trace endometrial fluid. No focal abnormality identified.   Right ovary   Measurements: 2.2 x 2.6 x 3.0 cm = volume: 8.9 mL. Normal appearance/no adnexal mass.   Left ovary   Measurements: 2.1 x 1.3 x 2.8 cm = volume: 3.9 mL. Normal appearance/no adnexal mass.   Other findings   No abnormal free fluid.   IMPRESSION: 1. Age-appropriate pelvic ultrasound. No etiology identified for reported dysfunctional uterine bleeding. If bleeding remains unresponsive to hormonal or medical therapy, sonohysterogram should be considered for focal lesion work-up. (Ref: Radiological Reasoning: Algorithmic Workup of Abnormal Vaginal Bleeding with Endovaginal Sonography and Sonohysterography. AJR [id number])     Electronically Signed   By: ; [id number] M.D.   On: 09/22/2021 17:46    Exam:  Vitals:   09/30/21 1028  BP: 102/60  Weight: 99 lb (44.9 kg)  Height: 5\' 3"  (1.6 m)   Body mass index is 17.54 kg/m.  General appearance:  Normal, thin Thyroid:  Symmetrical, normal in size, without palpable masses or nodularity. Respiratory  Auscultation:  Clear without wheezing or rhonchi Cardiovascular  Auscultation:  Regular rate, without rubs, murmurs or gallops  Edema/varicosities:  Not grossly evident Abdominal  Soft,nontender, without masses, guarding or rebound.  Liver/spleen:  No organomegaly noted  Hernia:  None appreciated  Skin  Inspection:  Grossly normal Breasts: Examined lying and sitting.   Right: Without masses, retractions, nipple discharge or axillary adenopathy.   Left: Without masses, retractions, nipple discharge or axillary adenopathy. Genitourinary   Inguinal/mons:  Normal  without inguinal adenopathy  External genitalia:  Normal appearing vulva with no masses, tenderness, or lesions  BUS/Urethra/Skene's glands:  Normal without masses or exudate  Vagina:  Normal appearing with normal color and discharge, no lesions  Cervix:  Normal appearing without discharge or lesions  Uterus:  Normal in size, shape and contour.  Mobile, nontender  Adnexa/parametria:     Rt: Normal in size, without masses or tenderness.   Lt: Normal in size, without masses or tenderness.  Anus and perineum: Normal   Patient informed chaperone available to be present for breast and pelvic exam. Patient has requested no chaperone to be present. Patient has been advised what will be completed during breast and pelvic exam.   Assessment/Plan:   1. Well woman exam with routine gynecological exam  - Cytology - PAP( [facility])  2. DUB (dysfunctional uterine bleeding) Offered hormonal and non hormonal options including IUD, OCPs, patch, ring or Lysteda Will consider and let me know how she would like to proceed.   Discussed SBE, pap and STI screening as directed/appropriate. Recommend of exercise weekly, including weight bearing exercise. Encouraged the use of seatbelts and sunscreen. Return in 1 year for annual or as needed.   Arlie Solomons B WHNP-BC 11:05 AM 09/30/2021

## 2021-10-04 DIAGNOSIS — F411 Generalized anxiety disorder: Secondary | ICD-10-CM | POA: Diagnosis not present

## 2021-10-04 DIAGNOSIS — F429 Obsessive-compulsive disorder, unspecified: Secondary | ICD-10-CM | POA: Diagnosis not present

## 2021-10-04 DIAGNOSIS — F322 Major depressive disorder, single episode, severe without psychotic features: Secondary | ICD-10-CM | POA: Diagnosis not present

## 2021-10-05 LAB — CYTOLOGY - PAP
Chlamydia: NEGATIVE
Comment: NEGATIVE
Comment: NEGATIVE
Comment: NEGATIVE
Comment: NORMAL
Diagnosis: UNDETERMINED — AB
High risk HPV: NEGATIVE
Neisseria Gonorrhea: NEGATIVE
Trichomonas: NEGATIVE

## 2021-10-06 ENCOUNTER — Telehealth: Payer: Self-pay | Admitting: *Deleted

## 2021-10-06 MED ORDER — FLUCONAZOLE 150 MG PO TABS: 150.0000 mg | ORAL_TABLET | Freq: Once | ORAL | 0 refills | Status: AC

## 2021-10-06 MED ORDER — METRONIDAZOLE 0.75 % VA GEL: 1.0000 | Freq: Every day | VAGINAL | 0 refills | Status: AC

## 2021-10-06 NOTE — Telephone Encounter (Signed)
-----   Message from Tanda Rockers, NP sent at 10/06/2021  8:29 AM EDT ----- ASCUS pap, HPV neg, repeat 1 year. +yeast and BV, offer treatment with fluconazole 150mg  po once, flagyl 500mg  po BIDx 7 days or metrogel qhs x 5 nights.

## 2021-10-06 NOTE — Telephone Encounter (Signed)
Returned call to patient. Results reviewed with patient as seen below from [place], NP and patient verbalized understanding. Patient states she is having vaginal discharge and requests treatment with diflucan and metrogel. Pharmacy confirmed as [facility] in [place]. Patient advised to return call if symptoms do not resolve with treatment. Patient agreeable.   Medications sent to confirmed pharmacy on file. Diflucan #1,0RF and metrogel #70g, 0RF.   Encounter closed.

## 2021-10-06 NOTE — Telephone Encounter (Signed)
Message left to return call to Ceci Taliaferro at [phone].   

## 2021-10-11 DIAGNOSIS — F322 Major depressive disorder, single episode, severe without psychotic features: Secondary | ICD-10-CM | POA: Diagnosis not present

## 2021-10-11 DIAGNOSIS — F429 Obsessive-compulsive disorder, unspecified: Secondary | ICD-10-CM | POA: Diagnosis not present

## 2021-10-11 DIAGNOSIS — F411 Generalized anxiety disorder: Secondary | ICD-10-CM | POA: Diagnosis not present

## 2021-10-18 DIAGNOSIS — F411 Generalized anxiety disorder: Secondary | ICD-10-CM | POA: Diagnosis not present

## 2021-10-18 DIAGNOSIS — F322 Major depressive disorder, single episode, severe without psychotic features: Secondary | ICD-10-CM | POA: Diagnosis not present

## 2021-10-18 DIAGNOSIS — F429 Obsessive-compulsive disorder, unspecified: Secondary | ICD-10-CM | POA: Diagnosis not present

## 2021-10-25 DIAGNOSIS — F4389 Other reactions to severe stress: Secondary | ICD-10-CM | POA: Diagnosis not present

## 2021-10-25 DIAGNOSIS — F331 Major depressive disorder, recurrent, moderate: Secondary | ICD-10-CM | POA: Diagnosis not present

## 2021-10-28 DIAGNOSIS — F429 Obsessive-compulsive disorder, unspecified: Secondary | ICD-10-CM | POA: Diagnosis not present

## 2021-10-28 DIAGNOSIS — F411 Generalized anxiety disorder: Secondary | ICD-10-CM | POA: Diagnosis not present

## 2021-10-28 DIAGNOSIS — F331 Major depressive disorder, recurrent, moderate: Secondary | ICD-10-CM | POA: Diagnosis not present

## 2021-11-01 DIAGNOSIS — F331 Major depressive disorder, recurrent, moderate: Secondary | ICD-10-CM | POA: Diagnosis not present

## 2021-11-01 DIAGNOSIS — F4389 Other reactions to severe stress: Secondary | ICD-10-CM | POA: Diagnosis not present

## 2021-11-08 DIAGNOSIS — F4389 Other reactions to severe stress: Secondary | ICD-10-CM | POA: Diagnosis not present

## 2021-11-08 DIAGNOSIS — F331 Major depressive disorder, recurrent, moderate: Secondary | ICD-10-CM | POA: Diagnosis not present

## 2021-11-22 ENCOUNTER — Ambulatory Visit: Payer: BC Managed Care – PPO | Attending: Internal Medicine | Admitting: Internal Medicine

## 2021-11-22 ENCOUNTER — Encounter: Payer: Self-pay | Admitting: Internal Medicine

## 2021-11-22 VITALS — BP 112/75 | HR 81 | Temp 98.1°F | Ht 64.0 in | Wt 111.4 lb

## 2021-11-22 DIAGNOSIS — F1090 Alcohol use, unspecified, uncomplicated: Secondary | ICD-10-CM | POA: Diagnosis not present

## 2021-11-22 DIAGNOSIS — F32A Depression, unspecified: Secondary | ICD-10-CM

## 2021-11-22 DIAGNOSIS — F411 Generalized anxiety disorder: Secondary | ICD-10-CM | POA: Diagnosis not present

## 2021-11-22 DIAGNOSIS — N938 Other specified abnormal uterine and vaginal bleeding: Secondary | ICD-10-CM

## 2021-11-22 DIAGNOSIS — Z2821 Immunization not carried out because of patient refusal: Secondary | ICD-10-CM

## 2021-11-22 MED ORDER — MIRTAZAPINE 30 MG PO TABS: 30.0000 mg | ORAL_TABLET | Freq: Every day | ORAL | 0 refills | Status: DC

## 2021-11-22 NOTE — Progress Notes (Signed)
Patient ID: Alexa White, female    DOB: 08/22/1985  MRN: [id number]  CC: Follow-up   Subjective: Alexa White is a 36 y.o. female who presents for 44mth f/u Her concerns today include:  Patient with history of depression, anxiety disorder, PTSD, fatty liver  DUB:  pelvic US was okay.  TSH, FSH/LH nl Saw GYN NP 09/30/2021.  Discussed hormonal and nonhormonal methods to help normalize her menstrual cycles.  Patient wanted to think about it.  Today she tells me she has noted that every third menstrual cycle tends to be off with slightly prolonged bleeding.  She gives example of this: Had a cycle 09/18/2021 and then again 10/14/2021 both of which were heavy and lasted 7 days.  Her next cycle was 11/13/2021 and lasted 2 to 3 days.  It went off for 3 days then came back on for 2 additional days..  MDD/anx/PTSD: Got in at [facility].  She was having wkly counseling sessions via Zoom. Saw psychiatrist there as well.  Started on Remeron 15 mg then dose increase to 30 mg to help with sleep.  Plan was to add another med.  Missed last zoom appt by 12 mins because she overslept.  Has to pay $135 late fee before they will see her again. Will pay them but can not afford it right now.  Wants to know if I can prescribe the Remeron until she can pay them the late fee -I note that she has gained 12 pounds since we saw her 2 months ago.  I thought this may be due to the Remeron.  However patient tells me she thinks it is due to drinking beer.  She started drinking 40 ounce beer every night when she gets off work..   Patient Active Problem List   Diagnosis Date Noted   GAD (generalized anxiety disorder) 09/20/2021   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[place]) 09/20/2021   Unintended weight loss 09/20/2021   DUB (dysfunctional uterine bleeding) 09/20/2021   Request for sterilization 07/27/2018   Delivery of viable fetus in abdominal pregnancy 07/26/2018   Normal labor and delivery 07/26/2018   GDM (gestational diabetes  mellitus) 05/28/2018   Anxiety and depression 05/23/2018   Supervision of high-risk pregnancy, third trimester 02/20/2018     Current Outpatient Medications on File Prior to Visit  Medication Sig Dispense Refill   mirtazapine (REMERON) 15 MG tablet Take 15 mg by mouth at bedtime.     No current facility-administered medications on file prior to visit.    No Known Allergies  Social History   Socioeconomic History   Marital status: Married    Spouse name: Not on file   Number of children: Not on file   Years of education: Not on file   Highest education level: Not on file  Occupational History   Not on file  Tobacco Use   Smoking status: Former    Packs/day: 1.00    Years: 3.00    Total pack years: 3.00    Types: Cigarettes    Quit date: 06/01/2014    Years since quitting: 7.4   Smokeless tobacco: Never   Tobacco comments:    VAPES "weed"  Vaping Use   Vaping Use: Never used  Substance and Sexual Activity   Alcohol use: Yes    Comment: socially   Drug use: Yes    Types: Marijuana   Sexual activity: Yes    Partners: Male    Birth control/protection: Surgical    Comment: tubal  Other  Topics Concern   Not on file  Social History Narrative   Not on file   Social Determinants of Health   Financial Resource Strain: Not on file  Food Insecurity: Not on file  Transportation Needs: Not on file  Physical Activity: Not on file  Stress: Not on file  Social Connections: Not on file  Intimate Partner Violence: Not on file    Family History  Problem Relation Age of Onset   Mental illness Mother        bipolar   Heart disease Father     Past Surgical History:  Procedure Laterality Date   nose repair     broken nose- had to be reset   TUBAL LIGATION N/A 07/26/2018   Procedure: POST PARTUM TUBAL LIGATION;  Surgeon: Guss Bunde, MD;  Location: [facility];  Service: Gynecology;  Laterality: N/A;   WISDOM TOOTH EXTRACTION  2011    ROS: Review of  Systems Negative except as stated above  PHYSICAL EXAM: BP 112/75   Pulse 81   Temp 98.1 F (36.7 C) (Oral)   Ht 5\' 4"  (1.626 m)   Wt 111 lb 6.4 oz (50.5 kg)   LMP 11/13/2021   SpO2 99%   BMI 19.12 kg/m   Wt Readings from Last 3 Encounters:  11/22/21 111 lb 6.4 oz (50.5 kg)  09/30/21 99 lb (44.9 kg)  09/20/21 100 lb 9.6 oz (45.6 kg)    Physical Exam  General appearance - alert, well appearing, and in no distress Mental status - normal mood, behavior, speech, dress, motor activity, and thought processes Neck - supple, no significant adenopathy Chest - clear to auscultation, no wheezes, rales or rhonchi, symmetric air entry Heart - normal rate, regular rhythm, normal S1, S2, no murmurs, rubs, clicks or gallops Extremities - peripheral pulses normal, no pedal edema, no clubbing or cyanosis     09/20/2021    9:56 AM 06/05/2018   10:37 AM  Depression screen PHQ 2/9  Decreased Interest 1 1  Down, Depressed, Hopeless 1 1  PHQ - 2 Score 2 2  Altered sleeping 2 1  Tired, decreased energy 2 3  Change in appetite 3 1  Feeling bad or failure about yourself  2 1  Trouble concentrating 1 0  Moving slowly or fidgety/restless 1 0  Suicidal thoughts 1 0  PHQ-9 Score 14 8  Difficult doing work/chores  Not difficult at all       Latest Ref Rng & Units 09/12/2021   11:12 PM 06/28/2018    8:44 PM 02/28/2018    3:45 PM  CMP  Glucose 70 - 99 mg/dL 103  74  59   BUN 6 - 20 mg/dL 12  8  9    Creatinine QA348G - 1.00 mg/dL 0.64  0.46  0.38   Sodium 135 - 145 mmol/L 136  137  138   Potassium 3.5 - 5.1 mmol/L 3.7  3.4  3.8   Chloride 98 - 111 mmol/L 106  104  103   CO2 22 - 32 mmol/L 24  21  21    Calcium 8.9 - 10.3 mg/dL 9.0  8.6  8.8   Total Protein 6.5 - 8.1 g/dL 6.6  6.0  6.1   Total Bilirubin 0.3 - 1.2 mg/dL 0.9  0.5  <0.2   Alkaline Phos 38 - 126 U/L 45  147  48   AST 15 - 41 U/L 15  15  16    ALT 0 - 44 U/L  11  15  19     Lipid Panel  No results found for: "CHOL", "TRIG", "HDL",  "CHOLHDL", "VLDL", "LDLCALC", "LDLDIRECT"  CBC    Component Value Date/Time   WBC 10.3 09/12/2021 2312   RBC 3.83 (L) 09/12/2021 2312   HGB 12.9 09/12/2021 2312   HGB 10.1 (L) 05/23/2018 0813   HGB 10.6 06/19/2014 0000   HCT 37.3 09/12/2021 2312   HCT 28.4 (L) 05/23/2018 0813   PLT 283 09/12/2021 2312   PLT 276 05/23/2018 0813   MCV 97.4 09/12/2021 2312   MCV 97 05/23/2018 0813   MCH 33.7 09/12/2021 2312   MCHC 34.6 09/12/2021 2312   RDW 11.2 (L) 09/12/2021 2312   RDW 11.6 (L) 05/23/2018 0813   LYMPHSABS 3.8 09/12/2021 2312   LYMPHSABS 2.3 02/28/2018 1545   MONOABS 0.8 09/12/2021 2312   EOSABS 0.2 09/12/2021 2312   EOSABS 0.2 02/28/2018 1545   BASOSABS 0.1 09/12/2021 2312   BASOSABS 0.0 02/28/2018 1545    ASSESSMENT AND PLAN:  1. Depressive disorder 2. GAD (generalized anxiety disorder) Advised patient that I will send a refill on Remeron 30 mg for her.  However I encouraged her to pay off her debt with [employer] works so that she can continue her counseling sessions and seeing the psychiatrist.  She tells me that she plans to do so.   3. Alcohol use disorder Went over with her how much alcohol is too much for female in 1 setting.  It should be no more than 1 standard drink a day.  Advised that she is drinking far above and beyond what is recommended.  Strongly encouraged her to quit or cut back significantly before it starts adversely affecting her health, relationships, employment etc.  Also advised against drinking this excessively while being on any psychiatric medication for which she is currently on Remeron.  She expressed understanding and states that she will cut back.  4. Influenza vaccination declined Recommended.  Patient declined.  5.  DUB -cycles have improved.  She will follow-up with GYN if she changes her mind about trying out a hormonal method  Patient was given the opportunity to ask questions.  Patient verbalized understanding of the plan and was able to  repeat key elements of the plan.   This documentation was completed using Radio producer.  Any transcriptional errors are unintentional.  No orders of the defined types were placed in this encounter.    Requested Prescriptions    No prescriptions requested or ordered in this encounter    No follow-ups on file.  Karle Plumber, MD, FACP

## 2021-11-22 NOTE — Progress Notes (Signed)
Pt requesting if pcp can continue Rx.  Pt currently on Remeron.

## 2021-12-05 ENCOUNTER — Other Ambulatory Visit: Payer: Self-pay | Admitting: Internal Medicine

## 2021-12-05 MED ORDER — MIRTAZAPINE 30 MG PO TABS: 30.0000 mg | ORAL_TABLET | Freq: Every day | ORAL | 2 refills | Status: DC

## 2021-12-22 ENCOUNTER — Encounter: Payer: Self-pay | Admitting: Internal Medicine

## 2021-12-22 ENCOUNTER — Telehealth: Payer: Self-pay | Admitting: Internal Medicine

## 2021-12-22 ENCOUNTER — Ambulatory Visit: Payer: BC Managed Care – PPO | Attending: Internal Medicine | Admitting: Internal Medicine

## 2021-12-22 VITALS — BP 119/75 | HR 88 | Temp 97.7°F | Ht 64.0 in | Wt 108.0 lb

## 2021-12-22 DIAGNOSIS — K589 Irritable bowel syndrome without diarrhea: Secondary | ICD-10-CM

## 2021-12-22 DIAGNOSIS — R152 Fecal urgency: Secondary | ICD-10-CM

## 2021-12-22 DIAGNOSIS — R159 Full incontinence of feces: Secondary | ICD-10-CM

## 2021-12-22 DIAGNOSIS — F418 Other specified anxiety disorders: Secondary | ICD-10-CM | POA: Diagnosis not present

## 2021-12-22 DIAGNOSIS — F322 Major depressive disorder, single episode, severe without psychotic features: Secondary | ICD-10-CM

## 2021-12-22 MED ORDER — BUSPIRONE HCL 5 MG PO TABS: 5.0000 mg | ORAL_TABLET | Freq: Two times a day (BID) | ORAL | 1 refills | Status: DC

## 2021-12-22 NOTE — Progress Notes (Signed)
Patient ID: Alexa White, female    DOB: 1985/02/18  MRN: [id number]  CC: Depression (Depression f/u. Joslyn Hy sweating on bottom when going to the bathroom. /Episode of an uncontrolled bowel movement on 12/12/2021./No to flu vax. )   Subjective: Alexa White is a 36 y.o. female who presents for 1 mth f/u Her concerns today include:  Patient with history of depression, anxiety disorder, PTSD, fatty liver   C/o excessive sweating over buttock and b/w gluteal fold when she has to have a BM.  Started 2016 after birth of her daughter and worse over past year.  Has sweat through her pants several times at work in regards to this. Tried wearing lighter/thin material but still has the issue Usually has 2-3 BM/day.  Bowel movements usually soft,  has 1 running bowel movement a day but also gets intermittent constipation.  Excessive sweating more intense when constipated.   Episode of incontinence last wk when she tried to pass gas.  More gassy in the mornings.   Endorses bloating around menses.    She did cut back on beer drinking from 40 oz per night to 24 oz can about every other day but most of the times if she does not finish it.  Still taking Remeron and some days she feels good and other days feels like it may not be working as well. Has a lot of depression/anxiety Having a hard time at work.  Always feels like someone is talking about her and "doing me wrong.  I think every body is out to get me lately. Even when I'm driving." Argues with her husband a lot. Paranoid that he is cheating on her even though she knows he is not that type of person.  -thought she had  conversations with her son that her son or other family members do not recall.  Spouse suggest that she has dreams and thinks they are real.   -gets aggravated with her kids easily.  -denies SI ideation but was having a rough day earlier this wk and looked up how much Remeron she would need to take to overdose.  Denies  HI. Can not afford to go back to [employer] because she owns them money.  She also does not like having to start over with a new therapist/psychiatrist after she has vested some time.   Patient Active Problem List   Diagnosis Date Noted   Alcohol use disorder 11/22/2021   GAD (generalized anxiety disorder) 09/20/2021   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[place]) 09/20/2021   Unintended weight loss 09/20/2021   DUB (dysfunctional uterine bleeding) 09/20/2021   Request for sterilization 07/27/2018   Delivery of viable fetus in abdominal pregnancy 07/26/2018   Normal labor and delivery 07/26/2018   GDM (gestational diabetes mellitus) 05/28/2018   Anxiety and depression 05/23/2018   Supervision of high-risk pregnancy, third trimester 02/20/2018     Current Outpatient Medications on File Prior to Visit  Medication Sig Dispense Refill   mirtazapine (REMERON) 30 MG tablet Take 1 tablet (30 mg total) by mouth at bedtime. 30 tablet 2   No current facility-administered medications on file prior to visit.    No Known Allergies  Social History   Socioeconomic History   Marital status: Married    Spouse name: Not on file   Number of children: Not on file   Years of education: Not on file   Highest education level: Not on file  Occupational History   Not on file  Tobacco Use  Smoking status: Former    Packs/day: 1.00    Years: 3.00    Total pack years: 3.00    Types: Cigarettes    Quit date: 06/01/2014    Years since quitting: 7.5   Smokeless tobacco: Never   Tobacco comments:    VAPES "weed"  Vaping Use   Vaping Use: Never used  Substance and Sexual Activity   Alcohol use: Yes    Comment: socially   Drug use: Yes    Types: Marijuana   Sexual activity: Yes    Partners: Male    Birth control/protection: Surgical    Comment: tubal  Other Topics Concern   Not on file  Social History Narrative   Not on file   Social Determinants of Health   Financial Resource Strain: Not on file   Food Insecurity: Not on file  Transportation Needs: Not on file  Physical Activity: Not on file  Stress: Not on file  Social Connections: Not on file  Intimate Partner Violence: Not on file    Family History  Problem Relation Age of Onset   Mental illness Mother        bipolar   Heart disease Father     Past Surgical History:  Procedure Laterality Date   nose repair     broken nose- had to be reset   TUBAL LIGATION N/A 07/26/2018   Procedure: POST PARTUM TUBAL LIGATION;  Surgeon: Guss Bunde, MD;  Location: [facility];  Service: Gynecology;  Laterality: N/A;   WISDOM TOOTH EXTRACTION  2011    ROS: Review of Systems Negative except as stated above  PHYSICAL EXAM: BP 119/75 (BP Location: Left Arm, Patient Position: Sitting, Cuff Size: Normal)   Pulse 88   Temp 97.7 F (36.5 C) (Oral)   Ht 5\' 4"  (1.626 m)   Wt 108 lb (49 kg)   LMP 11/13/2021   SpO2 97%   BMI 18.54 kg/m   Wt Readings from Last 3 Encounters:  12/22/21 108 lb (49 kg)  11/22/21 111 lb 6.4 oz (50.5 kg)  09/30/21 99 lb (44.9 kg)    Physical Exam  General appearance - alert, well appearing, and in no distress Mental status -patient is very tearful when describing her mental health symptoms. Chest - clear to auscultation, no wheezes, rales or rhonchi, symmetric air entry Heart - normal rate, regular rhythm, normal S1, S2, no murmurs, rubs, clicks or gallops Rectal -CMA Oretha Milch present: No dermatitis noted on either buttock.  She has small pea size lump palpated near rectum on the left side.     12/22/2021    9:18 AM 12/22/2021    9:17 AM 09/20/2021    9:56 AM  Depression screen PHQ 2/9  Decreased Interest 1 1 1   Down, Depressed, Hopeless 3 3 1   PHQ - 2 Score 4 4 2   Altered sleeping 1 1 2   Tired, decreased energy 3 3 2   Change in appetite 3 3 3   Feeling bad or failure about yourself  3 3 2   Trouble concentrating 2 2 1   Moving slowly or fidgety/restless 1 1 1   Suicidal thoughts 1 1 1    PHQ-9 Score 18 18 14       12/22/2021    9:18 AM 09/20/2021    9:56 AM  GAD 7 : Generalized Anxiety Score  Nervous, Anxious, on Edge 3 3  Control/stop worrying 3 3  Worry too much - different things 3 3  Trouble relaxing 3 2  Restless  3 3  Easily annoyed or irritable 3 1  Afraid - awful might happen 1 0  Total GAD 7 Score 19 15        Latest Ref Rng & Units 09/12/2021   11:12 PM 06/28/2018    8:44 PM 02/28/2018    3:45 PM  CMP  Glucose 70 - 99 mg/dL 103  74  59   BUN 6 - 20 mg/dL 12  8  9    Creatinine 0.44 - 1.00 mg/dL 0.64  0.46  0.38   Sodium 135 - 145 mmol/L 136  137  138   Potassium 3.5 - 5.1 mmol/L 3.7  3.4  3.8   Chloride 98 - 111 mmol/L 106  104  103   CO2 22 - 32 mmol/L 24  21  21    Calcium 8.9 - 10.3 mg/dL 9.0  8.6  8.8   Total Protein 6.5 - 8.1 g/dL 6.6  6.0  6.1   Total Bilirubin 0.3 - 1.2 mg/dL 0.9  0.5  <0.2   Alkaline Phos 38 - 126 U/L 45  147  48   AST 15 - 41 U/L 15  15  16    ALT 0 - 44 U/L 11  15  19     Lipid Panel  No results found for: "CHOL", "TRIG", "HDL", "CHOLHDL", "VLDL", "LDLCALC", "LDLDIRECT"  CBC    Component Value Date/Time   WBC 10.3 09/12/2021 2312   RBC 3.83 (L) 09/12/2021 2312   HGB 12.9 09/12/2021 2312   HGB 10.1 (L) 05/23/2018 0813   HGB 10.6 06/19/2014 0000   HCT 37.3 09/12/2021 2312   HCT 28.4 (L) 05/23/2018 0813   PLT 283 09/12/2021 2312   PLT 276 05/23/2018 0813   MCV 97.4 09/12/2021 2312   MCV 97 05/23/2018 0813   MCH 33.7 09/12/2021 2312   MCHC 34.6 09/12/2021 2312   RDW 11.2 (L) 09/12/2021 2312   RDW 11.6 (L) 05/23/2018 0813   LYMPHSABS 3.8 09/12/2021 2312   LYMPHSABS 2.3 02/28/2018 1545   MONOABS 0.8 09/12/2021 2312   EOSABS 0.2 09/12/2021 2312   EOSABS 0.2 02/28/2018 1545   BASOSABS 0.1 09/12/2021 2312   BASOSABS 0.0 02/28/2018 1545    ASSESSMENT AND PLAN: 1. Incontinence of feces with fecal urgency  - Ambulatory referral to Gastroenterology  2. Irritable bowel syndrome, unspecified type -Encourage patient  to keep a food diary to figure out what foods may cause or contribute to her symptoms.  Advised to use some Imodium when she has diarrhea and on the other hand and use MiraLAX or eat a few prunes when she has constipation. -In regards to the heavy sweating on her buttock related to her bowel movement, I am unsure of the etiology but we will get GIs opinion on this.  3. Severe single episode of major depressive disorder with anxiety [facility]) Patient with psychotic features Strongly recommend that she gets back in with her counselor and psychiatrist through [employer].  She reports she is not able to afford to pay them which she goes to be able to continue.  Advised that she then tries to get in with another mental health provider and she is agreeable to doing this.  I will submit a referral.   -Denies suicidal ideation at this time.  However I recommend that she be seen at [facility] behavioral health crisis center or the ER should she develop these thoughts again. Discussed adding medication to Remeron to try to control some of the anxiety.  She is agreeable to trying BuSpar. -Informed her that I will have our clinical social worker touch base with her as well.  She was agreeable to this. - Ambulatory referral to Psychiatry - busPIRone (BUSPAR) 5 MG tablet; Take 1 tablet (5 mg total) by mouth 2 (two) times daily.  Dispense: 60 tablet; Refill: 1    Patient was given the opportunity to ask questions.  Patient verbalized understanding of the plan and was able to repeat key elements of the plan.   This documentation was completed using Paediatric nurse.  Any transcriptional errors are unintentional.  No orders of the defined types were placed in this encounter.    Requested Prescriptions    No prescriptions requested or ordered in this encounter    No follow-ups on file.  Jonah Blue, MD, FACP

## 2021-12-22 NOTE — Patient Instructions (Signed)
[facility] [address], [place], [place] [address] [phone] or [phone] Walk-in urgent care 24/7 for anyone  For [place] Residents ONLY New patient assessments and therapy walk-ins: Monday and Wednesday 8am-11am First and second Friday 1pm-5pm New patient psychiatry and medication management walk-ins: Mondays, Wednesdays, Thursdays, Fridays 8am-11am No psychiatry walk-ins on Tuesday    

## 2021-12-27 NOTE — Telephone Encounter (Signed)
LCSWA called patient today to introduce herself and to assess patients' mental health needs. Patient did not answer the phone. LCSWA was able to leave a brief message with the patient asking them to return the call. Patient was referred by PCP for mental health provider for medication management.

## 2021-12-28 ENCOUNTER — Telehealth: Payer: Self-pay | Admitting: Internal Medicine

## 2021-12-28 NOTE — Telephone Encounter (Signed)
-----   Message from Dionne Bucy sent at 12/28/2021 12:50 PM EST ----- Regarding: RE: GI referral Sent referral to [facility] [address] [place], [place] [address] PH# [phone]   ----- Message ----- From: Marcine Matar, MD Sent: 12/22/2021  10:20 PM EST To: Dionne Bucy Subject: GI referral                                    Pt prefers to see someone in [place] instead of [place]

## 2022-02-02 ENCOUNTER — Encounter (HOSPITAL_COMMUNITY): Payer: Self-pay | Admitting: Psychiatry

## 2022-02-02 ENCOUNTER — Ambulatory Visit (HOSPITAL_BASED_OUTPATIENT_CLINIC_OR_DEPARTMENT_OTHER): Payer: BC Managed Care – PPO | Admitting: Psychiatry

## 2022-02-02 VITALS — Wt 108.0 lb

## 2022-02-02 DIAGNOSIS — F4310 Post-traumatic stress disorder, unspecified: Secondary | ICD-10-CM | POA: Diagnosis not present

## 2022-02-02 DIAGNOSIS — F1220 Cannabis dependence, uncomplicated: Secondary | ICD-10-CM

## 2022-02-02 DIAGNOSIS — F319 Bipolar disorder, unspecified: Secondary | ICD-10-CM | POA: Diagnosis not present

## 2022-02-02 MED ORDER — QUETIAPINE FUMARATE 50 MG PO TABS: 50.0000 mg | ORAL_TABLET | Freq: Every day | ORAL | 1 refills | Status: DC
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
## 2022-02-14 ENCOUNTER — Telehealth (HOSPITAL_COMMUNITY): Payer: Self-pay | Admitting: Psychiatry

## 2022-02-14 ENCOUNTER — Telehealth (HOSPITAL_COMMUNITY): Payer: Self-pay | Admitting: Professional

## 2022-02-16 ENCOUNTER — Telehealth (HOSPITAL_COMMUNITY): Payer: Self-pay | Admitting: Licensed Clinical Social Worker

## 2022-02-16 ENCOUNTER — Other Ambulatory Visit (HOSPITAL_COMMUNITY): Payer: BC Managed Care – PPO

## 2022-02-16 NOTE — Telephone Encounter (Signed)
[name] called pt to assist with WebEx connection issues. Pt reported that she does not have enough storage in her phone to complete the WebEx download and she has deleted everything she can and is still unable to load WebEx. Pt stated she does not have a computer and is unable to borrow a device from anyone else. Pt became tearful and expressed frustration that she is not able to complete the assessment or join PHP. [name] discussed an in-person PHP, to which pt stated she would be unable to join due to having her son during the day. Pt expressed hopelessness and stated "I really need help." [name] assessed for SI and pt stated "I just want to die." Pt denied plan and intent, stating she would not harm herself because she would not put her children through that. [name] brought up inpatient treatment, to which pt stated "I don't know how I could be away from my kids." [name] discussed outpatient therapy options and pt reported past therapy that was inconsistent. "They told me they couldn't help me because of my trauma." [name] discussed therapy practice in [place], [place], that provides brain spotting and provides virtual sessions through [employer] that does not require an app download. Pt provided verbal consent for this cln to provide referral information to [facility]. [name] informed pt that cln will email pt [facility] contact info in addition to other trauma therapist referrals. Pt also discussed needing short term disability paperwork completed and cln informed pt that Dr. Adele Schilder would have to complete that. Pt expressed appreciation and confirmed safety.

## 2022-02-19 ENCOUNTER — Other Ambulatory Visit: Payer: Self-pay | Admitting: Internal Medicine

## 2022-02-19 DIAGNOSIS — F418 Other specified anxiety disorders: Secondary | ICD-10-CM

## 2022-02-20 NOTE — Telephone Encounter (Signed)
Requested Prescriptions  Pending Prescriptions Disp Refills   busPIRone (BUSPAR) 5 MG tablet [Pharmacy Med Name: BUSPIRONE HCL 5 MG TABLET] 60 tablet 1    Sig: TAKE 1 TABLET BY MOUTH TWICE A DAY     Psychiatry: Anxiolytics/Hypnotics - Non-controlled Passed - 02/19/2022  8:44 AM      Passed - Valid encounter within last 12 months    Recent Outpatient Visits           2 months ago Incontinence of feces with fecal urgency   [place], MD   3 months ago Depressive disorder   [place], MD   5 months ago Encounter to establish care   [place], MD       Future Appointments             In 3 weeks Ladell Pier, MD [place]

## 2022-02-21 ENCOUNTER — Other Ambulatory Visit (HOSPITAL_COMMUNITY): Payer: BC Managed Care – PPO | Attending: Psychiatry | Admitting: Licensed Clinical Social Worker

## 2022-02-21 ENCOUNTER — Encounter (HOSPITAL_COMMUNITY): Payer: Self-pay

## 2022-02-21 DIAGNOSIS — F4310 Post-traumatic stress disorder, unspecified: Secondary | ICD-10-CM | POA: Insufficient documentation

## 2022-02-21 DIAGNOSIS — F411 Generalized anxiety disorder: Secondary | ICD-10-CM
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
## 2022-02-27 ENCOUNTER — Other Ambulatory Visit (HOSPITAL_COMMUNITY): Payer: BC Managed Care – PPO | Attending: Psychiatry

## 2022-02-27 ENCOUNTER — Ambulatory Visit (HOSPITAL_COMMUNITY): Payer: BC Managed Care – PPO | Admitting: Licensed Clinical Social Worker

## 2022-02-27 DIAGNOSIS — R4589 Other symptoms and signs involving emotional state: Secondary | ICD-10-CM

## 2022-02-27 DIAGNOSIS — F4310 Post-traumatic stress disorder, unspecified: Secondary | ICD-10-CM | POA: Diagnosis not present

## 2022-02-27 DIAGNOSIS — F411 Generalized anxiety disorder: Secondary | ICD-10-CM | POA: Diagnosis not present

## 2022-02-27 DIAGNOSIS — F329 Major depressive disorder, single episode, unspecified: Secondary | ICD-10-CM | POA: Insufficient documentation

## 2022-02-27 MED ORDER — QUETIAPINE FUMARATE 100 MG PO TABS: 100.0000 mg | ORAL_TABLET | Freq: Every day | ORAL | 0 refills | Status: DC

## 2022-02-27 NOTE — Psych (Signed)
Virtual Visit via Video Note  I connected with Alexa White on 02/27/22 at  9:00 AM EST by a video enabled telemedicine application and verified that I am speaking with the correct person using two identifiers.  Location: Patient: patient home Provider: clinical home office   I discussed the limitations of evaluation and management by telemedicine and the availability of in person appointments. The patient expressed understanding and agreed to proceed.  I discussed the assessment and treatment plan with the patient. The patient was provided an opportunity to ask questions and all were answered. The patient agreed with the plan and demonstrated an understanding of the instructions.   The patient was advised to call back or seek an in-person evaluation if the symptoms worsen or if the condition fails to improve as anticipated.  Pt was provided 240 minutes of non-face-to-face time during this encounter.   Lorin Glass, LCSW   [facility] PHP THERAPIST PROGRESS NOTE  Alexa White [id number]  Session Time: 9:00 - 10:00  Participation Level: Active  Behavioral Response: CasualAlertDepressed  Type of Therapy: Group Therapy  Treatment Goals addressed: Coping  Progress Towards Goals: Initial  Interventions: CBT, DBT, Supportive, and Reframing  Summary: Alexa White is a 37 y.o. female who presents with depression and trauma symptoms.  Clinician led check-in regarding current stressors and situation, and review of patient completed daily inventory. Clinician utilized active listening and empathetic response and validated patient emotions. Clinician facilitated processing group on pertinent issues.?    Therapist Response: Patient arrived within time allowed. Patient rates her mood at a 3 on a scale of 1-10 with 10 being best. Pt states she feels "not great." Pt states she slept 5 hours and ate 1x. Pt reports her morning's are rough depending on what mood her 37 y/o is in and today was  a hard morning. Pt reports struggles with hopelessness. Patient able to process. Patient engaged in discussion.           Session Time: 10:00 am - 11:00 am   Participation Level: Active   Behavioral Response: CasualAlertDepressed   Type of Therapy: Group Therapy   Treatment Goals addressed: Coping   Progress Towards Goals: Progressing   Interventions: CBT, DBT, Solution Focused, Strength-based, Supportive, and Reframing   Therapist Response: [name] led discussion on "spiraling" or when our thoughts compound on one another to descend our feelings into worse and worse situations. Group members discussed the ways in which spiraling is difficult for them and when they are especially vulnerable. [name] offered DBT distraction skills as a way to halt the spiral once you recognize it.    Therapist Response: Pt engaged in discussion and is able to increase awareness of spiraling throughout the discussion.          Session Time: 11:00 -12:00   Participation Level: Active   Behavioral Response: CasualAlertDepressed   Type of Therapy: Group Therapy   Treatment Goals addressed: Coping   Progress Towards Goals: Progressing   Interventions: CBT, DBT, Solution Focused, Strength-based, Supportive, and Reframing   Summary: [name] led discussion on HALT (hungry, angry, lonely, tired) acronym, which encourages check-in with ourselves when feeling emotional. [name] encouraged pt's to use HALT as a first check-in step to address basic vulnerabilities before reacting. Group members discussed ways in which they react to being angry, lonely, and tired, and work to determine times in which utilizing HALT would have diverted a bigger issue.    Therapist Response: Pt engaged in discussion and reports understanding of HALT  and willingness to utilize it.         Session Time: 12:00 -1:00   Participation Level: Active   Behavioral Response: CasualAlertDepressed   Type of Therapy: Group therapy    Treatment Goals addressed: Coping   Progress Towards Goals: Progressing   Interventions: Supportive; Psychoeducation    Summary: 12:00 - 12:50: Occupational Therapy group led by cln E. Hollan.  12:50 - 1:00 Clinician assessed for immediate needs, medication compliance and efficacy, and safety concerns.   Therapist Response: 12:00 - 12:50: See OT note 12:50 - 1:00 pm: At check-out, patient reports no immediate concerns. Patient demonstrates progress as evidenced by participating in first group session. Patient denies SI/HI/self-harm thoughts at the end of group.    Suicidal/Homicidal: Nowithout intent/plan  Plan: Pt will continue in PHP while working to decrease depression and trauma symptoms, increase emotion regulation, and increase ability to manage symptoms in a healthy manner.   Collaboration of Care: Medication Management AEB A [place]  Patient/Guardian was advised Release of Information must be obtained prior to any record release in order to collaborate their care with an outside provider. Patient/Guardian was advised if they have not already done so to contact the registration department to sign all necessary forms in order for [place] to release information regarding their care.   Consent: Patient/Guardian gives verbal consent for treatment and assignment of benefits for services provided during this visit. Patient/Guardian expressed understanding and agreed to proceed.   Diagnosis: Bipolar I disorder ([place]) [F31.9]    1. Bipolar I disorder ([place])   2. PTSD (post-traumatic stress disorder)   3. Moderate tetrahydrocannabinol (THC) dependence ([place])       Lorin Glass, LCSW 02/27/2022
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
## 2022-02-28 ENCOUNTER — Other Ambulatory Visit (HOSPITAL_COMMUNITY): Payer: BC Managed Care – PPO | Attending: Psychiatry

## 2022-02-28 ENCOUNTER — Encounter (HOSPITAL_COMMUNITY): Payer: Self-pay

## 2022-02-28 ENCOUNTER — Other Ambulatory Visit (HOSPITAL_COMMUNITY): Payer: BC Managed Care – PPO | Attending: Psychiatry | Admitting: Licensed Clinical Social Worker

## 2022-02-28 DIAGNOSIS — F419 Anxiety disorder, unspecified: Secondary | ICD-10-CM | POA: Insufficient documentation

## 2022-02-28 DIAGNOSIS — F411 Generalized anxiety disorder: Secondary | ICD-10-CM | POA: Insufficient documentation

## 2022-02-28 DIAGNOSIS — Z7389 Other problems related to life management difficulty: Secondary | ICD-10-CM | POA: Diagnosis not present

## 2022-02-28 DIAGNOSIS — F32A Depression, unspecified: Secondary | ICD-10-CM | POA: Diagnosis not present

## 2022-02-28 DIAGNOSIS — R4589 Other symptoms and signs involving emotional state: Secondary | ICD-10-CM | POA: Diagnosis not present

## 2022-02-28 DIAGNOSIS — F4310 Post-traumatic stress disorder, unspecified: Secondary | ICD-10-CM | POA: Diagnosis not present

## 2022-02-28 DIAGNOSIS — F319 Bipolar disorder, unspecified: Secondary | ICD-10-CM

## 2022-02-28 NOTE — Therapy (Signed)
[place], [place], [address] Phone: [phone]   Fax:  [phone]  Occupational Therapy Treatment Virtual Visit via Video Note  I connected with Alexa White on 02/28/22 at  8:00 AM EST by a video enabled telemedicine application and verified that I am speaking with the correct person using two identifiers.  Location: Patient: home Provider: office   I discussed the limitations of evaluation and management by telemedicine and the availability of in person appointments. The patient expressed understanding and agreed to proceed.    The patient was advised to call back or seek an in-person evaluation if the symptoms worsen or if the condition fails to improve as anticipated.  I provided 55 minutes of non-face-to-face time during this encounter.   Patient Details  Name: Alexa White MRN: [id number] Date of Birth: 30-Jan-1986 No data recorded  Encounter Date: 02/28/2022   OT End of Session - 02/28/22 2236     Visit Number 2    Number of Visits 20    Date for OT Re-Evaluation 03/30/22    OT Start Time 1200    OT Stop Time 1255    OT Time Calculation (min) 55 min             Past Medical History:  Diagnosis Date   Anemia    Anxiety    Depression    PTSD   Former smoker    Quit- 06/01/14   Gestational diabetes    Marijuana use 2016   Positive x4 prenatally    Past Surgical History:  Procedure Laterality Date   nose repair     broken nose- had to be reset   TUBAL LIGATION N/A 07/26/2018   Procedure: POST PARTUM TUBAL LIGATION;  Surgeon: Guss Bunde, MD;  Location: [facility] LD [facility]S;  Service: Gynecology;  Laterality: N/A;   WISDOM TOOTH EXTRACTION  2011    There were no vitals filed for this visit.   Subjective Assessment - 02/28/22 2235     Currently in Pain? No/denies    Pain Score 0-No pain               Group Session:  S: Doing some bit better today  O: The  session centered on the neuroscientific underpinnings of motivation and its inverse relationship with procrastination. Through a review of current scientific literature, the group examined:  The function of dopamine as a critical neurotransmitter in the motivation circuitry of the brain. The physiological and psychological aspects of procrastination, including the impact of dopamine on the tendency to delay tasks. Strategies for leveraging an understanding of dopamine's role to enhance motivation, address procrastination, and foster productive habits. The objective was to provide a framework for participants to understand the neurochemical dynamics of motivation, relate these to personal experiences of procrastination, and apply evidence-based methods to improve focus and drive in both professional and personal contexts.   A: The patient demonstrates a strong grasp of the material presented on the neurobiology of motivation and its implications for procrastination. They actively participated in discussions, showing an ability to connect scientific concepts to personal experiences. The patient exhibits insight into their own behavior patterns and expresses a keen interest in applying the strategies discussed to mitigate tendencies towards procrastination. The patient's engagement level indicates a readiness to implement behavioral changes that may enhance personal productivity and reduce procrastination.    P: Continue to attend PHP OT group sessions 5x week for 4 weeks to  promote daily structure, social engagement, and opportunities to develop and utilize adaptive strategies to maximize functional performance in preparation for safe transition and integration back into school, work, and the community. Plan to address topic of tbd in next OT group session.                    OT Education - 02/28/22 2236     Education Details Procrastination              OT Short Term Goals -  02/28/22 1944       OT SHORT TERM GOAL #1   Title Client will develop and utilize a personalized coping toolbox containing at least five coping strategies to manage challenging situations, demonstrating their use in real-life scenarios by the end of therapy.    Time 4    Period Weeks    Status On-going    Target Date 03/30/22      OT SHORT TERM GOAL #2   Title By discharge, client will demonstrate the ability to adapt and modify routines when faced with unexpected events or changes, maintaining a balanced approach to daily tasks.      OT SHORT TERM GOAL #3   Title By the time of discharge, client will independently set, track, and make progress towards a long-term goal, demonstrating resilience in overcoming obstacles and seeking support when needed.                      Plan - 02/28/22 2236     Psychosocial Skills Coping Strategies;Habits;Interpersonal Interaction;Routines and Behaviors             Patient will benefit from skilled therapeutic intervention in order to improve the following deficits and impairments:       Psychosocial Skills: Coping Strategies, Habits, Interpersonal Interaction, Routines and Behaviors   Visit Diagnosis: Difficulty coping    Problem List Patient Active Problem List   Diagnosis Date Noted   PTSD (post-traumatic stress disorder) 02/21/2022   Alcohol use disorder 11/22/2021   GAD (generalized anxiety disorder) 09/20/2021   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[place]) 09/20/2021   Unintended weight loss 09/20/2021   DUB (dysfunctional uterine bleeding) 09/20/2021   Request for sterilization 07/27/2018   Delivery of viable fetus in abdominal pregnancy 07/26/2018   Normal labor and delivery 07/26/2018   GDM (gestational diabetes mellitus) 05/28/2018   Anxiety and depression 05/23/2018   Supervision of high-risk pregnancy, third trimester 02/20/2018    Brantley Stage, OT 02/28/2022, 10:37 PM  [facility], [place] PRO[facility] [place], [place], [address] Phone: [phone]   Fax:  [phone]  Name: Alexa White MRN: [id number] Date of Birth: [birth year]

## 2022-02-28 NOTE — Therapy (Signed)
[facility] PARTIAL HOSPITALIZATION PROGRAM [address] [place], [place], [address] Phone: [phone]   Fax:  [phone]  Occupational Therapy Evaluation Virtual Visit via Video Note  I connected with Alexa White on 02/28/22 at  8:00 AM EST by a video enabled telemedicine application and verified that I am speaking with the correct person using two identifiers.  Location: Patient: home Provider: ofice   I discussed the limitations of evaluation and management by telemedicine and the availability of in person appointments. The patient expressed understanding and agreed to proceed.    The patient was advised to call back or seek an in-person evaluation if the symptoms worsen or if the condition fails to improve as anticipated.  I provided 85 minutes of non-face-to-face time during this encounter.   Patient Details  Name: Alexa White MRN: [id number] Date of Birth: 1985-12-22 No data recorded  Encounter Date: 02/27/2022   OT End of Session - 02/28/22 1935     Visit Number 1    Number of Visits 20    Date for OT Re-Evaluation 03/30/22    OT Start Time 0930    OT Stop Time 1255   Eval: 30; Tx: 55; Total: 85   OT Time Calculation (min) 205 min    Activity Tolerance Patient tolerated treatment well    Behavior During Therapy WFL for tasks assessed/performed             Past Medical History:  Diagnosis Date   Anemia    Anxiety    Depression    PTSD   Former smoker    Quit- 06/01/14   Gestational diabetes    Marijuana use 2016   Positive x4 prenatally    Past Surgical History:  Procedure Laterality Date   nose repair     broken nose- had to be reset   TUBAL LIGATION N/A 07/26/2018   Procedure: POST PARTUM TUBAL LIGATION;  Surgeon: Lesly Dukes, MD;  Location: [facility] LD ORS;  Service: Gynecology;  Laterality: N/A;   WISDOM TOOTH EXTRACTION  2011    There were no vitals filed for this visit.   Subjective Assessment - 02/28/22  1928     Subjective  "I have trouble with communication in a lot of situations and routine or reaching goals"    Pertinent History GAD, MMD, PTSD    Patient Stated Goals communication, coping skills, routines    Currently in Pain? No/denies    Pain Score 0-No pain    Multiple Pain Sites No                OT Assessment  Diagnosis: MDD. PTSD, GAD Past medical history/referral information: MDD / GAD Living situation: w/ husband / children / [facility] ADLs: independent Work: Environmental health practitioner / Retail Leisure: inhibited Social support:  fair Struggles: coping / communication / habits and goal setting and adherence OT goal:  Improve the psychosocial domains that are in deficit to the extent that they inhibit her daily occupational performance  OCAIRS Mental Health Interview Summary of Client Scores:  Facilitates participation in occupation Allows participation in occupation Inhibits participation in occupation Restricts participation in occupation Comments:  Roles    X   Habits   X    Personal Causation    X   Values   X    Interests   X    Skills   X    Short-Term Goals   X    Long-term Goals  X   Interpretation of Past Experiences   X    Physical Environment  X     Social Environment   X    Readiness for Change   X      Need for Occupational Therapy:  4 Shows positive occupational participation, no need for OT.   3 Need for minimal intervention/consultative participation   2 Need for OT intervention indicated to restore/improve participation   1 Need for extensive OT intervention indicated to improve participation.  Referral for follow up services also recommended.   Assessment:  Patient demonstrates behavior that INHIBIT participation in occupation.  Patient will benefit from occupational therapy intervention in order to improve time management, financial management, stress management, job readiness skills, social skills, and health management skills in preparation to  return to full time community living and to be a productive community member.    Plan:  Patient will participate in skilled occupational therapy sessions individually or in a group setting to improve coping skills, psychosocial skills, and emotional skills required to return to prior level of function. Treatment will be 4-5 times per week for 4 weeks.     Group Session:   O: The primary objective of this group therapy session is to equip participants with practical strategies and coping mechanisms to effectively manage accumulated tasks that seem insurmountable due to mental health challenges such as depression and anxiety. The group aims to build a supportive environment wherein individuals can openly discuss their struggles, share experiences, and learn from one another. The session's strategies will encompass goal setting, time management, task breakdown, creating conducive environments, and mindfulness practices. We aim to help participants perceive tasks as manageable units rather than overwhelming piles and encourage an approach of progress over perfection.   A:  Despite being more reserved during the group session, Patient was present and attentive. They listened actively to the discussions, and although they didn't engage as much during the session, their input during the closing reflections indicated comprehension and interest. Patient identified "mindfulness and self-care" as particularly beneficial strategies and showed interest in the practice of keeping a gratitude journal. Even though they took a more observational role, their thoughtful reflections and intention to incorporate the learnt strategies demonstrate that they also significantly benefited from the therapy session.   P: Continue to attend PHP OT group sessions 5x week for 4 weeks to promote daily structure, social engagement, and opportunities to develop and utilize adaptive strategies to maximize functional performance in  preparation for safe transition and integration back into school, work, and the community. Plan to address topic of tbd in next OT group session.                  OT Education - 02/28/22 1930     Education Details OCAIRS / Task Accumulation    Person(s) Educated Patient    Methods Handout;Explanation    Comprehension Verbalized understanding              OT Short Term Goals - 02/28/22 1944       OT SHORT TERM GOAL #1   Title Client will develop and utilize a personalized coping toolbox containing at least five coping strategies to manage challenging situations, demonstrating their use in real-life scenarios by the end of therapy.    Time 4    Period Weeks    Status On-going    Target Date 03/30/22      OT SHORT TERM GOAL #2   Title By discharge,  client will demonstrate the ability to adapt and modify routines when faced with unexpected events or changes, maintaining a balanced approach to daily tasks.      OT SHORT TERM GOAL #3   Title By the time of discharge, client will independently set, track, and make progress towards a long-term goal, demonstrating resilience in overcoming obstacles and seeking support when needed.                      Plan - 02/28/22 1936     Clinical Impression Statement Pt presents w/ deficits in multiple psychosocial doamins that directly inhibits various aspects of social participation and family relationships    OT Occupational Profile and History Problem Focused Assessment - Including review of records relating to presenting problem    Occupational performance deficits (Please refer to evaluation for details): IADL's;Leisure;Rest and Sleep;Social Participation;Work    Therapist, nutritional;Habits;Interpersonal Interaction;Routines and Behaviors    Rehab Potential Good    Clinical Decision Making Limited treatment options, no task modification necessary    Comorbidities Affecting Occupational  Performance: None    Modification or Assistance to Complete Evaluation  No modification of tasks or assist necessary to complete eval    OT Frequency 5x / week    OT Duration 4 weeks    OT Treatment/Interventions Psychosocial skills training;Coping strategies training    Consulted and Agree with Plan of Care Patient             Patient will benefit from skilled therapeutic intervention in order to improve the following deficits and impairments:       Psychosocial Skills: Coping Strategies, Habits, Interpersonal Interaction, Routines and Behaviors   Visit Diagnosis: Difficulty coping    Problem List Patient Active Problem List   Diagnosis Date Noted   PTSD (post-traumatic stress disorder) 02/21/2022   Alcohol use disorder 11/22/2021   GAD (generalized anxiety disorder) 09/20/2021   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[place]) 09/20/2021   Unintended weight loss 09/20/2021   DUB (dysfunctional uterine bleeding) 09/20/2021   Request for sterilization 07/27/2018   Delivery of viable fetus in abdominal pregnancy 07/26/2018   Normal labor and delivery 07/26/2018   GDM (gestational diabetes mellitus) 05/28/2018   Anxiety and depression 05/23/2018   Supervision of high-risk pregnancy, third trimester 02/20/2018    Brantley Stage, OT 02/28/2022, 7:49 PM  [facility], [address] [place], [place], [address] Phone: [phone]   Fax:  [phone]  Name: Alexa White MRN: [id number] Date of Birth: 11/13/1985

## 2022-03-01 ENCOUNTER — Other Ambulatory Visit (HOSPITAL_COMMUNITY): Payer: BC Managed Care – PPO | Attending: Psychiatry | Admitting: Licensed Clinical Social Worker

## 2022-03-01 ENCOUNTER — Other Ambulatory Visit (HOSPITAL_COMMUNITY): Payer: BC Managed Care – PPO | Attending: Psychiatry

## 2022-03-01 DIAGNOSIS — Z7389 Other problems related to life management difficulty: Secondary | ICD-10-CM | POA: Diagnosis not present

## 2022-03-01 DIAGNOSIS — F319 Bipolar disorder, unspecified: Secondary | ICD-10-CM | POA: Diagnosis not present

## 2022-03-01 DIAGNOSIS — F4310 Post-traumatic stress disorder, unspecified: Secondary | ICD-10-CM | POA: Diagnosis not present

## 2022-03-01 DIAGNOSIS — F411 Generalized anxiety disorder: Secondary | ICD-10-CM | POA: Insufficient documentation

## 2022-03-01 DIAGNOSIS — R4589 Other symptoms and signs involving emotional state: Secondary | ICD-10-CM

## 2022-03-01 NOTE — Psych (Signed)
Virtual Visit via Video Note  I connected with Alexa White on 02/28/22 at  9:00 AM EST by a video enabled telemedicine application and verified that I am speaking with the correct person using two identifiers.  Location: Patient: patient home Provider: clinical home office   I discussed the limitations of evaluation and management by telemedicine and the availability of in person appointments. The patient expressed understanding and agreed to proceed.  I discussed the assessment and treatment plan with the patient. The patient was provided an opportunity to ask questions and all were answered. The patient agreed with the plan and demonstrated an understanding of the instructions.   The patient was advised to call back or seek an in-person evaluation if the symptoms worsen or if the condition fails to improve as anticipated.  Pt was provided 240 minutes of non-face-to-face time during this encounter.   Lorin Glass, LCSW   [facility] PHP THERAPIST PROGRESS NOTE  Alexa White [id number]  Session Time: 9:00 - 10:00  Participation Level: Active  Behavioral Response: CasualAlertDepressed  Type of Therapy: Group Therapy  Treatment Goals addressed: Coping  Progress Towards Goals: Initial  Interventions: CBT, DBT, Supportive, and Reframing  Summary: Alexa White is a 37 y.o. female who presents with depression and trauma symptoms.  Clinician led check-in regarding current stressors and situation, and review of patient completed daily inventory. Clinician utilized active listening and empathetic response and validated patient emotions. Clinician facilitated processing group on pertinent issues.?    Therapist Response: Patient arrived within time allowed. Patient rates her mood at a 7 on a scale of 1-10 with 10 being best. Pt states she feels "okay." Pt states she slept 4.5 hours and ate 2x. Pt reports she had a peaceful morning which has helped her mood. Pt states she had a  difficult conversation with her husband yesterday and thinks some progress was made. Pt states feeling she is surrounded by negativity. Pt identifies hopelessness.  Patient able to process. Patient engaged in discussion.           Session Time: 10:00 am - 11:00 am   Participation Level: Active   Behavioral Response: CasualAlertDepressed   Type of Therapy: Group Therapy   Treatment Goals addressed: Coping   Progress Towards Goals: Progressing   Interventions: CBT, DBT, Solution Focused, Strength-based, Supportive, and Reframing   Therapist Response: [name] led discussion on how/when to share sensitive topics with others. [name] discussed the connection between trust and appropriate sharing and how to determine what is appropriate to share.    Therapist Response: Pt engaged in discussion and reports gaining insight.         Session Time: 11:00 -12:00   Participation Level: Active   Behavioral Response: CasualAlertDepressed   Type of Therapy: Group Therapy   Treatment Goals addressed: Coping   Progress Towards Goals: Progressing   Interventions: CBT, DBT, Solution Focused, Strength-based, Supportive, and Reframing   Summary: [name] introduced topic of boundaries. [name] discussed how boundaries inform our relationships and affect self-esteem and personal agency. Group discussed the three types of boundaries: rigid, porous, and healthy and when each type is most helpful/harmful.    Therapist Response: Pt engaged in discussion and reports having mostly porous boundaries.         Session Time: 12:00 -1:00   Participation Level: Active   Behavioral Response: CasualAlertDepressed   Type of Therapy: Group therapy   Treatment Goals addressed: Coping   Progress Towards Goals: Progressing   Interventions: Supportive; Psychoeducation  Summary: 12:00 - 12:50: Occupational Therapy group led by cln E. Hollan.  12:50 - 1:00 Clinician assessed for immediate needs, medication  compliance and efficacy, and safety concerns.   Therapist Response: 12:00 - 12:50: See OT note 12:50 - 1:00 pm: At check-out, patient reports no immediate concerns. Patient demonstrates progress as evidenced by continued engagement in group and responsiveness to treatment. Patient denies SI/HI/self-harm thoughts at the end of group.    Suicidal/Homicidal: Nowithout intent/plan  Plan: Pt will continue in PHP while working to decrease depression and trauma symptoms, increase emotion regulation, and increase ability to manage symptoms in a healthy manner.   Collaboration of Care: Medication Management AEB A Pashayan  Patient/Guardian was advised Release of Information must be obtained prior to any record release in order to collaborate their care with an outside provider. Patient/Guardian was advised if they have not already done so to contact the registration department to sign all necessary forms in order for [place] to release information regarding their care.   Consent: Patient/Guardian gives verbal consent for treatment and assignment of benefits for services provided during this visit. Patient/Guardian expressed understanding and agreed to proceed.   Diagnosis: Bipolar I disorder ([place]) [F31.9]    1. Bipolar I disorder ([place])   2. PTSD (post-traumatic stress disorder)   3. GAD (generalized anxiety disorder)       Lorin Glass, LCSW 03/01/2022

## 2022-03-02 ENCOUNTER — Other Ambulatory Visit (HOSPITAL_COMMUNITY): Payer: BC Managed Care – PPO | Attending: Psychiatry

## 2022-03-02 ENCOUNTER — Other Ambulatory Visit (HOSPITAL_COMMUNITY): Payer: BC Managed Care – PPO | Attending: Psychiatry | Admitting: Licensed Clinical Social Worker

## 2022-03-02 ENCOUNTER — Telehealth (HOSPITAL_COMMUNITY): Payer: BC Managed Care – PPO | Admitting: Psychiatry

## 2022-03-02 DIAGNOSIS — F319 Bipolar disorder, unspecified: Secondary | ICD-10-CM | POA: Insufficient documentation

## 2022-03-02 DIAGNOSIS — F4310 Post-traumatic stress disorder, unspecified: Secondary | ICD-10-CM | POA: Insufficient documentation

## 2022-03-02 DIAGNOSIS — R4589 Other symptoms and signs involving emotional state: Secondary | ICD-10-CM | POA: Diagnosis not present

## 2022-03-02 DIAGNOSIS — F411 Generalized anxiety disorder: Secondary | ICD-10-CM | POA: Diagnosis not present

## 2022-03-02 NOTE — Psych (Signed)
Virtual Visit via Video Note  I connected with Alexa White on 03/01/22 at  9:00 AM EST by a video enabled telemedicine application and verified that I am speaking with the correct person using two identifiers.  Location: Patient: patient home Provider: clinical home office   I discussed the limitations of evaluation and management by telemedicine and the availability of in person appointments. The patient expressed understanding and agreed to proceed.  I discussed the assessment and treatment plan with the patient. The patient was provided an opportunity to ask questions and all were answered. The patient agreed with the plan and demonstrated an understanding of the instructions.   The patient was advised to call back or seek an in-person evaluation if the symptoms worsen or if the condition fails to improve as anticipated.  Pt was provided 240 minutes of non-face-to-face time during this encounter.   Lorin Glass, LCSW   [facility] PHP THERAPIST PROGRESS NOTE  Alexa White [id number]  Session Time: 9:00 - 10:00  Participation Level: Active  Behavioral Response: CasualAlertDepressed  Type of Therapy: Group Therapy  Treatment Goals addressed: Coping  Progress Towards Goals: Initial  Interventions: CBT, DBT, Supportive, and Reframing  Summary: Alexa White is a 37 y.o. female who presents with depression and trauma symptoms.  Clinician led check-in regarding current stressors and situation, and review of patient completed daily inventory. Clinician utilized active listening and empathetic response and validated patient emotions. Clinician facilitated processing group on pertinent issues.?    Therapist Response: Patient arrived within time allowed. Patient rates her mood at a 7 on a scale of 1-10 with 10 being best. Pt states she feels "okay." Pt states she slept 5 hours and ate 2x. Pt reports she spent time with her older son yesterday and it went well. Pt shares spending  time with her children is a protective factor and bright spot. Pt identifies continued hopelessness.  Patient able to process. Patient engaged in discussion.           Session Time: 10:00 am - 11:00 am   Participation Level: Active   Behavioral Response: CasualAlertDepressed   Type of Therapy: Group Therapy   Treatment Goals addressed: Coping   Progress Towards Goals: Progressing   Interventions: Medication group, supportive, psychoeducation   Therapist Response: Pharmacist, Dickie La led medication group and discussed the role of psychological medications in mental health treatment, how they can effect the brain and body, common side effects, and concerns to watch out for. Group was given a chance to ask and have question answered regarding their own medication journeys.    Therapist Response: Pt engaged in discussion.          Session Time: 11:00 -12:00   Participation Level: Active   Behavioral Response: CasualAlertDepressed   Type of Therapy: Group Therapy   Treatment Goals addressed: Coping   Progress Towards Goals: Progressing   Interventions: Wellness, supportive, psychoeducation   Summary: Alexa White of Wellness, J Athas led wellness group to discuss how wellness topics of sleep, nutrition, and exercise can support our mental health and how to incorporate positive changes into our day.    Therapist Response: Pt engaged in discussion and is able to identify a positive change to add to their daily schedule.         Session Time: 12:00 -1:00   Participation Level: Active   Behavioral Response: CasualAlertDepressed   Type of Therapy: Group therapy   Treatment Goals addressed: Coping   Progress Towards Goals: Progressing  Interventions: Supportive; Psychoeducation    Summary: 12:00 - 12:50: Occupational Therapy group led by cln E. Hollan.  12:50 - 1:00 Clinician assessed for immediate needs, medication compliance and efficacy, and safety concerns.    Therapist Response: 12:00 - 12:50: See OT note 12:50 - 1:00 pm: At check-out, patient reports no immediate concerns. Patient demonstrates progress as evidenced by continued engagement in group and responsiveness to treatment. Patient denies SI/HI/self-harm thoughts at the end of group.    Suicidal/Homicidal: Nowithout intent/plan  Plan: Pt will continue in PHP while working to decrease depression and trauma symptoms, increase emotion regulation, and increase ability to manage symptoms in a healthy manner.   Collaboration of Care: Medication Management AEB A Pashayan  Patient/Guardian was advised Release of Information must be obtained prior to any record release in order to collaborate their care with an outside provider. Patient/Guardian was advised if they have not already done so to contact the registration department to sign all necessary forms in order for [place] to release information regarding their care.   Consent: Patient/Guardian gives verbal consent for treatment and assignment of benefits for services provided during this visit. Patient/Guardian expressed understanding and agreed to proceed.   Diagnosis: Bipolar I disorder ([place]) [F31.9]    1. Bipolar I disorder ([place])   2. PTSD (post-traumatic stress disorder)   3. GAD (generalized anxiety disorder)       Lorin Glass, LCSW 03/02/2022

## 2022-03-03 ENCOUNTER — Encounter (HOSPITAL_COMMUNITY): Payer: Self-pay

## 2022-03-03 ENCOUNTER — Other Ambulatory Visit (HOSPITAL_COMMUNITY): Payer: BC Managed Care – PPO | Attending: Psychiatry | Admitting: Licensed Clinical Social Worker

## 2022-03-03 ENCOUNTER — Other Ambulatory Visit (HOSPITAL_COMMUNITY): Payer: BC Managed Care – PPO | Attending: Psychiatry

## 2022-03-03 DIAGNOSIS — F319 Bipolar disorder, unspecified: Secondary | ICD-10-CM | POA: Insufficient documentation

## 2022-03-03 DIAGNOSIS — F411 Generalized anxiety disorder: Secondary | ICD-10-CM | POA: Diagnosis not present

## 2022-03-03 DIAGNOSIS — F4310 Post-traumatic stress disorder, unspecified: Secondary | ICD-10-CM | POA: Insufficient documentation

## 2022-03-03 DIAGNOSIS — R4589 Other symptoms and signs involving emotional state: Secondary | ICD-10-CM | POA: Diagnosis not present

## 2022-03-03 NOTE — Progress Notes (Signed)
Spoke with patient via WebEx video call, used 2 identifiers to correctly identify patient. States this is her first time in PHP. Her PCP referred her to Dr. Adele Schilder who recommended the program. Her depression/anxiety became overwhelming. Has had a decrease in her weight and sleep. Has been picking at her face creating sores. Feels paranoid that people at her work are talking about her and feeling like people follow her. Has passive SI with no plan or intent just curious about it. Has seen shadows but denies auditory hallucinations. Has panic and anxiety in public. On scale 1-10 as 10 being worst she rates depression at 8 and anxiety at 7. PHQ9=19. No issues or complaints. No side effects from medications.

## 2022-03-03 NOTE — Therapy (Signed)
[facility] [place], [place], [address] Phone: [phone]   Fax:  [phone]  Occupational Therapy Treatment Virtual Visit via Video Note  I connected with Alexa White on 03/03/22 at  8:00 AM EST by a video enabled telemedicine application and verified that I am speaking with the correct person using two identifiers.  Location: Patient: home Provider: office   I discussed the limitations of evaluation and management by telemedicine and the availability of in person appointments. The patient expressed understanding and agreed to proceed.    The patient was advised to call back or seek an in-person evaluation if the symptoms worsen or if the condition fails to improve as anticipated.  I provided 55 minutes of non-face-to-face time during this encounter.   Patient Details  Name: Alexa White MRN: [id number] Date of Birth: May 19, 1985 No data recorded  Encounter Date: 03/01/2022   OT End of Session - 03/03/22 0652     Visit Number 3    Number of Visits 20    Date for OT Re-Evaluation 03/30/22    OT Start Time 1200    OT Stop Time 1255    OT Time Calculation (min) 55 min             Past Medical History:  Diagnosis Date   Anemia    Anxiety    Depression    PTSD   Former smoker    Quit- 06/01/14   Gestational diabetes    Marijuana use 2016   Positive x4 prenatally    Past Surgical History:  Procedure Laterality Date   nose repair     broken nose- had to be reset   TUBAL LIGATION N/A 07/26/2018   Procedure: POST PARTUM TUBAL LIGATION;  Surgeon: Guss Bunde, MD;  Location: [facility] LD [facility];  Service: Gynecology;  Laterality: N/A;   WISDOM TOOTH EXTRACTION  2011    There were no vitals filed for this visit.   Subjective Assessment - 03/03/22 0648     Currently in Pain? No/denies    Pain Score 0-No pain                Group Session:  S: doing better today I think  O: The  session centered on the neuroscientific underpinnings of motivation and its inverse relationship with procrastination. Through a review of current scientific literature, the group examined:  The function of dopamine as a critical neurotransmitter in the motivation circuitry of the brain. The physiological and psychological aspects of procrastination, including the impact of dopamine on the tendency to delay tasks. Strategies for leveraging an understanding of dopamine's role to enhance motivation, address procrastination, and foster productive habits. The objective was to provide a framework for participants to understand the neurochemical dynamics of motivation, relate these to personal experiences of procrastination, and apply evidence-based methods to improve focus and drive in both professional and personal contexts.   A:  The patient attended the session and was present throughout the discussion on the neuroscientific aspects of motivation and procrastination. They displayed passive engagement, listening to the material and occasionally participating in the dialogue. The patient shows a foundational understanding of the concepts but may require additional encouragement to actively apply the strategies to their own habits and routines. Their current level of passive engagement suggests they are processing the information, and with further support, may begin to integrate these insights into actionable changes.   P: Continue to attend PHP OT group sessions  5x week for 4 weeks to promote daily structure, social engagement, and opportunities to develop and utilize adaptive strategies to maximize functional performance in preparation for safe transition and integration back into school, work, and the community. Plan to address topic of Routines in next OT group session.                   OT Education - 03/03/22 0652     Education Details Procrastination 2              OT Short  Term Goals - 02/28/22 1944       OT SHORT TERM GOAL #1   Title Client will develop and utilize a personalized coping toolbox containing at least five coping strategies to manage challenging situations, demonstrating their use in real-life scenarios by the end of therapy.    Time 4    Period Weeks    Status On-going    Target Date 03/30/22      OT SHORT TERM GOAL #2   Title By discharge, client will demonstrate the ability to adapt and modify routines when faced with unexpected events or changes, maintaining a balanced approach to daily tasks.      OT SHORT TERM GOAL #3   Title By the time of discharge, client will independently set, track, and make progress towards a long-term goal, demonstrating resilience in overcoming obstacles and seeking support when needed.                      Plan - 03/03/22 6160     Psychosocial Skills Coping Strategies;Habits;Interpersonal Interaction;Routines and Behaviors             Patient will benefit from skilled therapeutic intervention in order to improve the following deficits and impairments:       Psychosocial Skills: Coping Strategies, Habits, Interpersonal Interaction, Routines and Behaviors   Visit Diagnosis: Difficulty coping    Problem List Patient Active Problem List   Diagnosis Date Noted   PTSD (post-traumatic stress disorder) 02/21/2022   Alcohol use disorder 11/22/2021   GAD (generalized anxiety disorder) 09/20/2021   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[place]) 09/20/2021   Unintended weight loss 09/20/2021   DUB (dysfunctional uterine bleeding) 09/20/2021   Request for sterilization 07/27/2018   Delivery of viable fetus in abdominal pregnancy 07/26/2018   Normal labor and delivery 07/26/2018   GDM (gestational diabetes mellitus) 05/28/2018   Anxiety and depression 05/23/2018   Supervision of high-risk pregnancy, third trimester 02/20/2018    Brantley Stage, OT 03/03/2022, 6:53 AM  [facility], [place], [place], [address] Phone: [phone]   Fax:  [phone]  Name: Alexa White MRN: [id number] Date of Birth: March 18, 1985

## 2022-03-03 NOTE — Therapy (Signed)
[name], [place], [address] Phone: [phone]   Fax:  [phone]  Occupational Therapy Treatment Virtual Visit via Video Note  I connected with Alexa White on 03/03/22 at  8:00 AM EST by a video enabled telemedicine application and verified that I am speaking with the correct person using two identifiers.  Location: Patient: home Provider: office   I discussed the limitations of evaluation and management by telemedicine and the availability of in person appointments. The patient expressed understanding and agreed to proceed.    The patient was advised to call back or seek an in-person evaluation if the symptoms worsen or if the condition fails to improve as anticipated.  I provided 55 minutes of non-face-to-face time during this encounter.  Patient Details  Name: Alexa White MRN: [id number] Date of Birth: Nov 23, 1985 No data recorded  Encounter Date: 03/02/2022   OT End of Session - 03/03/22 0549     Visit Number 3    Number of Visits 20    Date for OT Re-Evaluation 03/30/22    OT Start Time 1200    OT Stop Time 1255    OT Time Calculation (min) 55 min             Past Medical History:  Diagnosis Date   Anemia    Anxiety    Depression    PTSD   Former smoker    Quit- 06/01/14   Gestational diabetes    Marijuana use 2016   Positive x4 prenatally    Past Surgical History:  Procedure Laterality Date   nose repair     broken nose- had to be reset   TUBAL LIGATION N/A 07/26/2018   Procedure: POST PARTUM TUBAL LIGATION;  Surgeon: Guss Bunde, MD;  Location: [facility] LD [facility];  Service: Gynecology;  Laterality: N/A;   WISDOM TOOTH EXTRACTION  2011    There were no vitals filed for this visit.   Subjective Assessment - 03/03/22 0547     Currently in Pain? No/denies    Pain Score 0-No pain                 Group Session:  S: Doing better today  O: The  objective of the telehealth group therapy session was to discuss the significance of routines in promoting mental health and wellbeing. The OT aimed to explore the power of routines in providing structure, stability, and predictability in individuals' lives, as well as their role in establishing healthy habits, reducing stress and anxiety, managing time effectively, achieving goals, and fostering a sense of community and social connectedness.   Participants were guided to identify areas where routines could improve their mental health and were provided with tips for establishing and maintaining healthy routines. The session concluded by emphasizing the potential of routines to enhance overall quality of life.  Homework Assignment:  As part of the session, participants were assigned a homework task to reflect on their current routines and select one area of their lives where they could establish a new routine to improve their mental health and wellbeing. They were instructed to start small and implement the routine gradually, while seeking support from friends, family, or mental health professionals as needed. The participants were asked to report their progress in the next therapy session, focusing on the benefits and challenges encountered during the implementation of their chosen routine.   A: During the telehealth group therapy session, the patient actively participated in  the discussion on the importance of routines in promoting mental health and wellbeing. The patient demonstrated a good understanding of the power of routines in providing structure, stability, and predictability in their life. They were able to identify areas in their life where routines could contribute to improving their overall mental health.  The patient showed motivation and willingness to reflect on their current habits and behaviors, recognizing the need for positive changes. They actively engaged in the session, sharing  personal experiences and challenges related to establishing and maintaining healthy routines. The patient expressed a desire to reduce stress and anxiety, manage their time more effectively, and achieve their goals through the implementation of routines.  P: Continue to attend PHP OT group sessions 5x week for 4 weeks to promote daily structure, social engagement, and opportunities to develop and utilize adaptive strategies to maximize functional performance in preparation for safe transition and integration back into school, work, and the community. Plan to address topic of pt 2 in next OT group session.                  OT Education - 03/03/22 0548     Education Details The Power of Routines 1              OT Short Term Goals - 02/28/22 1944       OT SHORT TERM GOAL #1   Title Client will develop and utilize a personalized coping toolbox containing at least five coping strategies to manage challenging situations, demonstrating their use in real-life scenarios by the end of therapy.    Time 4    Period Weeks    Status On-going    Target Date 03/30/22      OT SHORT TERM GOAL #2   Title By discharge, client will demonstrate the ability to adapt and modify routines when faced with unexpected events or changes, maintaining a balanced approach to daily tasks.      OT SHORT TERM GOAL #3   Title By the time of discharge, client will independently set, track, and make progress towards a long-term goal, demonstrating resilience in overcoming obstacles and seeking support when needed.                      Plan - 03/03/22 0549     Psychosocial Skills Coping Strategies;Habits;Interpersonal Interaction;Routines and Behaviors             Patient will benefit from skilled therapeutic intervention in order to improve the following deficits and impairments:       Psychosocial Skills: Coping Strategies, Habits, Interpersonal Interaction, Routines and  Behaviors   Visit Diagnosis: Difficulty coping    Problem List Patient Active Problem List   Diagnosis Date Noted   PTSD (post-traumatic stress disorder) 02/21/2022   Alcohol use disorder 11/22/2021   GAD (generalized anxiety disorder) 09/20/2021   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[place]) 09/20/2021   Unintended weight loss 09/20/2021   DUB (dysfunctional uterine bleeding) 09/20/2021   Request for sterilization 07/27/2018   Delivery of viable fetus in abdominal pregnancy 07/26/2018   Normal labor and delivery 07/26/2018   GDM (gestational diabetes mellitus) 05/28/2018   Anxiety and depression 05/23/2018   Supervision of high-risk pregnancy, third trimester 02/20/2018    Brantley Stage, OT 03/03/2022, 5:50 AM  [facility], [place], [place], [address] Phone: [phone]   Fax:  [phone]  Name: Elsi Stelzer  MRN: [id number] Date of Birth: 06-14-1985

## 2022-03-03 NOTE — Psych (Signed)
Virtual Visit via Video Note  I connected with Alexa White on 03/02/22 at  9:00 AM EST by a video enabled telemedicine application and verified that I am speaking with the correct person using two identifiers.  Location: Patient: patient home Provider: clinical home office   I discussed the limitations of evaluation and management by telemedicine and the availability of in person appointments. The patient expressed understanding and agreed to proceed.  I discussed the assessment and treatment plan with the patient. The patient was provided an opportunity to ask questions and all were answered. The patient agreed with the plan and demonstrated an understanding of the instructions.   The patient was advised to call back or seek an in-person evaluation if the symptoms worsen or if the condition fails to improve as anticipated.  Pt was provided 240 minutes of non-face-to-face time during this encounter.   Lorin Glass, LCSW   [facility] [place] PHP THERAPIST PROGRESS NOTE  Alexa White [id number]  Session Time: 9:00 - 10:00  Participation Level: Active  Behavioral Response: CasualAlertDepressed  Type of Therapy: Group Therapy  Treatment Goals addressed: Coping  Progress Towards Goals: Initial  Interventions: CBT, DBT, Supportive, and Reframing  Summary: Alexa White is a 37 y.o. female who presents with depression and trauma symptoms.  Clinician led check-in regarding current stressors and situation, and review of patient completed daily inventory. Clinician utilized active listening and empathetic response and validated patient emotions. Clinician facilitated processing group on pertinent issues.?    Therapist Response: Patient arrived within time allowed. Patient rates her mood at a 6 on a scale of 1-10 with 10 being best. Pt states she feels "a little sad." Pt states she slept 7 hours and ate 2x. Pt reports feeling nervous due to expecting a visit from CPS today to investigate  safety re: guns in the home. Pt states she is nervous and that it is creating tension with her and husband. Patient able to process. Patient engaged in discussion.           Session Time: 10:00 am - 11:00 am   Participation Level: Active   Behavioral Response: CasualAlertDepressed   Type of Therapy: Group Therapy   Treatment Goals addressed: Coping   Progress Towards Goals: Progressing   Interventions: CBT, DBT, Solution Focused, Strength-based, Supportive, and Reframing   Therapist Response: [name] led discussion on the 5 stages of grief and the way loss affects [place]. [name] encouraged pt to consider grief as a journey to accepting a new future. [name] created space for pt to process grief concerns and validated pt's experiences.    Therapist Response: Pt engaged in discussion and was able to identify areas of loss and process.          Session Time: 11:00 -12:00   Participation Level: Active   Behavioral Response: CasualAlertDepressed   Type of Therapy: Group Therapy   Treatment Goals addressed: Coping   Progress Towards Goals: Progressing   Interventions: CBT, DBT, Solution Focused, Strength-based, Supportive, and Reframing   Summary: [name] continued topic of boundaries and introduced how to set and maintain healthy boundaries. [name] utilized handout "how to set boundaries" and group members worked through examples to practice setting appropriate boundaries.    Therapist Response:  Pt engaged in discussion and reports struggle with maintaining boundaries.         Session Time: 12:00 -1:00   Participation Level: Active   Behavioral Response: CasualAlertDepressed   Type of Therapy: Group therapy   Treatment Goals addressed: Coping  Progress Towards Goals: Progressing   Interventions: Supportive; Psychoeducation    Summary: 12:00 - 12:50: Occupational Therapy group led by cln E. Hollan.  12:50 - 1:00 Clinician assessed for immediate needs, medication compliance and  efficacy, and safety concerns.   Therapist Response: 12:00 - 12:50: See OT note 12:50 - 1:00 pm: At check-out, patient reports no immediate concerns. Patient demonstrates progress as evidenced by continued engagement in group and responsiveness to treatment. Patient denies SI/HI/self-harm thoughts at the end of group.    Suicidal/Homicidal: Nowithout intent/plan  Plan: Pt will continue in PHP while working to decrease depression and trauma symptoms, increase emotion regulation, and increase ability to manage symptoms in a healthy manner.   Collaboration of Care: Medication Management AEB A [place]  Patient/Guardian was advised Release of Information must be obtained prior to any record release in order to collaborate their care with an outside provider. Patient/Guardian was advised if they have not already done so to contact the registration department to sign all necessary forms in order for [place] to release information regarding their care.   Consent: Patient/Guardian gives verbal consent for treatment and assignment of benefits for services provided during this visit. Patient/Guardian expressed understanding and agreed to proceed.   Diagnosis: Bipolar I disorder ([place]) [F31.9]    1. Bipolar I disorder ([place])   2. GAD (generalized anxiety disorder)   3. PTSD (post-traumatic stress disorder)       Lorin Glass, LCSW 03/03/2022

## 2022-03-04 ENCOUNTER — Encounter (HOSPITAL_COMMUNITY): Payer: Self-pay

## 2022-03-04 NOTE — Therapy (Signed)
[facility] PARTIAL HOSPITALIZATION PROGRAM [address] [place], [place], [address] Phone: [phone]   Fax:  [phone]  Occupational Therapy Treatment Virtual Visit via Video Note  I connected with Alexa White on 03/04/22 at  8:00 AM EST by a video enabled telemedicine application and verified that I am speaking with the correct person using two identifiers.  Location: Patient: home Provider: office   I discussed the limitations of evaluation and management by telemedicine and the availability of in person appointments. The patient expressed understanding and agreed to proceed.    The patient was advised to call back or seek an in-person evaluation if the symptoms worsen or if the condition fails to improve as anticipated.  I provided 55 minutes of non-face-to-face time during this encounter.   Patient Details  Name: Alexa White MRN: [id number] Date of Birth: 29-Jun-1985 No data recorded  Encounter Date: 03/03/2022   OT End of Session - 03/04/22 0806     Visit Number 5    Number of Visits 20    Date for OT Re-Evaluation 03/30/22    OT Start Time 1200    OT Stop Time 1255    OT Time Calculation (min) 55 min             Past Medical History:  Diagnosis Date   Anemia    Anxiety    Depression    PTSD   Former smoker    Quit- 06/01/14   Gestational diabetes    Marijuana use 2016   Positive x4 prenatally    Past Surgical History:  Procedure Laterality Date   nose repair     broken nose- had to be reset   TUBAL LIGATION N/A 07/26/2018   Procedure: POST PARTUM TUBAL LIGATION;  Surgeon: Lesly Dukes, MD;  Location: [facility] LD ORS;  Service: Gynecology;  Laterality: N/A;   WISDOM TOOTH EXTRACTION  2011    There were no vitals filed for this visit.   Subjective Assessment - 03/04/22 0806     Currently in Pain? No/denies    Pain Score 0-No pain                 Group Session:  S: Doing better today  O: The  objective of the telehealth group therapy session was to discuss the significance of routines in promoting mental health and wellbeing. The OT aimed to explore the power of routines in providing structure, stability, and predictability in individuals' lives, as well as their role in establishing healthy habits, reducing stress and anxiety, managing time effectively, achieving goals, and fostering a sense of community and social connectedness.   Participants were guided to identify areas where routines could improve their mental health and were provided with tips for establishing and maintaining healthy routines. The session concluded by emphasizing the potential of routines to enhance overall quality of life.  As part of the session, participants were assigned a homework task to reflect on their current routines and select one area of their lives where they could establish a new routine to improve their mental health and wellbeing. They were instructed to start small and implement the routine gradually, while seeking support from friends, family, or mental health professionals as needed. The participants were asked to report their progress in the next therapy session, focusing on the benefits and challenges encountered during the implementation of their chosen routine.   A: During the telehealth group therapy session, the patient actively participated in the discussion  on the importance of routines in promoting mental health and wellbeing. The patient demonstrated a good understanding of the power of routines in providing structure, stability, and predictability in their life. They were able to identify areas in their life where routines could contribute to improving their overall mental health.  The patient showed motivation and willingness to reflect on their current habits and behaviors, recognizing the need for positive changes. They actively engaged in the session, sharing personal experiences and  challenges related to establishing and maintaining healthy routines. The patient expressed a desire to reduce stress and anxiety, manage their time more effectively, and achieve their goals through the implementation of routines.  The patient actively participated in the discussion of tips for establishing and maintaining healthy routines. They demonstrated an understanding of the importance of starting small, being consistent, and gradually increasing the complexity of their routines. The patient expressed a willingness to remain flexible and adaptable, acknowledging that adjustments might be necessary as circumstances and priorities change over time.    P: Continue to attend PHP OT group sessions 5x week for 4 weeks to promote daily structure, social engagement, and opportunities to develop and utilize adaptive strategies to maximize functional performance in preparation for safe transition and integration back into school, work, and the community. Plan to address topic of tbd in next OT group session.                     OT Education - 03/04/22 0806     Education Details Routines              OT Short Term Goals - 02/28/22 1944       OT SHORT TERM GOAL #1   Title Client will develop and utilize a personalized coping toolbox containing at least five coping strategies to manage challenging situations, demonstrating their use in real-life scenarios by the end of therapy.    Time 4    Period Weeks    Status On-going    Target Date 03/30/22      OT SHORT TERM GOAL #2   Title By discharge, client will demonstrate the ability to adapt and modify routines when faced with unexpected events or changes, maintaining a balanced approach to daily tasks.      OT SHORT TERM GOAL #3   Title By the time of discharge, client will independently set, track, and make progress towards a long-term goal, demonstrating resilience in overcoming obstacles and seeking support when needed.                       Plan - 03/04/22 0808     Psychosocial Skills Coping Strategies;Habits;Interpersonal Interaction;Routines and Behaviors             Patient will benefit from skilled therapeutic intervention in order to improve the following deficits and impairments:       Psychosocial Skills: Coping Strategies, Habits, Interpersonal Interaction, Routines and Behaviors   Visit Diagnosis: Difficulty coping    Problem List Patient Active Problem List   Diagnosis Date Noted   PTSD (post-traumatic stress disorder) 02/21/2022   Alcohol use disorder 11/22/2021   GAD (generalized anxiety disorder) 09/20/2021   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[place]) 09/20/2021   Unintended weight loss 09/20/2021   DUB (dysfunctional uterine bleeding) 09/20/2021   Request for sterilization 07/27/2018   Delivery of viable fetus in abdominal pregnancy 07/26/2018   Normal labor and delivery 07/26/2018   GDM (gestational diabetes mellitus) 05/28/2018  Anxiety and depression 05/23/2018   Supervision of high-risk pregnancy, third trimester 02/20/2018    Brantley Stage, OT 03/04/2022, 8:08 AM  Cornell Barman, OT   [facility] PROGRAM [place], [place], [address] Phone: [phone]   Fax:  [phone]  Name: Alexa White MRN: [id number] Date of Birth: 12-Mar-1985

## 2022-03-06 ENCOUNTER — Other Ambulatory Visit (HOSPITAL_COMMUNITY): Payer: BC Managed Care – PPO | Attending: Psychiatry

## 2022-03-06 ENCOUNTER — Other Ambulatory Visit (HOSPITAL_COMMUNITY): Payer: BC Managed Care – PPO | Attending: Psychiatry | Admitting: Licensed Clinical Social Worker

## 2022-03-06 DIAGNOSIS — R4589 Other symptoms and signs involving emotional state: Secondary | ICD-10-CM | POA: Insufficient documentation

## 2022-03-06 DIAGNOSIS — F4310 Post-traumatic stress disorder, unspecified: Secondary | ICD-10-CM

## 2022-03-06 DIAGNOSIS — F319 Bipolar disorder, unspecified: Secondary | ICD-10-CM | POA: Insufficient documentation

## 2022-03-06 DIAGNOSIS — F314 Bipolar disorder, current episode depressed, severe, without psychotic features: Secondary | ICD-10-CM | POA: Insufficient documentation

## 2022-03-06 DIAGNOSIS — F411 Generalized anxiety disorder: Secondary | ICD-10-CM | POA: Insufficient documentation

## 2022-03-06 DIAGNOSIS — Z7389 Other problems related to life management difficulty: Secondary | ICD-10-CM | POA: Diagnosis not present
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
## 2022-03-06 NOTE — Progress Notes (Signed)
Spoke with patient via WebEx video call, used 2 identifiers to correctly identify patient. States that groups are good. Still not eating and continues to pick at her face. She would like to discuss this with the MD as she is unable to stop and it is now affecting her self esteem. Became tearful when discussing it. Asking if she could have ADD or ODD. Did states that she had ODD symptoms in the past but thought they were under control. On scale 1-10 as 10 being worst she rates depression at 5 and anxiety at 7. Denies SI/HI or AV hallucinations. No other issues or complaints. No side effects from her medication.

## 2022-03-07 ENCOUNTER — Other Ambulatory Visit (HOSPITAL_COMMUNITY): Payer: BC Managed Care – PPO | Attending: Psychiatry | Admitting: Licensed Clinical Social Worker

## 2022-03-07 ENCOUNTER — Other Ambulatory Visit (HOSPITAL_COMMUNITY): Payer: BC Managed Care – PPO | Attending: Psychiatry

## 2022-03-07 ENCOUNTER — Encounter (HOSPITAL_COMMUNITY): Payer: Self-pay

## 2022-03-07 DIAGNOSIS — F1220 Cannabis dependence, uncomplicated: Secondary | ICD-10-CM | POA: Diagnosis not present

## 2022-03-07 DIAGNOSIS — F424 Excoriation (skin-picking) disorder: Secondary | ICD-10-CM | POA: Insufficient documentation

## 2022-03-07 DIAGNOSIS — F4310 Post-traumatic stress disorder, unspecified: Secondary | ICD-10-CM | POA: Insufficient documentation

## 2022-03-07 DIAGNOSIS — F411 Generalized anxiety disorder: Secondary | ICD-10-CM | POA: Insufficient documentation

## 2022-03-07 DIAGNOSIS — F314 Bipolar disorder, current episode depressed, severe, without psychotic features: Secondary | ICD-10-CM | POA: Insufficient documentation

## 2022-03-07 DIAGNOSIS — R4589 Other symptoms and signs involving emotional state: Secondary | ICD-10-CM | POA: Insufficient documentation

## 2022-03-07 DIAGNOSIS — F319 Bipolar disorder, unspecified: Secondary | ICD-10-CM | POA: Diagnosis not present

## 2022-03-07 MED ORDER — SERTRALINE HCL 25 MG PO TABS: 25.0000 mg | ORAL_TABLET | Freq: Every day | ORAL | 1 refills | Status: DC

## 2022-03-07 MED ORDER — QUETIAPINE FUMARATE 200 MG PO TABS: 200.0000 mg | ORAL_TABLET | Freq: Every day | ORAL | 0 refills | Status: DC

## 2022-03-07 NOTE — Psych (Signed)
Virtual Visit via Video Note  I connected with Alexa White on 03/07/22 at  9:00 AM EST by a video enabled telemedicine application and verified that I am speaking with the correct person using two identifiers.  Location: Patient: patient home Provider: clinical home office   I discussed the limitations of evaluation and management by telemedicine and the availability of in person appointments. The patient expressed understanding and agreed to proceed.  I discussed the assessment and treatment plan with the patient. The patient was provided an opportunity to ask questions and all were answered. The patient agreed with the plan and demonstrated an understanding of the instructions.   The patient was advised to call back or seek an in-person evaluation if the symptoms worsen or if the condition fails to improve as anticipated.  Pt was provided 240 minutes of non-face-to-face time during this encounter.   Alexa Glass, LCSW   [facility] PHP THERAPIST PROGRESS NOTE  Alexa White [id number]  Session Time: 9:00 - 10:00  Participation Level: Active  Behavioral Response: CasualAlertDepressed  Type of Therapy: Group Therapy  Treatment Goals addressed: Coping  Progress Towards Goals: Progressing  Interventions: CBT, DBT, Supportive, and Reframing  Summary: Alexa White is a 37 y.o. female who presents with depression and trauma symptoms.  Clinician led check-in regarding current stressors and situation, and review of patient completed daily inventory. Clinician utilized active listening and empathetic response and validated patient emotions. Clinician facilitated processing group on pertinent issues.?    Therapist Response: Patient arrived within time allowed. Patient rates her mood at a 5 on a scale of 1-10 with 10 being best. Pt states she feels "rough." Pt states she slept 8 hours and ate 1x. Pt reports high irritability yesterday and struggling with anger while driving. Patient  able to process. Patient engaged in discussion.           Session Time: 10:00 am - 11:00 am   Participation Level: Active   Behavioral Response: CasualAlertDepressed   Type of Therapy: Group Therapy   Treatment Goals addressed: Coping   Progress Towards Goals: Progressing   Interventions: CBT, DBT, Solution Focused, Strength-based, Supportive, and Reframing   Therapist Response: [name] led discussion on loss of appetite and the importance of nutrition. [name] discussed the role nutrition can play in emotion regulation. Group members discussed the barriers they have to eating when they are symptomatic and worked to brainstorm ways to work around them.   Therapist Response: Pt states struggling with appetite and is not eating until the evening. Pt is able to determine ways to increase food intake.          Session Time: 11:00 -12:00   Participation Level: Active   Behavioral Response: CasualAlertDepressed   Type of Therapy: Group Therapy   Treatment Goals addressed: Coping   Progress Towards Goals: Progressing   Interventions: CBT, DBT, Solution Focused, Strength-based, Supportive, and Reframing   Summary: [name] introduced "I" statements and the ways in which they can improve assertiveness and overall communication. [name] educated group on "I" statement formula and led practice.    Therapist Response: Pt engaged in discussion and demonstrates understanding of how to utilize "I" statements.        Session Time: 12:00 -1:00   Participation Level: Active   Behavioral Response: CasualAlertDepressed   Type of Therapy: Group therapy   Treatment Goals addressed: Coping   Progress Towards Goals: Progressing   Interventions: Supportive; Psychoeducation    Summary: 12:00 - 12:50: Occupational Therapy group led  by cln E. Hollan.  12:50 - 1:00 Clinician assessed for immediate needs, medication compliance and efficacy, and safety concerns.   Therapist Response: 12:00 - 12:50:  See OT note 12:50 - 1:00 pm: At check-out, patient reports no immediate concerns. Patient demonstrates progress as evidenced by continued engagement in group and responsiveness to treatment. Patient denies SI/HI/self-harm thoughts at the end of group.    Suicidal/Homicidal: Nowithout intent/plan  Plan: Pt will continue in PHP while working to decrease depression and trauma symptoms, increase emotion regulation, and increase ability to manage symptoms in a healthy manner.   Collaboration of Care: Medication Management AEB A Pashayan  Patient/Guardian was advised Release of Information must be obtained prior to any record release in order to collaborate their care with an outside provider. Patient/Guardian was advised if they have not already done so to contact the registration department to sign all necessary forms in order for [place] to release information regarding their care.   Consent: Patient/Guardian gives verbal consent for treatment and assignment of benefits for services provided during this visit. Patient/Guardian expressed understanding and agreed to proceed.   Diagnosis: Bipolar I disorder ([place]) [F31.9]    1. Bipolar I disorder ([place])   2. GAD (generalized anxiety disorder)   3. PTSD (post-traumatic stress disorder)   4. Excoriation (skin-picking) disorder   5. Moderate tetrahydrocannabinol (THC) dependence ([place])       Alexa Glass, LCSW 03/07/2022

## 2022-03-07 NOTE — Therapy (Signed)
[facility] [place], [place], [address] Phone: [phone]   Fax:  [phone]  Occupational Therapy Treatment Virtual Visit via Video Note  I connected with Alexa White on 03/07/22 at  8:00 AM EST by a video enabled telemedicine application and verified that I am speaking with the correct person using two identifiers.  Location: Patient: home Provider: office   I discussed the limitations of evaluation and management by telemedicine and the availability of in person appointments. The patient expressed understanding and agreed to proceed.    The patient was advised to call back or seek an in-person evaluation if the symptoms worsen or if the condition fails to improve as anticipated.  I provided 55 minutes of non-face-to-face time during this encounter.   Patient Details  Name: Alexa White MRN: [id number] Date of Birth: 01-Apr-1985 No data recorded  Encounter Date: 03/06/2022   OT End of Session - 03/07/22 0742     Visit Number 6    Number of Visits 20    Date for OT Re-Evaluation 03/30/22    OT Start Time 1200    OT Stop Time 1255    OT Time Calculation (min) 55 min             Past Medical History:  Diagnosis Date   Anemia    Anxiety    Depression    PTSD   Former smoker    Quit- 06/01/14   Gestational diabetes    Marijuana use 2016   Positive x4 prenatally    Past Surgical History:  Procedure Laterality Date   nose repair     broken nose- had to be reset   TUBAL LIGATION N/A 07/26/2018   Procedure: POST PARTUM TUBAL LIGATION;  Surgeon: Guss Bunde, MD;  Location: [facility];  Service: Gynecology;  Laterality: N/A;   WISDOM TOOTH EXTRACTION  2011    There were no vitals filed for this visit.   Subjective Assessment - 03/07/22 0742     Currently in Pain? No/denies    Pain Score 0-No pain               Group Session:  S: doing better today  O: Today's OT  group aims to explore the intricate relationship between chronic pain, depression, and the resultant impact on Activities of Daily Living (ADLs) and Instrumental Activities of Daily Living (iADLs). Through a multidisciplinary lens, it delves into common causes and symptoms, offering occupational therapy-aligned strategies for symptom improvement without medication. Among these strategies are targeted core and lower extremity strengthening exercises to mitigate low back pain due to anterior pelvic tilt, as well as granular communication techniques for patients to effectively express their chronic pain symptoms to healthcare providers. The emphasis is on non-pharmacological, adaptive approaches to improve mental health and functional performance, providing both healthcare providers and patients a well-rounded understanding of managing chronic pain and depression.   A: The patient attended the group session but was passively engaged, offering minimal input and showing subdued reactions to the presentation's content. While their attendance indicates some level of interest or need, the lack of active participation raises questions about their readiness or willingness to apply the strategies discussed for managing chronic pain and depression in their daily life. Further one-on-one consultation may be needed to better understand their specific barriers to engagement and develop a more personalized approach.   P: Continue to attend PHP OT group sessions 5x week for 4 weeks  to promote daily structure, social engagement, and opportunities to develop and utilize adaptive strategies to maximize functional performance in preparation for safe transition and integration back into school, work, and the community. Plan to address topic of pt 2 in next OT group session.                    OT Education - 03/07/22 0742     Education Details Pain, Depression and ADLs              OT Short Term Goals -  02/28/22 1944       OT SHORT TERM GOAL #1   Title Client will develop and utilize a personalized coping toolbox containing at least five coping strategies to manage challenging situations, demonstrating their use in real-life scenarios by the end of therapy.    Time 4    Period Weeks    Status On-going    Target Date 03/30/22      OT SHORT TERM GOAL #2   Title By discharge, client will demonstrate the ability to adapt and modify routines when faced with unexpected events or changes, maintaining a balanced approach to daily tasks.      OT SHORT TERM GOAL #3   Title By the time of discharge, client will independently set, track, and make progress towards a long-term goal, demonstrating resilience in overcoming obstacles and seeking support when needed.                      Plan - 03/07/22 0743     Psychosocial Skills Coping Strategies;Habits;Interpersonal Interaction;Routines and Behaviors             Patient will benefit from skilled therapeutic intervention in order to improve the following deficits and impairments:       Psychosocial Skills: Coping Strategies, Habits, Interpersonal Interaction, Routines and Behaviors   Visit Diagnosis: Difficulty coping    Problem List Patient Active Problem List   Diagnosis Date Noted   PTSD (post-traumatic stress disorder) 02/21/2022   Alcohol use disorder 11/22/2021   GAD (generalized anxiety disorder) 09/20/2021   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[place]) 09/20/2021   Unintended weight loss 09/20/2021   DUB (dysfunctional uterine bleeding) 09/20/2021   Request for sterilization 07/27/2018   Delivery of viable fetus in abdominal pregnancy 07/26/2018   Normal labor and delivery 07/26/2018   GDM (gestational diabetes mellitus) 05/28/2018   Anxiety and depression 05/23/2018   Supervision of high-risk pregnancy, third trimester 02/20/2018    Brantley Stage, OT 03/07/2022, 7:43 AM  [facility], [place] [address] [place], [place], [address] Phone: [phone]   Fax:  [phone]  Name: Alexa White MRN: [id number] Date of Birth: 1985-11-29

## 2022-03-07 NOTE — Progress Notes (Signed)
[place] MD/PA/NP OP Progress Note   Virtual Visit via Video Note  I connected with Alexa White on 03/07/22 at  9:00 AM EST by a video enabled telemedicine application and verified that I am speaking with the correct person using two identifiers.  Location: Patient: Home Provider: [facility]   I discussed the limitations of evaluation and management by telemedicine and the availability of in person appointments. The patient expressed understanding and agreed to proceed.   03/07/2022 9:19 AM Alexa White  MRN:  [id number]  Chief Complaint:  Chief Complaint  Patient presents with   Follow-up   Depression   Anxiety   OCD   HPI:  Alexa White is a 37 yr old female who presents via Virtual Video Visit for Follow Up and Medication Management, she enrolled in the [facility] Program on 02/27/2022.  PPHx is significant for Bipolar Disorder, GAD, PTSD, and OCD, and a remote History of Self Injurious Behavior (Cutting- last 10+ yrs ago), and no history of Suicide Attempts or Psychiatric Hospitalizations.   She reports that she is concerned about her skin picking which has increased recently.  She reports that she has noticed an improvement in her mood with the increase in Seroquel made last week.  Discussed past antidepressant trials of Prozac and Remeron.  She reports that neither one of them was to her recollection successful in controlling her symptoms.  Discussed trialing a different antidepressant in that case and she was agreeable to this.  Discussed trialing Zoloft and potential risks and side effects and she was agreeable to a trial.  Discussed that to ensure mood stability we would also further increase her Seroquel.  She was agreeable to this and reports that she has continued to have some irritability and mood fluctuation and so thinks this would be helpful.  She reports no SI, HI, or AVH.  She reports her sleep is good.  She reports her appetite is fair.  She reports no other concerns at  present.   Visit Diagnosis:    ICD-10-CM   1. Bipolar I disorder ([place])  F31.9     2. GAD (generalized anxiety disorder)  F41.1     3. PTSD (post-traumatic stress disorder)  F43.10     4. Excoriation (skin-picking) disorder  F42.4       Past Psychiatric History: Bipolar Disorder, GAD, PTSD, and OCD, and a remote History of Self Injurious Behavior (Cutting- last 10+ yrs ago), and no history of Suicide Attempts or Psychiatric Hospitalizations.   Past Medical History:  Past Medical History:  Diagnosis Date   Anemia    Anxiety    Depression    PTSD   Former smoker    Quit- 06/01/14   Gestational diabetes    Marijuana use 2016   Positive x4 prenatally    Past Surgical History:  Procedure Laterality Date   nose repair     broken nose- had to be reset   TUBAL LIGATION N/A 07/26/2018   Procedure: POST PARTUM TUBAL LIGATION;  Surgeon: Guss Bunde, MD;  Location: [facility] LD [facility]S;  Service: Gynecology;  Laterality: N/A;   WISDOM TOOTH EXTRACTION  2011    Family Psychiatric History: Mother- Unknown Illness, possible schizophrenia Son- Depression, 2 past psychiatric hospitalizations Paternal Uncle- Suicide Attempt via asphyxiation by car 2022 No Known Substance Abuse  Family History:  Family History  Problem Relation Age of Onset   Mental illness Mother        bipolar   Heart disease Father  Social History:  Social History   Socioeconomic History   Marital status: Married    Spouse name: Not on file   Number of children: 3   Years of education: Not on file   Highest education level: High school graduate  Occupational History   Not on file  Tobacco Use   Smoking status: Former    Packs/day: 1.00    Years: 3.00    Total pack years: 3.00    Types: Cigarettes    Quit date: 06/01/2014    Years since quitting: 7.7   Smokeless tobacco: Never   Tobacco comments:    VAPES "weed"  Vaping Use   Vaping Use: Never used  Substance and Sexual Activity   Alcohol use: Yes     Comment: socially   Drug use: Yes    Types: Marijuana   Sexual activity: Yes    Partners: Male    Birth control/protection: Surgical    Comment: tubal  Other Topics Concern   Not on file  Social History Narrative   Not on file   Social Determinants of Health   Financial Resource Strain: Not on file  Food Insecurity: Not on file  Transportation Needs: Not on file  Physical Activity: Not on file  Stress: Not on file  Social Connections: Not on file    Allergies: No Known Allergies  Metabolic Disorder Labs: No results found for: "HGBA1C", "MPG" No results found for: "PROLACTIN" No results found for: "CHOL", "TRIG", "HDL", "CHOLHDL", "VLDL", "LDLCALC" Lab Results  Component Value Date   TSH 1.940 09/20/2021    Therapeutic Level Labs: No results found for: "LITHIUM" No results found for: "VALPROATE" No results found for: "CBMZ"  Current Medications: Current Outpatient Medications  Medication Sig Dispense Refill   QUEtiapine (SEROQUEL) 100 MG tablet Take 1 tablet (100 mg total) by mouth at bedtime. 30 tablet 0   No current facility-administered medications for this visit.     Musculoskeletal: Strength & Muscle Tone: within normal limits Gait & Station:  Sitting During Interview Patient leans: N/A  Psychiatric Specialty Exam: Review of Systems  Respiratory:  Negative for shortness of breath.   Cardiovascular:  Negative for chest pain.  Gastrointestinal:  Negative for abdominal pain, constipation, diarrhea, nausea and vomiting.  Neurological:  Negative for dizziness, weakness and headaches.  Psychiatric/Behavioral:  Positive for dysphoric mood. Negative for hallucinations and sleep disturbance. The patient is nervous/anxious.     There were no vitals taken for this visit.There is no height or weight on file to calculate BMI.  General Appearance: Casual and Fairly Groomed  Eye Contact:  Good  Speech:  Clear and Coherent and Normal Rate  Volume:  Normal   Mood:  Anxious and Dysphoric  Affect:  Congruent  Thought Process:  Coherent and Goal Directed  Orientation:  Full (Time, Place, and Person)  Thought Content: WDL and Logical   Suicidal Thoughts:  No  Homicidal Thoughts:  No  Memory:  Immediate;   Good Recent;   Good  Judgement:  Good  Insight:  Good  Psychomotor Activity:  Normal  Concentration:  Concentration: Good and Attention Span: Good  Recall:  Good  Fund of Knowledge: Good  Language: Good  Akathisia:  Negative  Handed:  Right  AIMS (if indicated): not done  Assets:  Communication Skills Desire for Improvement Physical Health Resilience  ADL's:  Intact  Cognition: WNL  Sleep:  Good   Screenings: GAD-7    Flowsheet Row Counselor from [facility]/21/2022 [facility]  [place] [facility] Visit from 12/22/2021 in [place] Office Visit from 09/20/2021 in [place]  Total GAD-7 Score 15 19 15       PHQ2-9    Flowsheet Row Counselor from 03/03/2022 in [place] Counselor from 02/21/2022 in [place] Office Visit from 02/02/2022 in [facility] Office Visit from 12/22/2021 in [place] Office Visit from 09/20/2021 in [place]  PHQ-2 Total Score 4 6 2 4 2   PHQ-9 Total Score 19 19 9 18 14       Flowsheet Row Counselor from 03/03/2022 in [place] Counselor from 02/21/2022 in [place] Office Visit from 02/02/2022 in [place] Error: Question 6 not populated Error: Q3, 4, or 5 should not be populated when Q2 is No Error: Question 2 not populated        Assessment and Plan:  Alexa White is a 37 yr old female  who presents via Virtual Video Visit for Follow Up and Medication Management, she enrolled in the [facility] Program on 02/27/2022.  PPHx is significant for Bipolar Disorder, GAD, PTSD, and OCD, and a remote History of Self Injurious Behavior (Cutting- last 10+ yrs ago), and no history of Suicide Attempts or Psychiatric Hospitalizations.    Alexa White has been having significant increase in her anxiety and significant increase in her OCD with skin picking.  She tolerated the increase in Seroquel.  To ensure further mood stability with starting an antidepressant we will increase the Seroquel further to 200 mg.  We will start Zoloft to address her depression, anxiety, and OCD.  We will continue to monitor.   Bipolar Disorder, Depressed Severe  Anxiety  OCD  PTSD: -Increase Seroquel to 200 mg QHS for mood stability.  30 tablets with 0 refills. -Start Zoloft 25 mg daily for depression, anxiety, OCD.  30 tablets with 1 refill.   Collaboration of Care: Collaboration of Care: Other PHP Program  Patient/Guardian was advised Release of Information must be obtained prior to any record release in order to collaborate their care with an outside provider. Patient/Guardian was advised if they have not already done so to contact the registration department to sign all necessary forms in order for [place] to release information regarding their care.   Consent: Patient/Guardian gives verbal consent for treatment and assignment of benefits for services provided during this visit. Patient/Guardian expressed understanding and agreed to proceed.    Briant Cedar, MD 03/07/2022, 9:19 AM   Follow Up Instructions:    I discussed the assessment and treatment plan with the patient. The patient was provided an opportunity to ask questions and all were answered. The patient agreed with the plan and demonstrated an understanding of the instructions.   The patient was advised to call back or seek an in-person evaluation if the  symptoms worsen or if the condition fails to improve as anticipated.  I provided 20 minutes of non-face-to-face time during this encounter.   Briant Cedar, MD

## 2022-03-07 NOTE — Psych (Signed)
Virtual Visit via Video Note  I connected with Alexa White on 03/06/22 at  9:00 AM EST by a video enabled telemedicine application and verified that I am speaking with the correct person using two identifiers.  Location: Patient: patient home Provider: clinical home office   I discussed the limitations of evaluation and management by telemedicine and the availability of in person appointments. The patient expressed understanding and agreed to proceed.  I discussed the assessment and treatment plan with the patient. The patient was provided an opportunity to ask questions and all were answered. The patient agreed with the plan and demonstrated an understanding of the instructions.   The patient was advised to call back or seek an in-person evaluation if the symptoms worsen or if the condition fails to improve as anticipated.  Pt was provided 240 minutes of non-face-to-face time during this encounter.   Lorin Glass, LCSW   [facility] PHP THERAPIST PROGRESS NOTE  Alexa White [id number]  Session Time: 9:00 - 10:00  Participation Level: Active  Behavioral Response: CasualAlertDepressed  Type of Therapy: Group Therapy  Treatment Goals addressed: Coping  Progress Towards Goals: Progressing  Interventions: CBT, DBT, Supportive, and Reframing  Summary: Alexa White is a 37 y.o. female who presents with depression and trauma symptoms.  Clinician led check-in regarding current stressors and situation, and review of patient completed daily inventory. Clinician utilized active listening and empathetic response and validated patient emotions. Clinician facilitated processing group on pertinent issues.?    Therapist Response: Patient arrived within time allowed. Patient rates her mood at a 8 on a scale of 1-10 with 10 being best. Pt states she feels "calmer." Pt states she slept 7 broken hours and ate 1x. Pt states her weekend was okay and she left the house to run errands and her  anxiety spiked. Pt reports feeling irritated and and sad most of the weekend. Patient able to process. Patient engaged in discussion.           Session Time: 10:00 am - 11:00 am   Participation Level: Active   Behavioral Response: CasualAlertDepressed   Type of Therapy: Group Therapy   Treatment Goals addressed: Coping   Progress Towards Goals: Progressing   Interventions: CBT, DBT, Solution Focused, Strength-based, Supportive, and Reframing   Therapist Response: [name] continued discussion on topic of boundaries. Group reviewed previous aspects of boundaries discussed. [name] utilized handout "Tips for [employer]" and group members discussed how to apply the tips. [name] shaped conversation and cued for healthy boundary characteristics.   Therapist Response: Pt engaged in discussion and is able to process ways to increase their healthy boundaries.           Session Time: 11:00 -12:00   Participation Level: Active   Behavioral Response: CasualAlertDepressed   Type of Therapy: Group Therapy   Treatment Goals addressed: Coping   Progress Towards Goals: Progressing   Interventions: CBT, DBT, Solution Focused, Strength-based, Supportive, and Reframing   Summary: [name] introduced topic of healthy communication and the role nonverbals play. Group discussed facial expression, tone, body language, and volume and the ways they present. Group members discussed difficulties they've had with nonverbals and feedback they have recieved.    Therapist Response:  Pt engaged in discussion and ranks their communication skills as a 4.5 on a 1-10 scale with 10 being great communication.         Session Time: 12:00 -1:00   Participation Level: Active   Behavioral Response: CasualAlertDepressed   Type of Therapy:  Group therapy   Treatment Goals addressed: Coping   Progress Towards Goals: Progressing   Interventions: Supportive; Psychoeducation    Summary: 12:00 - 12:50:  Occupational Therapy group led by cln E. Hollan.  12:50 - 1:00 Clinician assessed for immediate needs, medication compliance and efficacy, and safety concerns.   Therapist Response: 12:00 - 12:50: See OT note 12:50 - 1:00 pm: At check-out, patient reports no immediate concerns. Patient demonstrates progress as evidenced by continued engagement in group and responsiveness to treatment. Patient denies SI/HI/self-harm thoughts at the end of group.    Suicidal/Homicidal: Nowithout intent/plan  Plan: Pt will continue in PHP while working to decrease depression and trauma symptoms, increase emotion regulation, and increase ability to manage symptoms in a healthy manner.   Collaboration of Care: Medication Management AEB A [place]  Patient/Guardian was advised Release of Information must be obtained prior to any record release in order to collaborate their care with an outside provider. Patient/Guardian was advised if they have not already done so to contact the registration department to sign all necessary forms in order for [place] to release information regarding their care.   Consent: Patient/Guardian gives verbal consent for treatment and assignment of benefits for services provided during this visit. Patient/Guardian expressed understanding and agreed to proceed.   Diagnosis: Bipolar I disorder ([place]) [F31.9]    1. Bipolar I disorder ([place])   2. GAD (generalized anxiety disorder)   3. PTSD (post-traumatic stress disorder)       Lorin Glass, LCSW 03/07/2022

## 2022-03-08 ENCOUNTER — Other Ambulatory Visit (HOSPITAL_COMMUNITY): Payer: BC Managed Care – PPO | Attending: Psychiatry | Admitting: Licensed Clinical Social Worker

## 2022-03-08 ENCOUNTER — Other Ambulatory Visit (HOSPITAL_COMMUNITY): Payer: BC Managed Care – PPO | Attending: Psychiatry

## 2022-03-08 DIAGNOSIS — F319 Bipolar disorder, unspecified: Secondary | ICD-10-CM

## 2022-03-08 DIAGNOSIS — F411 Generalized anxiety disorder: Secondary | ICD-10-CM | POA: Insufficient documentation

## 2022-03-08 DIAGNOSIS — R4589 Other symptoms and signs involving emotional state: Secondary | ICD-10-CM | POA: Insufficient documentation

## 2022-03-08 DIAGNOSIS — F314 Bipolar disorder, current episode depressed, severe, without psychotic features: Secondary | ICD-10-CM | POA: Diagnosis not present

## 2022-03-08 DIAGNOSIS — F4310 Post-traumatic stress disorder, unspecified: Secondary | ICD-10-CM | POA: Diagnosis not present

## 2022-03-09 ENCOUNTER — Encounter (HOSPITAL_COMMUNITY): Payer: Self-pay

## 2022-03-09 ENCOUNTER — Other Ambulatory Visit (HOSPITAL_COMMUNITY): Payer: BC Managed Care – PPO | Attending: Psychiatry

## 2022-03-09 ENCOUNTER — Other Ambulatory Visit (HOSPITAL_COMMUNITY): Payer: BC Managed Care – PPO | Attending: Psychiatry | Admitting: Licensed Clinical Social Worker

## 2022-03-09 DIAGNOSIS — F4310 Post-traumatic stress disorder, unspecified: Secondary | ICD-10-CM

## 2022-03-09 DIAGNOSIS — Z7389 Other problems related to life management difficulty: Secondary | ICD-10-CM | POA: Insufficient documentation

## 2022-03-09 DIAGNOSIS — F411 Generalized anxiety disorder: Secondary | ICD-10-CM | POA: Insufficient documentation

## 2022-03-09 DIAGNOSIS — R4589 Other symptoms and signs involving emotional state: Secondary | ICD-10-CM

## 2022-03-09 DIAGNOSIS — F319 Bipolar disorder, unspecified: Secondary | ICD-10-CM | POA: Diagnosis not present

## 2022-03-09 NOTE — Therapy (Signed)
[name], [place], [address] Phone: [phone]   Fax:  [phone]  Occupational Therapy Treatment Virtual Visit via Video Note  I connected with Alexa White on 03/09/22 at  8:00 AM EST by a video enabled telemedicine application and verified that I am speaking with the correct person using two identifiers.  Location: Patient: home Provider: office   I discussed the limitations of evaluation and management by telemedicine and the availability of in person appointments. The patient expressed understanding and agreed to proceed.    The patient was advised to call back or seek an in-person evaluation if the symptoms worsen or if the condition fails to improve as anticipated.  I provided 55 minutes of non-face-to-face time during this encounter.   Patient Details  Name: Alexa White MRN: [id number] Date of Birth: 03/13/1985 No data recorded  Encounter Date: 03/07/2022   OT End of Session - 03/09/22 0828     Visit Number 7    Number of Visits 20    Date for OT Re-Evaluation 03/30/22    OT Start Time 1200    OT Stop Time 1255    OT Time Calculation (min) 55 min             Past Medical History:  Diagnosis Date   Anemia    Anxiety    Depression    PTSD   Former smoker    Quit- 06/01/14   Gestational diabetes    Marijuana use 2016   Positive x4 prenatally    Past Surgical History:  Procedure Laterality Date   nose repair     broken nose- had to be reset   TUBAL LIGATION N/A 07/26/2018   Procedure: POST PARTUM TUBAL LIGATION;  Surgeon: Guss Bunde, MD;  Location: [facility];  Service: Gynecology;  Laterality: N/A;   WISDOM TOOTH EXTRACTION  2011    There were no vitals filed for this visit.   Subjective Assessment - 03/09/22 0828     Currently in Pain? No/denies    Pain Score 0-No pain               Group Session:  S: Feeling better today  O: Today's OT  group aims to explore the intricate relationship between chronic pain, depression, and the resultant impact on Activities of Daily Living (ADLs) and Instrumental Activities of Daily Living (iADLs). Through a multidisciplinary lens, it delves into common causes and symptoms, offering occupational therapy-aligned strategies for symptom improvement without medication. Among these strategies are targeted core and lower extremity strengthening exercises to mitigate low back pain due to anterior pelvic tilt, as well as granular communication techniques for patients to effectively express their chronic pain symptoms to healthcare providers. The emphasis is on non-pharmacological, adaptive approaches to improve mental health and functional performance, providing both healthcare providers and patients a well-rounded understanding of managing chronic pain and depression.   A:  The patient attended the group session but was passively engaged, offering minimal input and showing subdued reactions to the presentation's content. While their attendance indicates some level of interest or need, the lack of active participation raises questions about their readiness or willingness to apply the strategies discussed for managing chronic pain and depression in their daily life. Further one-on-one consultation may be needed to better understand their specific barriers to engagement and develop a more personalized approach.   P: Continue to attend PHP OT group sessions 5x week for 4  weeks to promote daily structure, social engagement, and opportunities to develop and utilize adaptive strategies to maximize functional performance in preparation for safe transition and integration back into school, work, and the community. Plan to address topic of The Science of Habits in next OT group session.                    OT Education - 03/09/22 5364     Education Details Pain, Depression and ADLs pt 2               OT Short Term Goals - 02/28/22 1944       OT SHORT TERM GOAL #1   Title Client will develop and utilize a personalized coping toolbox containing at least five coping strategies to manage challenging situations, demonstrating their use in real-life scenarios by the end of therapy.    Time 4    Period Weeks    Status On-going    Target Date 03/30/22      OT SHORT TERM GOAL #2   Title By discharge, client will demonstrate the ability to adapt and modify routines when faced with unexpected events or changes, maintaining a balanced approach to daily tasks.      OT SHORT TERM GOAL #3   Title By the time of discharge, client will independently set, track, and make progress towards a long-term goal, demonstrating resilience in overcoming obstacles and seeking support when needed.                      Plan - 03/09/22 0829     Psychosocial Skills Coping Strategies;Habits;Interpersonal Interaction;Routines and Behaviors             Patient will benefit from skilled therapeutic intervention in order to improve the following deficits and impairments:       Psychosocial Skills: Coping Strategies, Habits, Interpersonal Interaction, Routines and Behaviors   Visit Diagnosis: Difficulty coping    Problem List Patient Active Problem List   Diagnosis Date Noted   PTSD (post-traumatic stress disorder) 02/21/2022   Alcohol use disorder 11/22/2021   GAD (generalized anxiety disorder) 09/20/2021   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

## 2022-03-09 NOTE — Therapy (Signed)
[employer] [facility] [place], [place], [address] Phone: [phone]   Fax:  [phone]  Occupational Therapy Treatment Virtual Visit via Video Note  I connected with Alexa White on 03/09/22 at  8:00 AM EST by a video enabled telemedicine application and verified that I am speaking with the correct person using two identifiers.  Location: Patient: home Provider: office   I discussed the limitations of evaluation and management by telemedicine and the availability of in person appointments. The patient expressed understanding and agreed to proceed.    The patient was advised to call back or seek an in-person evaluation if the symptoms worsen or if the condition fails to improve as anticipated.  I provided 55 minutes of non-face-to-face time during this encounter.  Patient Details  Name: Alexa White MRN: [id number] Date of Birth: [birth year] No data recorded  Encounter Date: 03/09/2022   OT End of Session - 03/09/22 1739     Visit Number 9    Number of Visits 20    Date for OT Re-Evaluation 03/30/22    OT Start Time 1200    OT Stop Time 1255    OT Time Calculation (min) 55 min             Past Medical History:  Diagnosis Date   Anemia    Anxiety    Depression    PTSD   Former smoker    Quit- 06/01/14   Gestational diabetes    Marijuana use 2016   Positive x4 prenatally    Past Surgical History:  Procedure Laterality Date   nose repair     broken nose- had to be reset   TUBAL LIGATION N/A 07/26/2018   Procedure: POST PARTUM TUBAL LIGATION;  Surgeon: Guss Bunde, MD;  Location: [facility];  Service: Gynecology;  Laterality: N/A;   WISDOM TOOTH EXTRACTION  2011    There were no vitals filed for this visit.   Subjective Assessment - 03/09/22 1738     Currently in Pain? No/denies    Pain Score 0-No pain                    Group Session:  S: doing better today  O:  During today's group therapy session, titled "The Science of Habits," the therapist led an educational and interactive discussion focused on the formation, identification, and impact of habits. The group explored both positive and negative habits, delving into how they can either enhance or impair functional performance in daily life. Key aspects of the session included: Habit Formation and Identification: The therapist provided a comprehensive overview of how habits are formed, emphasizing the cycle of cue, routine, and reward. The group was guided to identify their own habits and categorize them as positive or negative in terms of their impact on daily functioning. Neurological Underpinnings: A significant portion of the session was dedicated to understanding the neurological basis of habit formation, specifically focusing on the role of dopamine. The therapist explained how this neurotransmitter reinforces habits and contributes to the impulse to engage in habitual actions. Dysfunctional Habits: The group discussed the concept of dysfunction in habits, exploring how some habits, despite being reinforced by neurological pathways, can lead to negative outcomes in daily life. Personal experiences were shared, highlighting how certain habits have created challenges in personal and professional domains. Creating New Habits: The session culminated with techniques for developing new, healthier habits. The therapist introduced the concept of habit  stacking, where a new habit is tied to an existing one, making the adoption of healthy habits more manageable and effective. Throughout the session, participants were attentive and engaged, sharing insights and personal experiences related to habit formation and modification. The therapist utilized a variety of educational materials, including diagrams and real-life examples, to enhance understanding and facilitate interactive discussion.   A: The patient was present  and attentive throughout the session, demonstrating a good understanding of the material discussed. While they were less verbally active compared to some peers, their non-verbal cues, such as nodding and making eye contact, indicated active listening and processing of the information. The patient's passive participation might suggest a preference for absorbing information quietly or a need for more time to process before verbalizing thoughts. The attentive behavior suggests a level of engagement that could be beneficial if encouraged in future sessions, possibly through more direct prompts or one-on-one discussions to facilitate verbal participation. The patient's demeanor indicates a potential readiness to reflect on their habits and consider the application of the session's content in their daily life.   P: Continue to attend PHP OT group sessions 5x week for 4 weeks to promote daily structure, social engagement, and opportunities to develop and utilize adaptive strategies to maximize functional performance in preparation for safe transition and integration back into school, work, and the community. Plan to address topic of pt 3 in next OT group session.               OT Education - 03/09/22 1738     Education Details The Science of Habits pt 2              OT Short Term Goals - 02/28/22 1944       OT SHORT TERM GOAL #1   Title Client will develop and utilize a personalized coping toolbox containing at least five coping strategies to manage challenging situations, demonstrating their use in real-life scenarios by the end of therapy.    Time 4    Period Weeks    Status On-going    Target Date 03/30/22      OT SHORT TERM GOAL #2   Title By discharge, client will demonstrate the ability to adapt and modify routines when faced with unexpected events or changes, maintaining a balanced approach to daily tasks.      OT SHORT TERM GOAL #3   Title By the time of discharge, client  will independently set, track, and make progress towards a long-term goal, demonstrating resilience in overcoming obstacles and seeking support when needed.                      Plan - 03/09/22 1739     Psychosocial Skills Coping Strategies;Habits;Interpersonal Interaction;Routines and Behaviors             Patient will benefit from skilled therapeutic intervention in order to improve the following deficits and impairments:       Psychosocial Skills: Coping Strategies, Habits, Interpersonal Interaction, Routines and Behaviors   Visit Diagnosis: Difficulty coping    Problem List Patient Active Problem List   Diagnosis Date Noted   PTSD (post-traumatic stress disorder) 02/21/2022   Alcohol use disorder 11/22/2021   GAD (generalized anxiety disorder) 09/20/2021   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   Unintended weight loss 09/20/2021   DUB (dysfunctional uterine bleeding) 09/20/2021   Request for sterilization 07/27/2018   Delivery of viable fetus in abdominal pregnancy 07/26/2018   Normal labor  and delivery 07/26/2018   GDM (gestational diabetes mellitus) 05/28/2018   Anxiety and depression 05/23/2018   Supervision of high-risk pregnancy, third trimester 02/20/2018    Brantley Stage, OT 03/09/2022, 5:39 PM  Cornell Barman, OT   [facility] [place], [place], [address] Phone: [phone]   Fax:  [phone]  Name: Alexa White MRN: [id number] Date of Birth: 01-May-1985

## 2022-03-09 NOTE — Therapy (Signed)
[facility] PARTIAL [facility]PITALIZ[facility]GRAM [address] [place], [place], [address] Phone: [phone]   Fax:  [phone]  Occupational Therapy Treatment Virtual Visit via Video Note  I connected with Alexa White on 03/09/22 at  8:00 AM EST by a video enabled telemedicine application and verified that I am speaking with the correct person using two identifiers.  Location: Patient: home Provider: office   I discussed the limitations of evaluation and management by telemedicine and the availability of in person appointments. The patient expressed understanding and agreed to proceed.    The patient was advised to call back or seek an in-person evaluation if the symptoms worsen or if the condition fails to improve as anticipated.  I provided 55 minutes of non-face-to-face time during this encounter.   Patient Details  Name: Alexa White MRN: [id number] Date of Birth: 02-Mar-1985 No data recorded  Encounter Date: 03/08/2022   OT End of Session - 03/09/22 0848     Visit Number 8    Number of Visits 20    Date for OT Re-Evaluation 03/30/22    OT Start Time 1200    OT Stop Time 1255    OT Time Calculation (min) 55 min             Past Medical History:  Diagnosis Date   Anemia    Anxiety    Depression    PTSD   Former smoker    Quit- 06/01/14   Gestational diabetes    Marijuana use 2016   Positive x4 prenatally    Past Surgical History:  Procedure Laterality Date   nose repair     broken nose- had to be reset   TUBAL LIGATION N/A 07/26/2018   Procedure: POST PARTUM TUBAL LIGATION;  Surgeon: Lesly Dukes, MD;  Location: [facility] LD [facility];  Service: Gynecology;  Laterality: N/A;   WISDOM TOOTH EXTRACTION  2011    There were no vitals filed for this visit.   Subjective Assessment - 03/09/22 0848     Pain Score 0-No pain               Group Session:  S: Felling better today  O: During today's group therapy session, titled  "The Science of Habits," the therapist led an educational and interactive discussion focused on the formation, identification, and impact of habits. The group explored both positive and negative habits, delving into how they can either enhance or impair functional performance in daily life. Key aspects of the session included: Habit Formation and Identification: The therapist provided a comprehensive overview of how habits are formed, emphasizing the cycle of cue, routine, and reward. The group was guided to identify their own habits and categorize them as positive or negative in terms of their impact on daily functioning. Neurological Underpinnings: A significant portion of the session was dedicated to understanding the neurological basis of habit formation, specifically focusing on the role of dopamine. The therapist explained how this neurotransmitter reinforces habits and contributes to the impulse to engage in habitual actions. Dysfunctional Habits: The group discussed the concept of dysfunction in habits, exploring how some habits, despite being reinforced by neurological pathways, can lead to negative outcomes in daily life. Personal experiences were shared, highlighting how certain habits have created challenges in personal and professional domains. Creating New Habits: The session culminated with techniques for developing new, healthier habits. The therapist introduced the concept of habit stacking, where a new habit is tied to an existing one,  making the adoption of healthy habits more manageable and effective. Throughout the session, participants were attentive and engaged, sharing insights and personal experiences related to habit formation and modification. The therapist utilized a variety of educational materials, including diagrams and real-life examples, to enhance understanding and facilitate interactive discussion.   A: The patient was present and attentive throughout the session,  demonstrating a good understanding of the material discussed. While they were less verbally active compared to some peers, their non-verbal cues, such as nodding and making eye contact, indicated active listening and processing of the information. The patient's passive participation might suggest a preference for absorbing information quietly or a need for more time to process before verbalizing thoughts. The attentive behavior suggests a level of engagement that could be beneficial if encouraged in future sessions, possibly through more direct prompts or one-on-one discussions to facilitate verbal participation. The patient's demeanor indicates a potential readiness to reflect on their habits and consider the application of the session's content in their daily life.   P: Continue to attend PHP OT group sessions 5x week for 4 weeks to promote daily structure, social engagement, and opportunities to develop and utilize adaptive strategies to maximize functional performance in preparation for safe transition and integration back into school, work, and the community. Plan to address topic of tbd in next OT group session.                    OT Education - 03/09/22 0848     Education Details The Science of Habits              OT Short Term Goals - 02/28/22 1944       OT SHORT TERM GOAL #1   Title Client will develop and utilize a personalized coping toolbox containing at least five coping strategies to manage challenging situations, demonstrating their use in real-life scenarios by the end of therapy.    Time 4    Period Weeks    Status On-going    Target Date 03/30/22      OT SHORT TERM GOAL #2   Title By discharge, client will demonstrate the ability to adapt and modify routines when faced with unexpected events or changes, maintaining a balanced approach to daily tasks.      OT SHORT TERM GOAL #3   Title By the time of discharge, client will independently set, track, and  make progress towards a long-term goal, demonstrating resilience in overcoming obstacles and seeking support when needed.                      Plan - 03/09/22 0848     Psychosocial Skills Coping Strategies;Habits;Interpersonal Interaction;Routines and Behaviors             Patient will benefit from skilled therapeutic intervention in order to improve the following deficits and impairments:       Psychosocial Skills: Coping Strategies, Habits, Interpersonal Interaction, Routines and Behaviors   Visit Diagnosis: Difficulty coping    Problem List Patient Active Problem List   Diagnosis Date Noted   PTSD (post-traumatic stress disorder) 02/21/2022   Alcohol use disorder 11/22/2021   GAD (generalized anxiety disorder) 09/20/2021   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[place]) 09/20/2021   Unintended weight loss 09/20/2021   DUB (dysfunctional uterine bleeding) 09/20/2021   Request for sterilization 07/27/2018   Delivery of viable fetus in abdominal pregnancy 07/26/2018   Normal labor and delivery 07/26/2018   GDM (gestational diabetes mellitus)  05/28/2018   Anxiety and depression 05/23/2018   Supervision of high-risk pregnancy, third trimester 02/20/2018    Brantley Stage, OT 03/09/2022, 8:49 AM  Cornell Barman, [place], [place], [address] Phone: [phone]   Fax:  [phone]  Name: Alexa White MRN: [id number] Date of Birth: 1985-03-23

## 2022-03-10 ENCOUNTER — Other Ambulatory Visit (HOSPITAL_COMMUNITY): Payer: BC Managed Care – PPO | Attending: Psychiatry

## 2022-03-10 ENCOUNTER — Other Ambulatory Visit (HOSPITAL_COMMUNITY): Payer: BC Managed Care – PPO | Attending: Psychiatry | Admitting: Licensed Clinical Social Worker

## 2022-03-10 DIAGNOSIS — F411 Generalized anxiety disorder: Secondary | ICD-10-CM

## 2022-03-10 DIAGNOSIS — R4589 Other symptoms and signs involving emotional state: Secondary | ICD-10-CM | POA: Diagnosis not present

## 2022-03-10 DIAGNOSIS — F319 Bipolar disorder, unspecified: Secondary | ICD-10-CM | POA: Insufficient documentation

## 2022-03-10 NOTE — Psych (Signed)
Virtual Visit via Video Note  I connected with Alexa White on 03/09/22 at  9:00 AM EST by a video enabled telemedicine application and verified that I am speaking with the correct person using two identifiers.  Location: Patient: patient home Provider: clinical home office   I discussed the limitations of evaluation and management by telemedicine and the availability of in person appointments. The patient expressed understanding and agreed to proceed.  I discussed the assessment and treatment plan with the patient. The patient was provided an opportunity to ask questions and all were answered. The patient agreed with the plan and demonstrated an understanding of the instructions.   The patient was advised to call back or seek an in-person evaluation if the symptoms worsen or if the condition fails to improve as anticipated.  Pt was provided 240 minutes of non-face-to-face time during this encounter.   Alexa Glass, LCSW   [facility] PHP THERAPIST PROGRESS NOTE  Alexa White [id number]  Session Time: 9:00 - 10:00  Participation Level: Active  Behavioral Response: CasualAlertDepressed  Type of Therapy: Group Therapy  Treatment Goals addressed: Coping  Progress Towards Goals: Progressing  Interventions: CBT, DBT, Supportive, and Reframing  Summary: Alexa White is a 37 y.o. female who presents with depression and trauma symptoms.  Clinician led check-in regarding current stressors and situation, and review of patient completed daily inventory. Clinician utilized active listening and empathetic response and validated patient emotions. Clinician facilitated processing group on pertinent issues.?    Therapist Response: Patient arrived within time allowed. Patient rates her mood at a 5 on a scale of 1-10 with 10 being best. Pt states she feels "aggravated." Pt states she slept 7 hours and ate 1x. Pt reports frustration with "stupid inconveniences" and that they trip her up so  much. Pt reports continued difficulty with utilizing distraction and coping to manage these irritants.  Patient able to process. Patient engaged in discussion.           Session Time: 10:00 am - 11:00 am   Participation Level: Active   Behavioral Response: CasualAlertDepressed   Type of Therapy: Group Therapy   Treatment Goals addressed: Coping   Progress Towards Goals: Progressing   Interventions: CBT, DBT, Solution Focused, Strength-based, Supportive, and Reframing   Therapist Response: [name] led processing group for pt's current struggles. Group members shared stressors and provided support and feedback. [name] brought in topics of boundaries, healthy relationships, and unhealthy thought processes to inform discussion.    Therapist Response: Pt able to process and provide support to group.          Session Time: 11:00 -12:00   Participation Level: Active   Behavioral Response: CasualAlertDepressed   Type of Therapy: Group Therapy   Treatment Goals addressed: Coping   Progress Towards Goals: Progressing   Interventions: CBT, DBT, Solution Focused, Strength-based, Supportive, and Reframing   Summary: [name] led discussion on communication struggles. Group shared ways in which communication is difficult for them and what are typical barriers. [name] reminded pt's of assertiveness skills and "I" statements. [name] encouraged pt's to consider making notes for points they want to address in a discussion and/or script out what they want to say as a way to decrease anxiety and likeliness that they will get off topic.    Therapist Response: Pt engaged in discussion and reports willingness to apply communication strategies           Session Time: 12:00 -1:00   Participation Level: Active   Behavioral Response:  CasualAlertDepressed   Type of Therapy: Group therapy   Treatment Goals addressed: Coping   Progress Towards Goals: Progressing   Interventions: Supportive;  Psychoeducation    Summary: 12:00 - 12:50: Occupational Therapy group led by cln E. Hollan.  12:50 - 1:00 Clinician assessed for immediate needs, medication compliance and efficacy, and safety concerns.   Therapist Response: 12:00 - 12:50: See OT note 12:50 - 1:00 pm: At check-out, patient reports no immediate concerns. Patient demonstrates progress as evidenced by continued engagement in group and responsiveness to treatment. Patient denies SI/HI/self-harm thoughts at the end of group.    Suicidal/Homicidal: Nowithout intent/plan  Plan: Pt will continue in PHP while working to decrease depression and trauma symptoms, increase emotion regulation, and increase ability to manage symptoms in a healthy manner.   Collaboration of Care: Medication Management AEB A Pashayan  Patient/Guardian was advised Release of Information must be obtained prior to any record release in order to collaborate their care with an outside provider. Patient/Guardian was advised if they have not already done so to contact the registration department to sign all necessary forms in order for [place] to release information regarding their care.   Consent: Patient/Guardian gives verbal consent for treatment and assignment of benefits for services provided during this visit. Patient/Guardian expressed understanding and agreed to proceed.   Diagnosis: Bipolar I disorder ([place]) [F31.9]    1. Bipolar I disorder ([place])   2. GAD (generalized anxiety disorder)   3. PTSD (post-traumatic stress disorder)       Alexa Glass, LCSW 03/10/2022

## 2022-03-10 NOTE — Psych (Signed)
Virtual Visit via Video Note  I connected with Alexa White on 03/08/22 at  9:00 AM EST by a video enabled telemedicine application and verified that I am speaking with the correct person using two identifiers.  Location: Patient: patient home Provider: clinical home office   I discussed the limitations of evaluation and management by telemedicine and the availability of in person appointments. The patient expressed understanding and agreed to proceed.  I discussed the assessment and treatment plan with the patient. The patient was provided an opportunity to ask questions and all were answered. The patient agreed with the plan and demonstrated an understanding of the instructions.   The patient was advised to call back or seek an in-person evaluation if the symptoms worsen or if the condition fails to improve as anticipated.  Pt was provided 240 minutes of non-face-to-face time during this encounter.   Lorin Glass, LCSW   [facility] PHP THERAPIST PROGRESS NOTE  Emonie Espericueta [id number]  Session Time: 9:00 - 10:00  Participation Level: Active  Behavioral Response: CasualAlertDepressed  Type of Therapy: Group Therapy  Treatment Goals addressed: Coping  Progress Towards Goals: Progressing  Interventions: CBT, DBT, Supportive, and Reframing  Summary: Alexa White is a 37 y.o. female who presents with depression and trauma symptoms.  Clinician led check-in regarding current stressors and situation, and review of patient completed daily inventory. Clinician utilized active listening and empathetic response and validated patient emotions. Clinician facilitated processing group on pertinent issues.?    Therapist Response: Patient arrived within time allowed. Patient rates her mood at a 7 on a scale of 1-10 with 10 being best. Pt states she feels "okay." Pt states she slept 7 hours and ate 1x. Pt reports the morning's continue to be mainly chaotic which she struggles to redirect.  Pt reports speaking to her sister-in-law which felt good and increased hope and connection. Patient able to process. Patient engaged in discussion.           Session Time: 10:00 am - 11:00 am   Participation Level: Active   Behavioral Response: CasualAlertDepressed   Type of Therapy: Group Therapy   Treatment Goals addressed: Coping   Progress Towards Goals: Progressing   Interventions: CBT, DBT, Solution Focused, Strength-based, Supportive, and Reframing   Therapist Response: [name] led processing group for pt's current struggles. Group members shared stressors and provided support and feedback. [name] brought in topics of boundaries, healthy relationships, and unhealthy thought processes to inform discussion.    Therapist Response:  Pt able to process and provide support to group.          Session Time: 11:00 -12:00   Participation Level: Active   Behavioral Response: CasualAlertDepressed   Type of Therapy: Group Therapy, Spiritual Care   Treatment Goals addressed: Coping   Progress Towards Goals: Progressing   Interventions: Supportive, Education   Summary:  Alexa White, Chaplain, led group.   Therapist Response: Pt participated         Session Time: 12:00 -1:00   Participation Level: Active   Behavioral Response: CasualAlertDepressed   Type of Therapy: Group therapy   Treatment Goals addressed: Coping   Progress Towards Goals: Progressing   Interventions: Supportive; Psychoeducation    Summary: 12:00 - 12:50: Occupational Therapy group led by cln E. Hollan.  12:50 - 1:00 Clinician assessed for immediate needs, medication compliance and efficacy, and safety concerns.   Therapist Response: 12:00 - 12:50: See OT note 12:50 - 1:00 pm: At check-out, patient reports no immediate concerns.  Patient demonstrates progress as evidenced by continued engagement in group and responsiveness to treatment. Patient denies SI/HI/self-harm thoughts at the end of  group.    Suicidal/Homicidal: Nowithout intent/plan  Plan: Pt will continue in PHP while working to decrease depression and trauma symptoms, increase emotion regulation, and increase ability to manage symptoms in a healthy manner.   Collaboration of Care: Medication Management AEB A Pashayan  Patient/Guardian was advised Release of Information must be obtained prior to any record release in order to collaborate their care with an outside provider. Patient/Guardian was advised if they have not already done so to contact the registration department to sign all necessary forms in order for Alexa White to release information regarding their care.   Consent: Patient/Guardian gives verbal consent for treatment and assignment of benefits for services provided during this visit. Patient/Guardian expressed understanding and agreed to proceed.   Diagnosis: Bipolar I disorder ([place]) [F31.9]    1. Bipolar I disorder ([place])   2. GAD (generalized anxiety disorder)   3. PTSD (post-traumatic stress disorder)       Lorin Glass, LCSW 03/10/2022

## 2022-03-13 ENCOUNTER — Encounter (HOSPITAL_COMMUNITY): Payer: Self-pay

## 2022-03-13 ENCOUNTER — Other Ambulatory Visit (HOSPITAL_COMMUNITY): Payer: BC Managed Care – PPO | Attending: Psychiatry | Admitting: Licensed Clinical Social Worker

## 2022-03-13 ENCOUNTER — Other Ambulatory Visit (HOSPITAL_COMMUNITY): Payer: BC Managed Care – PPO | Attending: Psychiatry

## 2022-03-13 DIAGNOSIS — Z7389 Other problems related to life management difficulty: Secondary | ICD-10-CM | POA: Diagnosis not present

## 2022-03-13 DIAGNOSIS — F4310 Post-traumatic stress disorder, unspecified: Secondary | ICD-10-CM | POA: Diagnosis not present

## 2022-03-13 DIAGNOSIS — F319 Bipolar disorder, unspecified: Secondary | ICD-10-CM | POA: Diagnosis not present

## 2022-03-13 DIAGNOSIS — F411 Generalized anxiety disorder: Secondary | ICD-10-CM | POA: Insufficient documentation

## 2022-03-13 DIAGNOSIS — R4589 Other symptoms and signs involving emotional state: Secondary | ICD-10-CM

## 2022-03-13 NOTE — Therapy (Signed)
[place], [place], [address] Phone: [phone]   Fax:  [phone]  Occupational Therapy Treatment Virtual Visit via Video Note  I connected with Alexa White on 03/13/22 at  8:00 AM EST by a video enabled telemedicine application and verified that I am speaking with the correct person using two identifiers.  Location: Patient: home Provider: office   I discussed the limitations of evaluation and management by telemedicine and the availability of in person appointments. The patient expressed understanding and agreed to proceed.    The patient was advised to call back or seek an in-person evaluation if the symptoms worsen or if the condition fails to improve as anticipated.  I provided 55 minutes of non-face-to-face time during this encounter.  Patient Details  Name: Alexa White MRN: [id number] Date of Birth: August 27, 1985 No data recorded  Encounter Date: 03/10/2022   OT End of Session - 03/13/22 1926     Visit Number 10    Number of Visits 20    Date for OT Re-Evaluation 03/30/22    OT Start Time 1200    OT Stop Time 1255    OT Time Calculation (min) 55 min             Past Medical History:  Diagnosis Date   Anemia    Anxiety    Depression    PTSD   Former smoker    Quit- 06/01/14   Gestational diabetes    Marijuana use 2016   Positive x4 prenatally    Past Surgical History:  Procedure Laterality Date   nose repair     broken nose- had to be reset   TUBAL LIGATION N/A 07/26/2018   Procedure: POST PARTUM TUBAL LIGATION;  Surgeon: Guss Bunde, MD;  Location: [facility] LD [facility]S;  Service: Gynecology;  Laterality: N/A;   WISDOM TOOTH EXTRACTION  2011    There were no vitals filed for this visit.   Subjective Assessment - 03/13/22 1925     Currently in Pain? No/denies    Pain Score 0-No pain               Group Session:  S: Feeling better today.   O: During  today's group therapy session, titled "The Science of Habits," the therapist led an educational and interactive discussion focused on the formation, identification, and impact of habits. The group explored both positive and negative habits, delving into how they can either enhance or impair functional performance in daily life. Key aspects of the session included: Habit Formation and Identification: The therapist provided a comprehensive overview of how habits are formed, emphasizing the cycle of cue, routine, and reward. The group was guided to identify their own habits and categorize them as positive or negative in terms of their impact on daily functioning. Neurological Underpinnings: A significant portion of the session was dedicated to understanding the neurological basis of habit formation, specifically focusing on the role of dopamine. The therapist explained how this neurotransmitter reinforces habits and contributes to the impulse to engage in habitual actions. Dysfunctional Habits: The group discussed the concept of dysfunction in habits, exploring how some habits, despite being reinforced by neurological pathways, can lead to negative outcomes in daily life. Personal experiences were shared, highlighting how certain habits have created challenges in personal and professional domains. Creating New Habits: The session culminated with techniques for developing new, healthier habits. The therapist introduced the concept of habit stacking, where a new  habit is tied to an existing one, making the adoption of healthy habits more manageable and effective. Throughout the session, participants were attentive and engaged, sharing insights and personal experiences related to habit formation and modification. The therapist utilized a variety of educational materials, including diagrams and real-life examples, to enhance understanding and facilitate interactive discussion.   A: The patient was present and  attentive throughout the session, demonstrating a good understanding of the material discussed. While they were less verbally active compared to some peers, their non-verbal cues, such as nodding and making eye contact, indicated active listening and processing of the information. The patient's passive participation might suggest a preference for absorbing information quietly or a need for more time to process before verbalizing thoughts. The attentive behavior suggests a level of engagement that could be beneficial if encouraged in future sessions, possibly through more direct prompts or one-on-one discussions to facilitate verbal participation. The patient's demeanor indicates a potential readiness to reflect on their habits and consider the application of the session's content in their daily life.   P: Continue to attend PHP OT group sessions 5x week for 4 weeks to promote daily structure, social engagement, and opportunities to develop and utilize adaptive strategies to maximize functional performance in preparation for safe transition and integration back into school, work, and the community. Plan to address topic of Goals in next OT group session.                    OT Education - 03/13/22 1925     Education Details The Science of Habits pt 3              OT Short Term Goals - 02/28/22 1944       OT SHORT TERM GOAL #1   Title Client will develop and utilize a personalized coping toolbox containing at least five coping strategies to manage challenging situations, demonstrating their use in real-life scenarios by the end of therapy.    Time 4    Period Weeks    Status On-going    Target Date 03/30/22      OT SHORT TERM GOAL #2   Title By discharge, client will demonstrate the ability to adapt and modify routines when faced with unexpected events or changes, maintaining a balanced approach to daily tasks.      OT SHORT TERM GOAL #3   Title By the time of discharge,  client will independently set, track, and make progress towards a long-term goal, demonstrating resilience in overcoming obstacles and seeking support when needed.                      Plan - 03/13/22 1926     Occupational performance deficits (Please refer to evaluation for details): IADL's;Leisure;Rest and Sleep;Social Participation;Work    Environmental health practitioner;Habits;Interpersonal Interaction;Routines and Behaviors             Patient will benefit from skilled therapeutic intervention in order to improve the following deficits and impairments:       Psychosocial Skills: Coping Strategies, Habits, Interpersonal Interaction, Routines and Behaviors   Visit Diagnosis: Difficulty coping    Problem List Patient Active Problem List   Diagnosis Date Noted   PTSD (post-traumatic stress disorder) 02/21/2022   Alcohol use disorder 11/22/2021   GAD (generalized anxiety disorder) 09/20/2021   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   Unintended weight loss 09/20/2021   DUB (dysfunctional uterine bleeding) 09/20/2021   Request for  sterilization 07/27/2018   Delivery of viable fetus in abdominal pregnancy 07/26/2018   Normal labor and delivery 07/26/2018   GDM (gestational diabetes mellitus) 05/28/2018   Anxiety and depression 05/23/2018   Supervision of high-risk pregnancy, third trimester 02/20/2018    Brantley Stage, OT 03/13/2022, 7:26 PM  [facility]ell Barman, [place] [facility] [place], [place], [address] Phone: [phone]   Fax:  [phone]  Name: Alexa White MRN: [id number] Date of Birth: [birth year]

## 2022-03-13 NOTE — Therapy (Signed)
[place], [place], [address] Phone: [phone]   Fax:  [phone]  Occupational Therapy Treatment Virtual Visit via Video Note  I connected with Alexa White on 03/13/22 at  8:00 AM EST by a video enabled telemedicine application and verified that I am speaking with the correct person using two identifiers.  Location: Patient: home Provider: office   I discussed the limitations of evaluation and management by telemedicine and the availability of in person appointments. The patient expressed understanding and agreed to proceed.    The patient was advised to call back or seek an in-person evaluation if the symptoms worsen or if the condition fails to improve as anticipated.  I provided 55 minutes of non-face-to-face time during this encounter.   Patient Details  Name: Alexa White MRN: [id number] Date of Birth: 1985-12-28 No data recorded  Encounter Date: 03/13/2022   OT End of Session - 03/13/22 1942     Visit Number 11    Number of Visits 20    Date for OT Re-Evaluation 03/30/22    OT Start Time 1200    OT Stop Time 1255    OT Time Calculation (min) 55 min             Past Medical History:  Diagnosis Date   Anemia    Anxiety    Depression    PTSD   Former smoker    Quit- 06/01/14   Gestational diabetes    Marijuana use 2016   Positive x4 prenatally    Past Surgical History:  Procedure Laterality Date   nose repair     broken nose- had to be reset   TUBAL LIGATION N/A 07/26/2018   Procedure: POST PARTUM TUBAL LIGATION;  Surgeon: Guss Bunde, MD;  Location: [facility] LD ORS;  Service: Gynecology;  Laterality: N/A;   WISDOM TOOTH EXTRACTION  2011    There were no vitals filed for this visit.   Subjective Assessment - 03/13/22 1942     Currently in Pain? No/denies    Pain Score 0-No pain                 Group Session:  S: Doing better today.   O: During  today's OT group session, the patient participated in an educational segment about the importance of goal-setting and the application of the SMART framework to enhance daily life, particularly focusing on ADLs and iADLs. The session began with five open-ended pre-session questions that facilitated group discussion and introspection about their current relationship with goals. Following the introduction and educational segment, participants engaged in brainstorming and group discussions to devise hypothetical SMART goals. The session concluded with five post-session questions to reinforce understanding and facilitate reflection. Throughout the session, there was a range of engagement levels noted among the participants.   A:  Patient demonstrated a high level of engagement throughout the session. They actively participated in discussions, sharing personal experiences related to goal setting and challenges faced. Patient was able to clearly articulate an understanding of the SMART framework and proposed personal SMART goals related to their own ADLs with minimal assistance. They expressed enthusiasm about applying what they learned to their daily routine and appeared motivated to make changes.    P: Continue to attend PHP OT group sessions 5x week for 4 weeks to promote daily structure, social engagement, and opportunities to develop and utilize adaptive strategies to maximize functional performance in preparation for safe transition and  integration back into school, work, and the community. Plan to address topic of goals pt 2 in next OT group session.                  OT Education - 03/13/22 1942     Education Details SMART Goals 1              OT Short Term Goals - 02/28/22 1944       OT SHORT TERM GOAL #1   Title Client will develop and utilize a personalized coping toolbox containing at least five coping strategies to manage challenging situations, demonstrating their use in  real-life scenarios by the end of therapy.    Time 4    Period Weeks    Status On-going    Target Date 03/30/22      OT SHORT TERM GOAL #2   Title By discharge, client will demonstrate the ability to adapt and modify routines when faced with unexpected events or changes, maintaining a balanced approach to daily tasks.      OT SHORT TERM GOAL #3   Title By the time of discharge, client will independently set, track, and make progress towards a long-term goal, demonstrating resilience in overcoming obstacles and seeking support when needed.                      Plan - 03/13/22 1942     Psychosocial Skills Coping Strategies;Habits;Interpersonal Interaction;Routines and Behaviors             Patient will benefit from skilled therapeutic intervention in order to improve the following deficits and impairments:       Psychosocial Skills: Coping Strategies, Habits, Interpersonal Interaction, Routines and Behaviors   Visit Diagnosis: Difficulty coping    Problem List Patient Active Problem List   Diagnosis Date Noted   PTSD (post-traumatic stress disorder) 02/21/2022   Alcohol use disorder 11/22/2021   GAD (generalized anxiety disorder) 09/20/2021   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[place]) 09/20/2021   Unintended weight loss 09/20/2021   DUB (dysfunctional uterine bleeding) 09/20/2021   Request for sterilization 07/27/2018   Delivery of viable fetus in abdominal pregnancy 07/26/2018   Normal labor and delivery 07/26/2018   GDM (gestational diabetes mellitus) 05/28/2018   Anxiety and depression 05/23/2018   Supervision of high-risk pregnancy, third trimester 02/20/2018    Brantley Stage, OT 03/13/2022, 7:43 PM  Cornell Barman, [place] [facility] [place], [place], [address] Phone: [phone]   Fax:  [phone]  Name: Alexa White MRN: [id number] Date of Birth: 10-24-1985

## 2022-03-14 ENCOUNTER — Encounter (HOSPITAL_COMMUNITY): Payer: Self-pay

## 2022-03-14 ENCOUNTER — Other Ambulatory Visit (HOSPITAL_COMMUNITY): Payer: BC Managed Care – PPO | Attending: Psychiatry

## 2022-03-14 ENCOUNTER — Other Ambulatory Visit (HOSPITAL_COMMUNITY): Payer: BC Managed Care – PPO | Attending: Psychiatry | Admitting: Licensed Clinical Social Worker

## 2022-03-14 DIAGNOSIS — F319 Bipolar disorder, unspecified: Secondary | ICD-10-CM | POA: Insufficient documentation

## 2022-03-14 DIAGNOSIS — R4589 Other symptoms and signs involving emotional state: Secondary | ICD-10-CM | POA: Diagnosis not present

## 2022-03-14 DIAGNOSIS — F411 Generalized anxiety disorder: Secondary | ICD-10-CM | POA: Insufficient documentation

## 2022-03-14 NOTE — Therapy (Signed)
[facility] [place], [place], [address] Phone: [phone]   Fax:  [phone]  Occupational Therapy Treatment Virtual Visit via Video Note  I connected with Alexa White on 03/14/22 at  8:00 AM EST by a video enabled telemedicine application and verified that I am speaking with the correct person using two identifiers.  Location: Patient: home Provider: office   I discussed the limitations of evaluation and management by telemedicine and the availability of in person appointments. The patient expressed understanding and agreed to proceed.    The patient was advised to call back or seek an in-person evaluation if the symptoms worsen or if the condition fails to improve as anticipated.  I provided 55 minutes of non-face-to-face time during this encounter.   Patient Details  Name: Alexa White MRN: [id number] Date of Birth: 02-20-1985 No data recorded  Encounter Date: 03/14/2022   OT End of Session - 03/14/22 2011     Visit Number 12    Number of Visits 20    Date for OT Re-Evaluation 03/30/22    OT Start Time 1200    OT Stop Time 1255    OT Time Calculation (min) 55 min    Activity Tolerance Patient tolerated treatment well             Past Medical History:  Diagnosis Date   Anemia    Anxiety    Depression    PTSD   Former smoker    Quit- 06/01/14   Gestational diabetes    Marijuana use 2016   Positive x4 prenatally    Past Surgical History:  Procedure Laterality Date   nose repair     broken nose- had to be reset   TUBAL LIGATION N/A 07/26/2018   Procedure: POST PARTUM TUBAL LIGATION;  Surgeon: Guss Bunde, MD;  Location: [facility] LD [facility]S;  Service: Gynecology;  Laterality: N/A;   WISDOM TOOTH EXTRACTION  2011    There were no vitals filed for this visit.   Subjective Assessment - 03/14/22 2010     Currently in Pain? No/denies    Pain Score 0-No pain                Group Session:  S: Doing a bit better today  O: During today's OT group session, the patient participated in an educational segment about the importance of goal-setting and the application of the SMART framework to enhance daily life, particularly focusing on ADLs and iADLs. The session began with five open-ended pre-session questions that facilitated group discussion and introspection about their current relationship with goals. Following the introduction and educational segment, participants engaged in brainstorming and group discussions to devise hypothetical SMART goals. The session concluded with five post-session questions to reinforce understanding and facilitate reflection. Throughout the session, there was a range of engagement levels noted among the participants.   A:  Patient demonstrated a high level of engagement throughout the session. They actively participated in discussions, sharing personal experiences related to goal setting and challenges faced. Patient was able to clearly articulate an understanding of the SMART framework and proposed personal SMART goals related to their own ADLs with minimal assistance. They expressed enthusiasm about applying what they learned to their daily routine and appeared motivated to make changes.   P: Continue to attend PHP OT group sessions 5x week for 4 weeks to promote daily structure, social engagement, and opportunities to develop and utilize adaptive strategies to maximize functional  performance in preparation for safe transition and integration back into school, work, and the community. Plan to address topic of pt 3 in next OT group session.                    OT Education - 03/14/22 2010     Education Details SMART Goals 2              OT Short Term Goals - 02/28/22 1944       OT SHORT TERM GOAL #1   Title Client will develop and utilize a personalized coping toolbox containing at least five coping  strategies to manage challenging situations, demonstrating their use in real-life scenarios by the end of therapy.    Time 4    Period Weeks    Status On-going    Target Date 03/30/22      OT SHORT TERM GOAL #2   Title By discharge, client will demonstrate the ability to adapt and modify routines when faced with unexpected events or changes, maintaining a balanced approach to daily tasks.      OT SHORT TERM GOAL #3   Title By the time of discharge, client will independently set, track, and make progress towards a long-term goal, demonstrating resilience in overcoming obstacles and seeking support when needed.                      Plan - 03/14/22 2011     Psychosocial Skills Coping Strategies;Habits;Interpersonal Interaction;Routines and Behaviors             Patient will benefit from skilled therapeutic intervention in order to improve the following deficits and impairments:       Psychosocial Skills: Coping Strategies, Habits, Interpersonal Interaction, Routines and Behaviors   Visit Diagnosis: Difficulty coping    Problem List Patient Active Problem List   Diagnosis Date Noted   PTSD (post-traumatic stress disorder) 02/21/2022   Alcohol use disorder 11/22/2021   GAD (generalized anxiety disorder) 09/20/2021   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[place]) 09/20/2021   Unintended weight loss 09/20/2021   DUB (dysfunctional uterine bleeding) 09/20/2021   Request for sterilization 07/27/2018   Delivery of viable fetus in abdominal pregnancy 07/26/2018   Normal labor and delivery 07/26/2018   GDM (gestational diabetes mellitus) 05/28/2018   Anxiety and depression 05/23/2018   Supervision of high-risk pregnancy, third trimester 02/20/2018    Brantley Stage, OT 03/14/2022, 8:11 PM  [facility], [address] [place], [place], [address] Phone: [phone]   Fax:  [phone]  Name:  Alexa White MRN: [id number] Date of Birth: October 11, 1985

## 2022-03-14 NOTE — Psych (Signed)
Virtual Visit via Video Note  I connected with Alexa White on 03/10/22 at  9:00 AM EST by a video enabled telemedicine application and verified that I am speaking with the correct person using two identifiers.  Location: Patient: patient home Provider: clinical home office   I discussed the limitations of evaluation and management by telemedicine and the availability of in person appointments. The patient expressed understanding and agreed to proceed.  I discussed the assessment and treatment plan with the patient. The patient was provided an opportunity to ask questions and all were answered. The patient agreed with the plan and demonstrated an understanding of the instructions.   The patient was advised to call back or seek an in-person evaluation if the symptoms worsen or if the condition fails to improve as anticipated.  Pt was provided 240 minutes of non-face-to-face time during this encounter.   Lorin Glass, LCSW   [facility] PHP THERAPIST PROGRESS NOTE  Alexa White [id number]  Session Time: 9:00 - 10:00  Participation Level: Active  Behavioral Response: CasualAlertDepressed  Type of Therapy: Group Therapy  Treatment Goals addressed: Coping  Progress Towards Goals: Progressing  Interventions: CBT, DBT, Supportive, and Reframing  Summary: Alexa White is a 37 y.o. female who presents with depression and trauma symptoms.  Clinician led check-in regarding current stressors and situation, and review of patient completed daily inventory. Clinician utilized active listening and empathetic response and validated patient emotions. Clinician facilitated processing group on pertinent issues.?    Therapist Response: Patient arrived within time allowed. Patient rates her mood at a 7 on a scale of 1-10 with 10 being best. Pt states she feels "relaxed." Pt states she slept 5 hours broken because her son was ill, and ate 1x. Pt reports yesterday was "normal." Pt states she took  2 shots of liquor before her husband came home because she didn't want to "deal with his negativity." [name] discussed the pros/cons of using alcohol as a coping skill and encouraged pt not to rely on alcohol. Patient able to process. Patient engaged in discussion.           Session Time: 10:00 am - 11:00 am   Participation Level: Active   Behavioral Response: CasualAlertDepressed   Type of Therapy: Group Therapy   Treatment Goals addressed: Coping   Progress Towards Goals: Progressing   Interventions: CBT, DBT, Solution Focused, Strength-based, Supportive, and Reframing   Therapist Response: [name] led processing group for pt's current struggles. Group members shared stressors and provided support and feedback. [name] brought in topics of boundaries, healthy relationships, and unhealthy thought processes to inform discussion.    Therapist Response: Pt able to process and provide support to group.          Session Time: 11:00 -12:00   Participation Level: Active   Behavioral Response: CasualAlertDepressed   Type of Therapy: Group Therapy   Treatment Goals addressed: Coping   Progress Towards Goals: Progressing   Interventions: CBT, DBT, Solution Focused, Strength-based, Supportive, and Reframing   Summary: [name] continued discussion on communication and utilized hand out "[employer]" to discuss ways to communicate in difficult or delicate situations. Group discussed the strategies and which were most problematic for them. [name] shaped discussion and provided clarity and guidance in how to apply.    Therapist Response: Pt engaged in discussion.          Session Time: 12:00 -1:00   Participation Level: Active   Behavioral Response: CasualAlertDepressed   Type of Therapy: Group  therapy   Treatment Goals addressed: Coping   Progress Towards Goals: Progressing   Interventions: Supportive; Psychoeducation    Summary: 12:00 - 12:50: Occupational Therapy group led by  cln E. Hollan.  12:50 - 1:00 Clinician assessed for immediate needs, medication compliance and efficacy, and safety concerns.   Therapist Response: 12:00 - 12:50: See OT note 12:50 - 1:00 pm: At check-out, patient reports no immediate concerns. Patient demonstrates progress as evidenced by continued engagement in group and responsiveness to treatment. Patient denies SI/HI/self-harm thoughts at the end of group.    Suicidal/Homicidal: Nowithout intent/plan  Plan: Pt will continue in PHP while working to decrease depression and trauma symptoms, increase emotion regulation, and increase ability to manage symptoms in a healthy manner.   Collaboration of Care: Medication Management AEB A Pashayan  Patient/Guardian was advised Release of Information must be obtained prior to any record release in order to collaborate their care with an outside provider. Patient/Guardian was advised if they have not already done so to contact the registration department to sign all necessary forms in order for [place] to release information regarding their care.   Consent: Patient/Guardian gives verbal consent for treatment and assignment of benefits for services provided during this visit. Patient/Guardian expressed understanding and agreed to proceed.   Diagnosis: Bipolar I disorder ([place]) [F31.9]    1. Bipolar I disorder ([place])   2. GAD (generalized anxiety disorder)       Lorin Glass, LCSW 03/14/2022

## 2022-03-15 ENCOUNTER — Other Ambulatory Visit (HOSPITAL_COMMUNITY): Payer: BC Managed Care – PPO | Attending: Psychiatry | Admitting: Licensed Clinical Social Worker

## 2022-03-15 ENCOUNTER — Other Ambulatory Visit (HOSPITAL_COMMUNITY): Payer: BC Managed Care – PPO | Attending: Psychiatry

## 2022-03-15 ENCOUNTER — Telehealth (HOSPITAL_COMMUNITY): Payer: Self-pay | Admitting: Psychiatry

## 2022-03-15 ENCOUNTER — Telehealth (HOSPITAL_COMMUNITY): Payer: BC Managed Care – PPO | Admitting: Psychiatry

## 2022-03-15 DIAGNOSIS — F411 Generalized anxiety disorder: Secondary | ICD-10-CM | POA: Diagnosis not present

## 2022-03-15 DIAGNOSIS — F4310 Post-traumatic stress disorder, unspecified: Secondary | ICD-10-CM | POA: Diagnosis not present

## 2022-03-15 DIAGNOSIS — F319 Bipolar disorder, unspecified: Secondary | ICD-10-CM | POA: Insufficient documentation

## 2022-03-15 DIAGNOSIS — R4589 Other symptoms and signs involving emotional state: Secondary | ICD-10-CM

## 2022-03-15 NOTE — Telephone Encounter (Signed)
D:  Patient will be stepping down to [facility] from [facility] on 03-21-22.  A:  Placed call to orient pt and answer all her questions.  Pt mentioned that she wanted to inform cm that she will be accessing the group link with her phone.  "I will have my video on but there may be times you may not see me because I have a 37 yr old that may be running around or I may be picking my skin; but if you ask me a question I will get back into the camera so I can answer the question."  Reiterated group expectations with pt.  Strongly encouraged pt to try and keep distractions to a minimal, so she can remain in the group.  Inform [name], LCSW, LCAS (group facilitator).  R:  Pt receptive.

## 2022-03-16 ENCOUNTER — Encounter (HOSPITAL_COMMUNITY): Payer: Self-pay

## 2022-03-16 ENCOUNTER — Ambulatory Visit: Payer: BC Managed Care – PPO | Admitting: Internal Medicine

## 2022-03-16 ENCOUNTER — Other Ambulatory Visit (HOSPITAL_COMMUNITY): Payer: BC Managed Care – PPO | Attending: Psychiatry | Admitting: Licensed Clinical Social Worker

## 2022-03-16 ENCOUNTER — Other Ambulatory Visit (HOSPITAL_COMMUNITY): Payer: BC Managed Care – PPO | Attending: Psychiatry

## 2022-03-16 DIAGNOSIS — Z79899 Other long term (current) drug therapy: Secondary | ICD-10-CM | POA: Diagnosis not present

## 2022-03-16 DIAGNOSIS — F4310 Post-traumatic stress disorder, unspecified: Secondary | ICD-10-CM | POA: Diagnosis not present

## 2022-03-16 DIAGNOSIS — F429 Obsessive-compulsive disorder, unspecified: Secondary | ICD-10-CM | POA: Insufficient documentation

## 2022-03-16 DIAGNOSIS — F411 Generalized anxiety disorder: Secondary | ICD-10-CM | POA: Insufficient documentation

## 2022-03-16 DIAGNOSIS — Z9152 Personal history of nonsuicidal self-harm: Secondary | ICD-10-CM | POA: Insufficient documentation

## 2022-03-16 DIAGNOSIS — F319 Bipolar disorder, unspecified: Secondary | ICD-10-CM | POA: Diagnosis not present

## 2022-03-16 DIAGNOSIS — R4589 Other symptoms and signs involving emotional state: Secondary | ICD-10-CM

## 2022-03-16 NOTE — Psych (Signed)
Virtual Visit via Video Note  I connected with Alexa White on 03/13/22 at  9:00 AM EST by a video enabled telemedicine application and verified that I am speaking with the correct person using two identifiers.  Location: Patient: patient home Provider: clinical home office   I discussed the limitations of evaluation and management by telemedicine and the availability of in person appointments. The patient expressed understanding and agreed to proceed.  I discussed the assessment and treatment plan with the patient. The patient was provided an opportunity to ask questions and all were answered. The patient agreed with the plan and demonstrated an understanding of the instructions.   The patient was advised to call back or seek an in-person evaluation if the symptoms worsen or if the condition fails to improve as anticipated.  Pt was provided 240 minutes of non-face-to-face time during this encounter.   Lorin Glass, LCSW   [facility] PHP THERAPIST PROGRESS NOTE  Alexa White [id number]  Session Time: 9:00 - 10:00  Participation Level: Active  Behavioral Response: CasualAlertDepressed  Type of Therapy: Group Therapy  Treatment Goals addressed: Coping  Progress Towards Goals: Progressing  Interventions: CBT, DBT, Supportive, and Reframing  Summary: Alexa White is a 37 y.o. female who presents with depression and trauma symptoms.  Clinician led check-in regarding current stressors and situation, and review of patient completed daily inventory. Clinician utilized active listening and empathetic response and validated patient emotions. Clinician facilitated processing group on pertinent issues.?    Therapist Response: Patient arrived within time allowed. Patient rates her mood at a 5 on a scale of 1-10 with 10 being best. Pt states she feels "okay." Pt states she slept 5 hours and ate 1x. Pt reports speaking with her mother yesterday which was upsetting and triggering. Pt  reports struggling with rumination afterwards and having difficulty regulating emotions.  Patient able to process. Patient engaged in discussion.           Session Time: 10:00 am - 11:00 am   Participation Level: Active   Behavioral Response: CasualAlertDepressed   Type of Therapy: Group Therapy   Treatment Goals addressed: Coping   Progress Towards Goals: Progressing   Interventions: CBT, DBT, Solution Focused, Strength-based, Supportive, and Reframing   Therapist Response: [name] led processing group for pt's current struggles. Group members shared stressors and provided support and feedback. [name] brought in topics of boundaries, healthy relationships, and unhealthy thought processes to inform discussion.    Therapist Response: Pt able to process and provide support to group.           Session Time: 11:00 -12:00   Participation Level: Active   Behavioral Response: CasualAlertDepressed   Type of Therapy: Group Therapy   Treatment Goals addressed: Coping   Progress Towards Goals: Progressing   Interventions: CBT, DBT, Solution Focused, Strength-based, Supportive, and Reframing   Summary: [name] introduced topic of DBT distress tolerance skills. [name] provided context for distress tolerance skills and how to practice them. [name] introduced the ACCEPTS distraction skills and group discusses ways to utilize "A" activities.    Therapist Response: Pt engaged in discussion and is able to state ways to apply this skill.        Session Time: 12:00 -1:00   Participation Level: Active   Behavioral Response: CasualAlertDepressed   Type of Therapy: Group therapy   Treatment Goals addressed: Coping   Progress Towards Goals: Progressing   Interventions: Supportive; Psychoeducation    Summary: 12:00 - 12:50: Occupational Therapy group led by  cln E. Hollan.  12:50 - 1:00 Clinician assessed for immediate needs, medication compliance and efficacy, and safety concerns.   Therapist  Response: 12:00 - 12:50: See OT note 12:50 - 1:00 pm: At check-out, patient reports no immediate concerns. Patient demonstrates progress as evidenced by continued engagement in group and responsiveness to treatment. Patient denies SI/HI/self-harm thoughts at the end of group.    Suicidal/Homicidal: Nowithout intent/plan  Plan: Pt will continue in PHP while working to decrease depression and trauma symptoms, increase emotion regulation, and increase ability to manage symptoms in a healthy manner.   Collaboration of Care: Medication Management AEB A Pashayan  Patient/Guardian was advised Release of Information must be obtained prior to any record release in order to collaborate their care with an outside provider. Patient/Guardian was advised if they have not already done so to contact the registration department to sign all necessary forms in order for [place] to release information regarding their care.   Consent: Patient/Guardian gives verbal consent for treatment and assignment of benefits for services provided during this visit. Patient/Guardian expressed understanding and agreed to proceed.   Diagnosis: Bipolar I disorder ([place]) [F31.9]    1. Bipolar I disorder ([place])   2. GAD (generalized anxiety disorder)       Lorin Glass, LCSW 03/16/2022

## 2022-03-16 NOTE — Progress Notes (Signed)
[facility] Psych Discharge Summary   Virtual Visit via Video Note  I connected with Alexa White on 03/16/22 at  9:00 AM EST by a video enabled telemedicine application and verified that I am speaking with the correct person using two identifiers.  Location: Patient: Home Provider: [facility]   I discussed the limitations of evaluation and management by telemedicine and the availability of in person appointments. The patient expressed understanding and agreed to proceed.   Alexa White [id number]  Admission date: 02/27/2022 Discharge date: 03/17/2022  Reason for admission:  She reports that she has been dealing with depression and anxiety for a long time. She reports that she has also been diagnosed with PTSD and OCD. She reports that she had been on medications but they were not controlling her symptoms. She reports that she was recently diagnosed with bipolar disorder and told that she did have schizophrenic tendencies. She reports that she thinks people are following her and she feels like her coworkers are talking about/insulting her. She reports that it got so bad she wanted to quit her job and eventually that she did not want to be here anymore. She reports that these thoughts started approximately 1 year ago but thinks they may have been there longer than that but just became more apparent at that time.   Progress in Program Toward Treatment Goals: Progressing  Progress (rationale):  Alexa White is a 37 yr old female who presents via Virtual Video Visit for Follow Up and Medication Management, she enrolled in the [facility] on 02/27/2022.  PPHx is significant for Bipolar Disorder, GAD, PTSD, and OCD, and a remote History of Self Injurious Behavior (Cutting- last 10+ yrs ago), and no history of Suicide Attempts or Psychiatric Hospitalizations.     She reports that she is continuing to have issues with irritability.  She reports that she is implementing  her skills learned during the program so that when she is getting her kids ready in the morning she does not take that irritability out on them.  She reports her depression and anxiety are still significant.  She reports no side effects from starting of her medications.  She does have concerns about what happens when she goes back to work since that was her main stressor before starting the program.  Discussed with her that the difference will be both the medication and coping skills she has learned which will change her response to the stressors and she was reassured by this.  Discussed with her that we would continue to make medication changes and she would continue to learn more coping skills and refine the once she is already learned during the IOP program and she reports looking forward to that.  She reports her sleep is good.  She reports her appetite is fair.  She reports no SI, HI, or AVH.  She reports no other concerns at present.   Psychiatric Specialty Exam:   Review of Systems  Respiratory:  Negative for shortness of breath.   Cardiovascular:  Negative for chest pain.  Gastrointestinal:  Negative for abdominal pain, constipation, diarrhea, nausea and vomiting.  Neurological:  Negative for dizziness, weakness and headaches.  Psychiatric/Behavioral:  Positive for dysphoric mood. Negative for hallucinations, sleep disturbance and suicidal ideas. The patient is nervous/anxious.     There were no vitals taken for this visit.There is no height or weight on file to calculate BMI.  General Appearance: Casual and Fairly Groomed  Eye Contact:  Good  Speech:  Clear and Coherent and Normal Rate  Volume:  Normal  Mood:   "ok"  Affect:  Congruent  Thought Process:  Coherent and Goal Directed  Orientation:  Full (Time, Place, and Person)  Thought Content:  WDL and Logical  Suicidal Thoughts:  No  Homicidal Thoughts:  No  Memory:  Immediate;   Good Recent;   Good  Judgement:  Good  Insight:   Good  Psychomotor Activity:  Normal  Concentration:  Concentration: Good and Attention Span: Good  Recall:  Good  Fund of Knowledge:  Good  Language:  Good  Akathisia:  Negative  Handed:  Right  AIMS (if indicated):     Assets:  Communication Skills Desire for Improvement Housing Resilience Social Support  ADL's:  Intact  Cognition:  WNL  Sleep:   good      Bipolar Disorder, Depressed Severe  Anxiety  OCD  PTSD: -Continue Seroquel 200 mg QHS for mood stability.  No refills sent at this time. -Continue Zoloft 25 mg daily for depression, anxiety, OCD.  No refills sent at this time.   Discharge Plan: Referral to Behavioral Health Intensive Outpatient Program  Collaboration of Care: Other PHP Program and IOP Program  Patient/Guardian was advised Release of Information must be obtained prior to any record release in order to collaborate their care with an outside provider. Patient/Guardian was advised if they have not already done so to contact the registration department to sign all necessary forms in order for [place] to release information regarding their care.   Consent: Patient/Guardian gives verbal consent for treatment and assignment of benefits for services provided during this visit. Patient/Guardian expressed understanding and agreed to proceed.   [facility] 03/16/2022   Follow Up Instructions:    I discussed the assessment and treatment plan with the patient. The patient was provided an opportunity to ask questions and all were answered. The patient agreed with the plan and demonstrated an understanding of the instructions.   The patient was advised to call back or seek an in-person evaluation if the symptoms worsen or if the condition fails to improve as anticipated.  I provided 15 minutes of non-face-to-face time during this encounter.   Briant Cedar, MD

## 2022-03-16 NOTE — Therapy (Signed)
[name], [place], [address] Phone: [phone]   Fax:  [phone]  Occupational Therapy Treatment Virtual Visit via Video Note  I connected with Alexa White on 03/16/22 at  8:00 AM EST by a video enabled telemedicine application and verified that I am speaking with the correct person using two identifiers.  Location: Patient: home Provider: office   I discussed the limitations of evaluation and management by telemedicine and the availability of in person appointments. The patient expressed understanding and agreed to proceed.    The patient was advised to call back or seek an in-person evaluation if the symptoms worsen or if the condition fails to improve as anticipated.  I provided 55 minutes of non-face-to-face time during this encounter.   Patient Details  Name: Alexa White MRN: [id number] Date of Birth: 06/25/1985 No data recorded  Encounter Date: 03/15/2022   OT End of Session - 03/16/22 0930     Visit Number 13    Number of Visits 20    Date for OT Re-Evaluation 03/30/22    OT Start Time 1200    OT Stop Time 1255    OT Time Calculation (min) 55 min    Activity Tolerance Patient tolerated treatment well    Behavior During Therapy [facility] for tasks assessed/performed             Past Medical History:  Diagnosis Date   Anemia    Anxiety    Depression    PTSD   Former smoker    Quit- 06/01/14   Gestational diabetes    Marijuana use 2016   Positive x4 prenatally    Past Surgical History:  Procedure Laterality Date   nose repair     broken nose- had to be reset   TUBAL LIGATION N/A 07/26/2018   Procedure: POST PARTUM TUBAL LIGATION;  Surgeon: Guss Bunde, MD;  Location: [facility] LD ORS;  Service: Gynecology;  Laterality: N/A;   WISDOM TOOTH EXTRACTION  2011    There were no vitals filed for this visit.   Subjective Assessment - 03/16/22 0930     Currently in Pain?  No/denies    Pain Score 0-No pain                   Group Session:  S: Doing better today  O: During today's OT group session, the patient participated in an educational segment about the importance of goal-setting and the application of the SMART framework to enhance daily life, particularly focusing on ADLs and iADLs. The session began with five open-ended pre-session questions that facilitated group discussion and introspection about their current relationship with goals. Following the introduction and educational segment, participants engaged in brainstorming and group discussions to devise hypothetical SMART goals. The session concluded with five post-session questions to reinforce understanding and facilitate reflection. Throughout the session, there was a range of engagement levels noted among the participants.   A:  Patient was present during the session but displayed passive engagement. They listened to the discussions but contributed minimally when prompted. When asked about personal experiences or thoughts related to the topic, their responses were brief and lacked depth. It was challenging to ascertain their understanding or the potential application of the SMART framework in their life. Further individual assessment or one-on-one discussions might be beneficial to explore any barriers to engagement or understanding of the material presented.   P: Continue to attend PHP OT group sessions 5x  week for 4 weeks to promote daily structure, social engagement, and opportunities to develop and utilize adaptive strategies to maximize functional performance in preparation for safe transition and integration back into school, work, and the community. Plan to address topic of pt 4 goals in next OT group session.                OT Education - 03/16/22 0930     Education Details SMART Goals 3              OT Short Term Goals - 02/28/22 1944       OT SHORT TERM GOAL  #1   Title Client will develop and utilize a personalized coping toolbox containing at least five coping strategies to manage challenging situations, demonstrating their use in real-life scenarios by the end of therapy.    Time 4    Period Weeks    Status On-going    Target Date 03/30/22      OT SHORT TERM GOAL #2   Title By discharge, client will demonstrate the ability to adapt and modify routines when faced with unexpected events or changes, maintaining a balanced approach to daily tasks.      OT SHORT TERM GOAL #3   Title By the time of discharge, client will independently set, track, and make progress towards a long-term goal, demonstrating resilience in overcoming obstacles and seeking support when needed.                      Plan - 03/16/22 0931     Psychosocial Skills Coping Strategies;Habits;Interpersonal Interaction;Routines and Behaviors             Patient will benefit from skilled therapeutic intervention in order to improve the following deficits and impairments:       Psychosocial Skills: Coping Strategies, Habits, Interpersonal Interaction, Routines and Behaviors   Visit Diagnosis: Difficulty coping    Problem List Patient Active Problem List   Diagnosis Date Noted   PTSD (post-traumatic stress disorder) 02/21/2022   Alcohol use disorder 11/22/2021   GAD (generalized anxiety disorder) 09/20/2021   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

## 2022-03-17 ENCOUNTER — Encounter (HOSPITAL_COMMUNITY): Payer: Self-pay

## 2022-03-17 ENCOUNTER — Other Ambulatory Visit (HOSPITAL_COMMUNITY): Payer: BC Managed Care – PPO | Attending: Psychiatry | Admitting: Licensed Clinical Social Worker

## 2022-03-17 ENCOUNTER — Other Ambulatory Visit (HOSPITAL_COMMUNITY): Payer: BC Managed Care – PPO | Attending: Psychiatry

## 2022-03-17 DIAGNOSIS — R4589 Other symptoms and signs involving emotional state: Secondary | ICD-10-CM

## 2022-03-17 DIAGNOSIS — F411 Generalized anxiety disorder: Secondary | ICD-10-CM | POA: Insufficient documentation

## 2022-03-17 DIAGNOSIS — F909 Attention-deficit hyperactivity disorder, unspecified type: Secondary | ICD-10-CM | POA: Insufficient documentation

## 2022-03-17 DIAGNOSIS — F4310 Post-traumatic stress disorder, unspecified: Secondary | ICD-10-CM | POA: Diagnosis not present

## 2022-03-17 DIAGNOSIS — F319 Bipolar disorder, unspecified: Secondary | ICD-10-CM

## 2022-03-17 NOTE — Psych (Signed)
Virtual Visit via Video Note  I connected with Alexa White on 03/16/22 at  9:00 AM EST by a video enabled telemedicine application and verified that I am speaking with the correct person using two identifiers.  Location: Patient: patient home Provider: clinical home office   I discussed the limitations of evaluation and management by telemedicine and the availability of in person appointments. The patient expressed understanding and agreed to proceed.  I discussed the assessment and treatment plan with the patient. The patient was provided an opportunity to ask questions and all were answered. The patient agreed with the plan and demonstrated an understanding of the instructions.   The patient was advised to call back or seek an in-person evaluation if the symptoms worsen or if the condition fails to improve as anticipated.  Pt was provided 240 minutes of non-face-to-face time during this encounter.   Lorin Glass, LCSW   [facility] PHP THERAPIST PROGRESS NOTE  Alexa White [id number]  Session Time: 9:00 - 10:00  Participation Level: Active  Behavioral Response: CasualAlertDepressed  Type of Therapy: Group Therapy  Treatment Goals addressed: Coping  Progress Towards Goals: Progressing  Interventions: CBT, DBT, Supportive, and Reframing  Summary: Alexa White is a 37 y.o. female who presents with depression and trauma symptoms.  Clinician led check-in regarding current stressors and situation, and review of patient completed daily inventory. Clinician utilized active listening and empathetic response and validated patient emotions. Clinician facilitated processing group on pertinent issues.?    Therapist Response: Patient arrived within time allowed. Patient rates her mood at a 5 on a scale of 1-10 with 10 being best. Pt states she feels "a little down." Pt states she slept 7 hours and ate 1x. Pt reports her appt went well and she hopes it is now behind her. Pt reports  lingering anxiety.  Patient able to process. Patient engaged in discussion.           Session Time: 10:00 am - 11:00 am   Participation Level: Active   Behavioral Response: CasualAlertDepressed   Type of Therapy: Group Therapy   Treatment Goals addressed: Coping   Progress Towards Goals: Progressing   Interventions: CBT, DBT, Solution Focused, Strength-based, Supportive, and Reframing   Therapist Response: [name] led discussion on trust and how to establish healthy trust. Group discussed common missteps such as disclosing personal information too soon, ignoring behaviors being displayed, inaccurately defining the relationship, and not giving people credit. [name] utilized CBT thought challenging principles to encourage pt's to rely on "evidence" versus feelings. Group members shared struggles they experience with trust.   Therapist Response: Pt engaged in discussion and reports increased insight.           Session Time: 11:00 -12:00   Participation Level: Active   Behavioral Response: CasualAlertDepressed   Type of Therapy: Group Therapy   Treatment Goals addressed: Coping   Progress Towards Goals: Progressing   Interventions: CBT, DBT, Solution Focused, Strength-based, Supportive, and Reframing   Summary: [name] led discussion on how/when to share sensitive topics with others. [name] discussed the connection between trust and appropriate sharing and how to determine what is appropriate to share.    Therapist Response: Pt engaged in discussion         Session Time: 12:00 -1:00   Participation Level: Active   Behavioral Response: CasualAlertDepressed   Type of Therapy: Group therapy   Treatment Goals addressed: Coping   Progress Towards Goals: Progressing   Interventions: Supportive; Psychoeducation    Summary: 12:00 -  12:50: Occupational Therapy group led by cln E. Hollan.  12:50 - 1:00 Clinician assessed for immediate needs, medication compliance and efficacy,  and safety concerns.   Therapist Response: 12:00 - 12:50: See OT note 12:50 - 1:00 pm: At check-out, patient reports no immediate concerns. Patient demonstrates progress as evidenced by continued engagement in group and responsiveness to treatment. Patient denies SI/HI/self-harm thoughts at the end of group.    Suicidal/Homicidal: Nowithout intent/plan  Plan: Pt will continue in PHP while working to decrease depression and trauma symptoms, increase emotion regulation, and increase ability to manage symptoms in a healthy manner.   Collaboration of Care: Medication Management AEB A Pashayan  Patient/Guardian was advised Release of Information must be obtained prior to any record release in order to collaborate their care with an outside provider. Patient/Guardian was advised if they have not already done so to contact the registration department to sign all necessary forms in order for [place] to release information regarding their care.   Consent: Patient/Guardian gives verbal consent for treatment and assignment of benefits for services provided during this visit. Patient/Guardian expressed understanding and agreed to proceed.   Diagnosis: Bipolar I disorder ([place]) [F31.9]    1. Bipolar I disorder ([place])   2. GAD (generalized anxiety disorder)   3. PTSD (post-traumatic stress disorder)       Lorin Glass, LCSW 03/17/2022

## 2022-03-17 NOTE — Progress Notes (Unsigned)
Spoke with patient via WebEx video call, used 2 identifiers to correctly identify patient. States she started Zoloft and increased her Seroquel but is still picking at her skin. Continues to ask about ADHD. Explained that she would need to be tested at a facility that does the test. Starts IOP next week. Feels anxious today and irritable. Feels like she is quick to cry and become upset. On scale 1-10 as 10 being worst she rates depression at 5 and anxiety at 8. Denies SI/HI or AV hallucinations. No issues or complaints.

## 2022-03-17 NOTE — Psych (Signed)
Virtual Visit via Video Note  I connected with Alexa White on 03/15/22 at  9:00 AM EST by a video enabled telemedicine application and verified that I am speaking with the correct person using two identifiers.  Location: Patient: patient home Provider: clinical home office   I discussed the limitations of evaluation and management by telemedicine and the availability of in person appointments. The patient expressed understanding and agreed to proceed.  I discussed the assessment and treatment plan with the patient. The patient was provided an opportunity to ask questions and all were answered. The patient agreed with the plan and demonstrated an understanding of the instructions.   The patient was advised to call back or seek an in-person evaluation if the symptoms worsen or if the condition fails to improve as anticipated.  Pt was provided 240 minutes of non-face-to-face time during this encounter.   Lorin Glass, LCSW   [facility] PHP THERAPIST PROGRESS NOTE  Alexa White [id number]  Session Time: 9:00 - 10:00  Participation Level: Active  Behavioral Response: CasualAlertDepressed  Type of Therapy: Group Therapy  Treatment Goals addressed: Coping  Progress Towards Goals: Progressing  Interventions: CBT, DBT, Supportive, and Reframing  Summary: Alexa White is a 37 y.o. female who presents with depression and trauma symptoms.  Clinician led check-in regarding current stressors and situation, and review of patient completed daily inventory. Clinician utilized active listening and empathetic response and validated patient emotions. Clinician facilitated processing group on pertinent issues.?    Therapist Response: Patient arrived within time allowed. Patient rates her mood at a 4 on a scale of 1-10 with 10 being best. Pt states she feels "kinda overwhelmed." Pt states she slept 8 hours and ate 1x. Pt reports stressors of a final CPS visit today, an issue with a bill, and  her grandmother's continued deterioration. Pt states struggling with irritable and frustrated feelings. Patient able to process. Patient engaged in discussion.           Session Time: 10:00 am - 11:00 am   Participation Level: Active   Behavioral Response: CasualAlertDepressed   Type of Therapy: Group Therapy   Treatment Goals addressed: Coping   Progress Towards Goals: Progressing   Interventions: CBT, DBT, Solution Focused, Strength-based, Supportive, and Reframing   Therapist Response: [name] led processing group for pt's current struggles. Group members shared stressors and provided support and feedback. [name] brought in topics of boundaries, healthy relationships, and unhealthy thought processes to inform discussion.    Therapist Response:  Pt able to process and provide support to group.          Session Time: 11:00 -12:00   Participation Level: Active   Behavioral Response: CasualAlertDepressed   Type of Therapy: Group Therapy, Spiritual Care   Treatment Goals addressed: Coping   Progress Towards Goals: Progressing   Interventions: Supportive, Education   Summary:  Alexa White, Chaplain, led group.   Therapist Response: Pt participated         Session Time: 12:00 -1:00   Participation Level: Active   Behavioral Response: CasualAlertDepressed   Type of Therapy: Group therapy   Treatment Goals addressed: Coping   Progress Towards Goals: Progressing   Interventions: Supportive; Psychoeducation    Summary: 12:00 - 12:50: Occupational Therapy group led by cln E. Hollan.  12:50 - 1:00 Clinician assessed for immediate needs, medication compliance and efficacy, and safety concerns.   Therapist Response: 12:00 - 12:50: See OT note 12:50 - 1:00 pm: At check-out, patient reports no immediate concerns.  Patient demonstrates progress as evidenced by continued engagement in group and responsiveness to treatment. Patient denies SI/HI/self-harm thoughts at the end  of group.    Suicidal/Homicidal: Nowithout intent/plan  Plan: Pt will continue in PHP while working to decrease depression and trauma symptoms, increase emotion regulation, and increase ability to manage symptoms in a healthy manner.   Collaboration of Care: Medication Management AEB A Pashayan  Patient/Guardian was advised Release of Information must be obtained prior to any record release in order to collaborate their care with an outside provider. Patient/Guardian was advised if they have not already done so to contact the registration department to sign all necessary forms in order for [place] to release information regarding their care.   Consent: Patient/Guardian gives verbal consent for treatment and assignment of benefits for services provided during this visit. Patient/Guardian expressed understanding and agreed to proceed.   Diagnosis: Bipolar I disorder ([place]) [F31.9]    1. Bipolar I disorder ([place])   2. GAD (generalized anxiety disorder)   3. PTSD (post-traumatic stress disorder)       Lorin Glass, LCSW 03/17/2022

## 2022-03-17 NOTE — Psych (Signed)
Virtual Visit via Video Note  I connected with Alexa White on 03/14/22 at  9:00 AM EST by a video enabled telemedicine application and verified that I am speaking with the correct person using two identifiers.  Location: Patient: patient home Provider: clinical home office   I discussed the limitations of evaluation and management by telemedicine and the availability of in person appointments. The patient expressed understanding and agreed to proceed.  I discussed the assessment and treatment plan with the patient. The patient was provided an opportunity to ask questions and all were answered. The patient agreed with the plan and demonstrated an understanding of the instructions.   The patient was advised to call back or seek an in-person evaluation if the symptoms worsen or if the condition fails to improve as anticipated.  Pt was provided 240 minutes of non-face-to-face time during this encounter.   Lorin Glass, LCSW   [facility] PHP THERAPIST PROGRESS NOTE  Alexa White [id number]  Session Time: 9:00 - 10:00  Participation Level: Active  Behavioral Response: CasualAlertDepressed  Type of Therapy: Group Therapy  Treatment Goals addressed: Coping  Progress Towards Goals: Progressing  Interventions: CBT, DBT, Supportive, and Reframing  Summary: Alexa White is a 37 y.o. female who presents with depression and trauma symptoms.  Clinician led check-in regarding current stressors and situation, and review of patient completed daily inventory. Clinician utilized active listening and empathetic response and validated patient emotions. Clinician facilitated processing group on pertinent issues.?    Therapist Response: Patient arrived within time allowed. Patient rates her mood at a 5 on a scale of 1-10 with 10 being best. Pt states she feels "not good." Pt states she slept 6 hours and ate 1x. Pt reports her grandmother is in the hospital and she is worried and concerned re:  her care. Pt struggles with elements outside of her control. Patient able to process. Patient engaged in discussion.           Session Time: 10:00 am - 11:00 am   Participation Level: Active   Behavioral Response: CasualAlertDepressed   Type of Therapy: Group Therapy   Treatment Goals addressed: Coping   Progress Towards Goals: Progressing   Interventions: CBT, DBT, Solution Focused, Strength-based, Supportive, and Reframing   Therapist Response: [name] led discussion on coping with loneliness. Group members shared common reactions to loneliness and how it helps/hinders them. [name] contextualized loneliness and encouraged pt's to manage loneliness as they would any other feeling. Group brainstormed ways to manage.   Therapist Response: Pt engaged in discussion and reports increased insight.           Session Time: 11:00 -12:00   Participation Level: Active   Behavioral Response: CasualAlertDepressed   Type of Therapy: Group Therapy   Treatment Goals addressed: Coping   Progress Towards Goals: Progressing   Interventions: CBT, DBT, Solution Focused, Strength-based, Supportive, and Reframing   Summary: [name] continued topic of DBT distress tolerance skills and the ACCEPTS distraction skill. Group reviewed C-C skills and discussed how they can practice them in their every day life.   Therapist Response: Pt engaged in discussion and is able to state ways to apply this skill.        Session Time: 12:00 -1:00   Participation Level: Active   Behavioral Response: CasualAlertDepressed   Type of Therapy: Group therapy   Treatment Goals addressed: Coping   Progress Towards Goals: Progressing   Interventions: Supportive; Psychoeducation    Summary: 12:00 - 12:50: Occupational Therapy group led  by cln E. Hollan.  12:50 - 1:00 Clinician assessed for immediate needs, medication compliance and efficacy, and safety concerns.   Therapist Response: 12:00 - 12:50: See OT  note 12:50 - 1:00 pm: At check-out, patient reports no immediate concerns. Patient demonstrates progress as evidenced by continued engagement in group and responsiveness to treatment. Patient denies SI/HI/self-harm thoughts at the end of group.    Suicidal/Homicidal: Nowithout intent/plan  Plan: Pt will continue in PHP while working to decrease depression and trauma symptoms, increase emotion regulation, and increase ability to manage symptoms in a healthy manner.   Collaboration of Care: Medication Management AEB A Pashayan  Patient/Guardian was advised Release of Information must be obtained prior to any record release in order to collaborate their care with an outside provider. Patient/Guardian was advised if they have not already done so to contact the registration department to sign all necessary forms in order for [place] to release information regarding their care.   Consent: Patient/Guardian gives verbal consent for treatment and assignment of benefits for services provided during this visit. Patient/Guardian expressed understanding and agreed to proceed.   Diagnosis: Bipolar I disorder ([place]) [F31.9]    1. Bipolar I disorder ([place])   2. GAD (generalized anxiety disorder)       Lorin Glass, LCSW 03/17/2022

## 2022-03-17 NOTE — Therapy (Signed)
[name], [place], [address] Phone: [phone]   Fax:  [phone]  Occupational Therapy Treatment Virtual Visit via Video Note  I connected with Alexa White on 03/17/22 at  8:00 AM EST by a video enabled telemedicine application and verified that I am speaking with the correct person using two identifiers.  Location: Patient: home Provider: office   I discussed the limitations of evaluation and management by telemedicine and the availability of in person appointments. The patient expressed understanding and agreed to proceed.    The patient was advised to call back or seek an in-person evaluation if the symptoms worsen or if the condition fails to improve as anticipated.  I provided 55 minutes of non-face-to-face time during this encounter.   Patient Details  Name: Alexa White MRN: [id number] Date of Birth: Apr 18, 1985 No data recorded  Encounter Date: 03/16/2022   OT End of Session - 03/17/22 1117     Visit Number 14    Number of Visits 20    Date for OT Re-Evaluation 03/30/22    OT Start Time 1200    OT Stop Time 1255    OT Time Calculation (min) 55 min    Activity Tolerance Patient tolerated treatment well    Behavior During Therapy [facility] for tasks assessed/performed             Past Medical History:  Diagnosis Date   Anemia    Anxiety    Depression    PTSD   Former smoker    Quit- 06/01/14   Gestational diabetes    Marijuana use 2016   Positive x4 prenatally    Past Surgical History:  Procedure Laterality Date   nose repair     broken nose- had to be reset   TUBAL LIGATION N/A 07/26/2018   Procedure: POST PARTUM TUBAL LIGATION;  Surgeon: Guss Bunde, MD;  Location: [facility] LD [facility];  Service: Gynecology;  Laterality: N/A;   WISDOM TOOTH EXTRACTION  2011    There were no vitals filed for this visit.   Subjective Assessment - 03/17/22 1117     Currently in Pain?  No/denies    Pain Score 0-No pain                Group Session:  S: Feeling better today.  O: During today's OT group session, the patient participated in an educational segment about the importance of goal-setting and the application of the SMART framework to enhance daily life, particularly focusing on ADLs and iADLs. The session began with five open-ended pre-session questions that facilitated group discussion and introspection about their current relationship with goals. Following the introduction and educational segment, participants engaged in brainstorming and group discussions to devise hypothetical SMART goals. The session concluded with five post-session questions to reinforce understanding and facilitate reflection. Throughout the session, there was a range of engagement levels noted among the participants.   A:  Patient was present during the session but displayed passive engagement. They listened to the discussions but contributed minimally when prompted. When asked about personal experiences or thoughts related to the topic, their responses were brief and lacked depth. It was challenging to ascertain their understanding or the potential application of the SMART framework in their life. Further individual assessment or one-on-one discussions might be beneficial to explore any barriers to engagement or understanding of the material presented.   P: Continue to attend PHP OT group sessions 5x week for 4  weeks to promote daily structure, social engagement, and opportunities to develop and utilize adaptive strategies to maximize functional performance in preparation for safe transition and integration back into school, work, and the community. Plan to address topic of Sleep in next OT group session.                   OT Education - 03/17/22 1117     Education Details SMART Goals 4              OT Short Term Goals - 02/28/22 1944       OT SHORT TERM GOAL  #1   Title Client will develop and utilize a personalized coping toolbox containing at least five coping strategies to manage challenging situations, demonstrating their use in real-life scenarios by the end of therapy.    Time 4    Period Weeks    Status On-going    Target Date 03/30/22      OT SHORT TERM GOAL #2   Title By discharge, client will demonstrate the ability to adapt and modify routines when faced with unexpected events or changes, maintaining a balanced approach to daily tasks.      OT SHORT TERM GOAL #3   Title By the time of discharge, client will independently set, track, and make progress towards a long-term goal, demonstrating resilience in overcoming obstacles and seeking support when needed.                      Plan - 03/17/22 1117     Psychosocial Skills Coping Strategies;Habits;Interpersonal Interaction;Routines and Behaviors             Patient will benefit from skilled therapeutic intervention in order to improve the following deficits and impairments:       Psychosocial Skills: Coping Strategies, Habits, Interpersonal Interaction, Routines and Behaviors   Visit Diagnosis: Difficulty coping    Problem List Patient Active Problem List   Diagnosis Date Noted   PTSD (post-traumatic stress disorder) 02/21/2022   Alcohol use disorder 11/22/2021   GAD (generalized anxiety disorder) 09/20/2021   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

## 2022-03-20 ENCOUNTER — Encounter (HOSPITAL_COMMUNITY): Payer: Self-pay

## 2022-03-20 ENCOUNTER — Other Ambulatory Visit (HOSPITAL_COMMUNITY): Payer: BC Managed Care – PPO

## 2022-03-20 NOTE — Therapy (Signed)
[place], [place], [address] Phone: [phone]   Fax:  [phone]  Occupational Therapy Treatment Virtual Visit via Video Note  I connected with [name] on 03/20/22 at  8:00 AM EST by a video enabled telemedicine application and verified that I am speaking with the correct person using two identifiers.  Location: Patient: home Provider: office   I discussed the limitations of evaluation and management by telemedicine and the availability of in person appointments. The patient expressed understanding and agreed to proceed.    The patient was advised to call back or seek an in-person evaluation if the symptoms worsen or if the condition fails to improve as anticipated.  I provided 55 minutes of non-face-to-face time during this encounter.   Patient Details  Name: Alexa White MRN: [id number] Date of Birth: November 30, 1985 No data recorded  Encounter Date: 03/17/2022   OT End of Session - 03/20/22 1854     Visit Number 15    Number of Visits 20    Date for OT Re-Evaluation 03/30/22    OT Start Time 1200    OT Stop Time 1255    OT Time Calculation (min) 55 min             Past Medical History:  Diagnosis Date   Anemia    Anxiety    Depression    PTSD   Former smoker    Quit- 06/01/14   Gestational diabetes    Marijuana use 2016   Positive x4 prenatally    Past Surgical History:  Procedure Laterality Date   nose repair     broken nose- had to be reset   TUBAL LIGATION N/A 07/26/2018   Procedure: POST PARTUM TUBAL LIGATION;  Surgeon: Guss Bunde, MD;  Location: [facility] LD [facility];  Service: Gynecology;  Laterality: N/A;   WISDOM TOOTH EXTRACTION  2011    There were no vitals filed for this visit.   Subjective Assessment - 03/20/22 1853     Currently in Pain? No/denies    Pain Score 0-No pain                Group Session:  S: doing better today.  O: During the  group therapy session, the occupational therapist discussed the impact of sleep disturbances on daily activities and overall health and wellbeing.   The OT also reviewed various types of sleep disorders, including insomnia, sleep apnea, restless leg syndrome, and narcolepsy, and their associated symptoms. Strategies for managing and treating sleep disturbances were also discussed, such as establishing a consistent sleep routine, avoiding stimulants before bedtime, and engaging in relaxation techniques.   Today's group also included information on how sleep disturbances can cause fatigue, mood changes, cognitive impairment, and physical health problems, and emphasizes the importance of seeking prompt treatment to maintain overall health and wellbeing.   A: In today's session, the patient demonstrated active engagement with the topic of The Importance of Sleep. They eagerly asked questions, contributed personal experiences, and showcased a noticeable eagerness to apply the discussed principles. Their participation indicated not only a strong understanding of the subject matter but also an intrinsic motivation to implement better sleep practices in their daily routine. Based on their proactive involvement, it is assessed that the patient greatly benefited from today's treatment and will likely make efforts to incorporate the insights gained.    P: Continue to attend PHP OT group sessions 5x week for 4 weeks to  promote daily structure, social engagement, and opportunities to develop and utilize adaptive strategies to maximize functional performance in preparation for safe transition and integration back into school, work, and the community. Plan to address topic of Sleep 2 in next OT group session.                   OT Education - 03/20/22 1853     Education Details Sleep 1              OT Short Term Goals - 02/28/22 1944       OT SHORT TERM GOAL #1   Title Client will develop  and utilize a personalized coping toolbox containing at least five coping strategies to manage challenging situations, demonstrating their use in real-life scenarios by the end of therapy.    Time 4    Period Weeks    Status On-going    Target Date 03/30/22      OT SHORT TERM GOAL #2   Title By discharge, client will demonstrate the ability to adapt and modify routines when faced with unexpected events or changes, maintaining a balanced approach to daily tasks.      OT SHORT TERM GOAL #3   Title By the time of discharge, client will independently set, track, and make progress towards a long-term goal, demonstrating resilience in overcoming obstacles and seeking support when needed.                      Plan - 03/20/22 1854     Occupational performance deficits (Please refer to evaluation for details): IADL's;Leisure;Rest and Sleep;Social Participation;Work    Therapist, nutritional;Habits;Interpersonal Interaction;Routines and Behaviors             Patient will benefit from skilled therapeutic intervention in order to improve the following deficits and impairments:       Psychosocial Skills: Coping Strategies, Habits, Interpersonal Interaction, Routines and Behaviors   Visit Diagnosis: Difficulty coping    Problem List Patient Active Problem List   Diagnosis Date Noted   PTSD (post-traumatic stress disorder) 02/21/2022   Alcohol use disorder 11/22/2021   GAD (generalized anxiety disorder) 09/20/2021   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[place]) 09/20/2021   Unintended weight loss 09/20/2021   DUB (dysfunctional uterine bleeding) 09/20/2021   Request for sterilization 07/27/2018   Delivery of viable fetus in abdominal pregnancy 07/26/2018   Normal labor and delivery 07/26/2018   GDM (gestational diabetes mellitus) 05/28/2018   Anxiety and depression 05/23/2018   Supervision of high-risk pregnancy, third trimester 02/20/2018    Brantley Stage,  OT 03/20/2022, 6:54 PM  [facility], [place], [place], [address] Phone: [phone]   Fax:  [phone]  Name: Kitty Cadavid MRN: [id number] Date of Birth: Sep 08, 1985

## 2022-03-20 NOTE — Psych (Signed)
Virtual Visit via Video Note  I connected with Alexa White on 03/17/22 at  9:00 AM EST by a video enabled telemedicine application and verified that I am speaking with the correct person using two identifiers.  Location: Patient: patient home Provider: clinical home office   I discussed the limitations of evaluation and management by telemedicine and the availability of in person appointments. The patient expressed understanding and agreed to proceed.  I discussed the assessment and treatment plan with the patient. The patient was provided an opportunity to ask questions and all were answered. The patient agreed with the plan and demonstrated an understanding of the instructions.   The patient was advised to call back or seek an in-person evaluation if the symptoms worsen or if the condition fails to improve as anticipated.  Pt was provided 240 minutes of non-face-to-face time during this encounter.   Lorin Glass, LCSW   [facility] PHP THERAPIST PROGRESS NOTE  Alexa White [id number]  Session Time: 9:00 - 10:00  Participation Level: Active  Behavioral Response: CasualAlertDepressed  Type of Therapy: Group Therapy  Treatment Goals addressed: Coping  Progress Towards Goals: Progressing  Interventions: CBT, DBT, Supportive, and Reframing  Summary: Alexa White is a 37 y.o. female who presents with depression and trauma symptoms.  Clinician led check-in regarding current stressors and situation, and review of patient completed daily inventory. Clinician utilized active listening and empathetic response and validated patient emotions. Clinician facilitated processing group on pertinent issues.?    Therapist Response: Patient arrived within time allowed. Patient rates her mood at a 5 on a scale of 1-10 with 10 being best. Pt states she feels "okay." Pt states she slept 6 hours and ate 1x. Pt reports she had a "relaxed" afternoon. Pt continues to struggle with appetite.  Patient able to process. Patient engaged in discussion.           Session Time: 10:00 am - 11:00 am   Participation Level: Active   Behavioral Response: CasualAlertDepressed   Type of Therapy: Group Therapy   Treatment Goals addressed: Coping   Progress Towards Goals: Progressing   Interventions: CBT, DBT, Solution Focused, Strength-based, Supportive, and Reframing   Therapist Response: [name] led discussion on ways to manage stressors and feelings over the weekend. Group members  brainstormed things to do over the weekend for multiple levels of energy, access, and moods. [name] reviewed crisis services should they be needed and provided pt's with the text crisis line, mobile crisis, national suicide hotline, [facility] 24/7 line, and information on [facility].     Therapist Response: Pt engaged in discussion and is able to identify 3 ideas of what to do over the weekend to keep their mind engaged.            Session Time: 11:00 -12:00   Participation Level: Active   Behavioral Response: CasualAlertDepressed   Type of Therapy: Group Therapy   Treatment Goals addressed: Coping   Progress Towards Goals: Progressing   Interventions: CBT, DBT, Solution Focused, Strength-based, Supportive, and Reframing   Summary: [name] continued topic of DBT distress tolerance skills and the ACCEPTS distraction skill. Group reviewed E-P skills and discussed how they can practice them in their every day life.   Therapist Response: Pt engaged in discussion and reports understanding of skill and how to apply it.          Session Time: 12:00 -1:00   Participation Level: Active   Behavioral Response: CasualAlertDepressed   Type of Therapy: Group  therapy   Treatment Goals addressed: Coping   Progress Towards Goals: Progressing   Interventions: Supportive; Psychoeducation    Summary: 12:00 - 12:50: Occupational Therapy group led by cln E. Hollan.  12:50 - 1:00 Clinician assessed for  immediate needs, medication compliance and efficacy, and safety concerns.   Therapist Response: 12:00 - 12:50: See OT note 12:50 - 1:00 pm: At check-out, patient reports no immediate concerns. Patient demonstrates progress as evidenced by continued engagement in group and responsiveness to treatment. Patient denies SI/HI/self-harm thoughts at the end of group.    Suicidal/Homicidal: Nowithout intent/plan  Plan: Pt will discharge from PHP due to meeting treatment goals of decreased depression and trauma symptoms, increased emotion regulation, and increased ability to manage symptoms in a healthy manner. Pt will step down to IOP within this agency on 2/6 for further treatment. Pt and provider are aligned with discharge plans. Pt denies SI/HI at discharge.   Collaboration of Care: Medication Management AEB A Pashayan  Patient/Guardian was advised Release of Information must be obtained prior to any record release in order to collaborate their care with an outside provider. Patient/Guardian was advised if they have not already done so to contact the registration department to sign all necessary forms in order for [place] to release information regarding their care.   Consent: Patient/Guardian gives verbal consent for treatment and assignment of benefits for services provided during this visit. Patient/Guardian expressed understanding and agreed to proceed.   Diagnosis: Bipolar I disorder ([place]) [F31.9]    1. Bipolar I disorder ([place])   2. GAD (generalized anxiety disorder)       Lorin Glass, LCSW 03/20/2022

## 2022-03-21 ENCOUNTER — Other Ambulatory Visit (HOSPITAL_COMMUNITY): Payer: BC Managed Care – PPO

## 2022-03-21 ENCOUNTER — Other Ambulatory Visit (HOSPITAL_COMMUNITY): Payer: BC Managed Care – PPO | Admitting: Psychiatry

## 2022-03-21 ENCOUNTER — Encounter (HOSPITAL_COMMUNITY): Payer: Self-pay | Admitting: Psychiatry

## 2022-03-21 ENCOUNTER — Ambulatory Visit (HOSPITAL_COMMUNITY): Payer: BC Managed Care – PPO

## 2022-03-21 DIAGNOSIS — F909 Attention-deficit hyperactivity disorder, unspecified type: Secondary | ICD-10-CM | POA: Diagnosis not present

## 2022-03-21 DIAGNOSIS — F411 Generalized anxiety disorder: Secondary | ICD-10-CM | POA: Insufficient documentation

## 2022-03-21 DIAGNOSIS — F4310 Post-traumatic stress disorder, unspecified: Secondary | ICD-10-CM | POA: Insufficient documentation

## 2022-03-21 DIAGNOSIS — F319 Bipolar disorder, unspecified: Secondary | ICD-10-CM | POA: Insufficient documentation

## 2022-03-21 MED ORDER — SERTRALINE HCL 50 MG PO TABS: 50.0000 mg | ORAL_TABLET | Freq: Every day | ORAL | 1 refills | Status: DC

## 2022-03-21 NOTE — Progress Notes (Signed)
Virtual Visit via Video Note   I connected with Alexa White on 03/21/22 at  9:00 AM EDT by a video enabled telemedicine application and verified that I am speaking with the correct person using two identifiers.   At orientation to the IOP program, Case Manager discussed the limitations of evaluation and management by telemedicine and the availability of in person appointments. The patient expressed understanding and agreed to proceed with virtual visits throughout the duration of the program.   Location:  Patient: Patient Home Provider: OPT [place] Office   History of Present Illness: Bipolar I Disorder, GAD, PTSD  Observations/Objective: Check In: Case Manager checked in with all participants to review discharge dates, insurance authorizations, work-related documents and needs from the treatment team regarding medications. Alexa White stated needs and engaged in discussion.    Initial Therapeutic Activity: Counselor facilitated a check-in with Alexa White to assess for safety, sobriety and medication compliance.  Counselor also inquired about Alexa White's current emotional ratings, as well as any significant changes in thoughts, feelings or behavior since previous check in.  Alexa White presented for session on time and was alert, oriented x5, with no evidence or self-report of active SI/HI or A/V H.  Alexa White reported compliance with medication and denied use of alcohol.  Alexa White reported that she is using THC daily to help with appetite and stress.  Alexa White reported scores of 5/10 for depression, 8/10 for anxiety, and 7/10 for anger/irritability.  Alexa White denied any recent outbursts or panic attacks.  Alexa White reported that a recent success was completing PHP and making the decision to continue with group therapy via [facility] in order to continue learning new coping skills.  Alexa White reported that a current struggle is preparing herself for a return to a normal work schedule.  Alexa White reported that her goal today is to get outside  of the house, and play with her children.                                    Second Therapeutic Activity: Counselor provided demonstration of relaxation technique known as mindful breathing meditation to help members increase sense of calm, resiliency, and control.  Counselor guided members through process of getting comfortable, achieving a relaxed breathing rhythm, and focusing on this for several minutes, allowing troubling thoughts and feelings to come and go without rumination.  Counselor processed effectiveness of activity afterward in discussion with members, including how this impacted their mental state, whether it was difficult to stay focused, and if they plan to include it in self-care routine to improve day-to-day coping.  Intervention effectiveness could not be measured, as Alexa White did not report whether she participated or found it useful.    Third Therapeutic Activity: Counselor introduced [employer], [place] Chaplain to provide psychoeducation on topic of Grief and Loss with members today.  [name] began discussion by checking in with the group about their baseline mood today, general thoughts on what grief means to them and how it has affected them personally in the past.  [name] provided information on how the process of grief/loss can differ depending upon one's unique culture, and categories of loss one could experience (i.e. loss of a person, animal, relationship, job, identity, etc).  [name] encouraged members to be mindful of how pervasive loss can be, and how to recognize signs which could indicate that this is having an impact on one's overall mental health and wellbeing.  Intervention effectiveness could  not be measured, as Alexa White did not participate in discussion.     Assessment and Plan: Counselor recommends that Alexa White remain in IOP treatment to better manage mental health symptoms, ensure stability and pursue completion of treatment plan goals. Counselor recommends adherence  to crisis/safety plan, taking medications as prescribed, and following up with medical professionals if any issues arise.   Follow Up Instructions: Counselor will send Webex link for next session. Alexa White was advised to call back or seek an in-person evaluation if the symptoms worsen or if the condition fails to improve as anticipated.   Collaboration of Care:   Medication Management AEB Dr. [name] or Ricky Ala, NP                                          Case Manager AEB [name], CNA   Patient/Guardian was advised Release of Information must be obtained prior to any record release in order to collaborate their care with an outside provider. Patient/Guardian was advised if they have not already done so to contact the registration department to sign all necessary forms in order for [place] to release information regarding their care.   Consent: Patient/Guardian gives verbal consent for treatment and assignment of benefits for services provided during this visit. Patient/Guardian expressed understanding and agreed to proceed.  I provided 180 minutes of non-face-to-face time during this encounter.   [name], LCSW, LCAS 03/21/22

## 2022-03-21 NOTE — Progress Notes (Signed)
Virtual Visit via Video Note  I connected with Alexa White on @TODAY @ at  9:00 AM EST by a video enabled telemedicine application and verified that I am speaking with the correct person using two identifiers.  Location: Patient: at home Provider: at office   I discussed the limitations of evaluation and management by telemedicine and the availability of in person appointments. The patient expressed understanding and agreed to proceed.  I discussed the assessment and treatment plan with the patient. The patient was provided an opportunity to ask questions and all were answered. The patient agreed with the plan and demonstrated an understanding of the instructions.   The patient was advised to call back or seek an in-person evaluation if the symptoms worsen or if the condition fails to improve as anticipated.  I provided 30 minutes of non-face-to-face time during this encounter.   [name], M.Ed,CNA   Patient ID: Alexa White, female   DOB: 1985/11/10, 37 y.o.   MRN: [id number] As per previous CCA states:  "Alexa White is a 37yo female referred to [facility] by Dr. Adele Schilder for ongoing anxiety, depression, and paranoia. She reports normal ADLs and the following symptoms: tearfulness, feelings of worthlessness, difficulty concentrating, difficulty having conversations, compulsive skin-picking, significantly decreased appetite and weight loss (unsure of how much), paranoia (specifically regarding believing coworkers are constantly talking about her behind her back, and some panic attacks when arriving to work. She reports previous therapy but states she was told that she would need to see a trauma therapist and she currently sees Dr. Adele Schilder for medication management. She denies hospitalizations, suicide attempts, HI and AVH, and medical diagnoses. She reports she has been diagnosed with PTSD, OCD, and GAD. She endorses ongoing passive SI and states one week ago she researched how much medicine she  could take to overdose vs getting really sick. She denies SI at this time. She reports history of NSSI, last occurring 10 years ago. She reports her mother lives in a group home in [place] but pt does not know her mother's diagnoses. She cites her stepmother as her only support and states she lives with her husband and three children. She reports previous alcohol abuse and states she has decreased her alcohol use and last drank a beer two to three weeks ago. She reports she has previously used Alexa White and THC. She reports her husband has a pistol and a rifle, neither of which are secured. When cln explained pt's increased risk for suicide due to access to firearms coupled with increased depression, pt stated "Can you tell my husband that? I've got a 16yo with depression who cuts himself." [name] requested additional details and pt reported her husband keeps one of his guns on the counter in order to be prepared in case of intruders. She states her 62yo son has had two psychiatric hospitalizations and her husband refuses to secure the firearm(s). [name] explained that a CPS report must be filed and explained safety concerns. Pt became increasingly anxious and tearful and repeatedly expressed her fear that her children will be taken away from her. She stated, "If I lose my kids, I'm going to kill myself." [name] expressed that it is highly unlikely CPS will remove her children and will most likely mandate the guns be secured or removed from the home. Pt required reassurance throughout the assessment and was calm once the CCA was completed. CPS report was made to [facility] at 12:38 pm."   Patient transitioned to [facility]-I[facility] from [facility] today.  Reports that PHP was good and is looking forward to learning even more coping skills.  Patient is eating at least one meal a day.  "I have to smoke some THC in order to eat that one meal."  According to pt, her THC use has decreased a lot.  C/O still increased anxiety and picking her face.  States  this is affecting her self-esteem.  On a scale of 1-10 (10 being the worst); pt rates her depression at a 5 and anxiety at a 7.  Denies SI/HI or A/V hallucinations. Scored 18 on PHQ-9. A: Oriented pt today. F/U with Dr. Adele Schilder and will refer pt to a therapist.  Encouraged support groups.  Pt was advised of ROI must be obtained prior to any records release in order to collaborate her care with an outside provider.  Pt was advised if she has not already done so to contact the front desk to sign all necessary forms in order for MH-IOP to release info re: her care.  Consent:  Pt gives verbal consent for tx and assignment of benefits for services provided during this telehealth group process.  Pt expressed understanding and agreed to proceed. Collaboration of care:  Collaborate with Dr. Hildred Laser, Dr. [name] AEB and [name], Cash.   Pt will improve her mood as evidenced by being happy again, managing her mood and coping with daily stressors for 5 out of 7 days for 60 days. R:  Pt receptive.   [name], M.Ed,CNA
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
## 2022-03-22 ENCOUNTER — Other Ambulatory Visit (HOSPITAL_COMMUNITY): Payer: BC Managed Care – PPO | Admitting: Psychiatry

## 2022-03-22 ENCOUNTER — Telehealth (HOSPITAL_COMMUNITY): Payer: BC Managed Care – PPO | Admitting: Psychiatry

## 2022-03-22 ENCOUNTER — Telehealth (HOSPITAL_COMMUNITY): Payer: Self-pay | Admitting: Student in an Organized Health Care Education/Training Program

## 2022-03-22 DIAGNOSIS — F4310 Post-traumatic stress disorder, unspecified: Secondary | ICD-10-CM | POA: Diagnosis not present

## 2022-03-22 DIAGNOSIS — F319 Bipolar disorder, unspecified: Secondary | ICD-10-CM | POA: Diagnosis not present

## 2022-03-22 DIAGNOSIS — F411 Generalized anxiety disorder: Secondary | ICD-10-CM | POA: Diagnosis not present

## 2022-03-22 NOTE — Telephone Encounter (Signed)
Received message that patient had reported issues filling her new prescription of Seroquel.  When I called the patient back she was in line at her pharmacy.  She was able to confirm that they would be able to fill her Seroquel and there were no issues at all.  She was thankful for the call back and had no other concerns at present.   [name] MD Resident

## 2022-03-22 NOTE — Progress Notes (Signed)
Virtual Visit via Video Note   I connected with [name] on 03/22/22 at  9:00 AM EDT by a video enabled telemedicine application and verified that I am speaking with the correct person using two identifiers.   At orientation to the IOP program, Case Manager discussed the limitations of evaluation and management by telemedicine and the availability of in person appointments. The patient expressed understanding and agreed to proceed with virtual visits throughout the duration of the program.   Location:  Patient: Patient Home Provider: [facility] [place] [facility]   History of Present Illness: Bipolar I Disorder, GAD, PTSD  Observations/Objective: Check In: Case Manager checked in with all participants to review discharge dates, insurance authorizations, work-related documents and needs from the treatment team regarding medications. Zarra stated needs and engaged in discussion.    Initial Therapeutic Activity: Counselor facilitated a check-in with V[place]ka to assess for safety, sobriety and medication compliance.  Counselor also inquired about Karlina's current emotional ratings, as well as any significant changes in thoughts, feelings or behavior since previous check in.  Lawanda presented for session on time and was alert, oriented x5, with no evidence or self-report of active SI/HI or A/V H.  Laconya reported compliance with medication and denied use of alcohol or illicit substances.  Zophia reported scores of 3/10 for depression, 6/10 for anxiety, and 7/10 for anger/irritability.  Kharizma denied any recent panic attacks.  Mamye reported that a recent success was avoiding use of THC yesterday, since her medicine was increased and she didn't want them to negatively interact.  Shireen reported that a recent struggle was having an outburst when dropping her kid off at school today, stating "I was trying to aggravate them".  She reported that her goal is to practice relaxation skills when this happens next time in  order to avoid responding aggressively.  Laura-Lee reported that her goal today is to help her son prepare for a job interview Saturday.         Second Therapeutic Activity: Counselor offered to teach group members an ACT relaxation technique today to aid in managing difficult thoughts, feelings, urges, and sensations.  Counselor guided members through process of getting comfortable, achieving relaxing breathing rhythm, and then maintaining this throughout activity.  Counselor invited members to imagine a gently flowing stream in their mind with leaves floating upon it, and when any thoughts, feelings, urges, or sensations arose, good or bad, they were instructed to visualize placing them on these passing leaves over course of practice.  Intervention effectiveness could not be measured, as Jezebelle did not report whether she practiced the exercise, or had any response to it.    Assessment and Plan: Counselor recommends that Lassie remain in IOP treatment to better manage mental health symptoms, ensure stability and pursue completion of treatment plan goals. Counselor recommends adherence to crisis/safety plan, taking medications as prescribed, and following up with medical professionals if any issues arise.   Follow Up Instructions: Counselor will send Webex link for next session. Alisson was advised to call back or seek an in-person evaluation if the symptoms worsen or if the condition fails to improve as anticipated.   Collaboration of Care:   Medication Management AEB Dr. [name] or Ricky Ala, NP                                          Case Manager AEB Velva Harman  Clark, CNA   Patient/Guardian was advised Release of Information must be obtained prior to any record release in order to collaborate their care with an outside provider. Patient/Guardian was advised if they have not already done so to contact the registration department to sign all necessary forms in order for [place] to release information  regarding their care.   Consent: Patient/Guardian gives verbal consent for treatment and assignment of benefits for services provided during this visit. Patient/Guardian expressed understanding and agreed to proceed.  I provided 180 minutes of non-face-to-face time during this encounter.   [name], LCSW, LCAS 03/22/22

## 2022-03-23 ENCOUNTER — Other Ambulatory Visit (HOSPITAL_COMMUNITY): Payer: BC Managed Care – PPO | Attending: Licensed Clinical Social Worker | Admitting: Licensed Clinical Social Worker

## 2022-03-23 DIAGNOSIS — F411 Generalized anxiety disorder: Secondary | ICD-10-CM

## 2022-03-23 DIAGNOSIS — F4310 Post-traumatic stress disorder, unspecified: Secondary | ICD-10-CM

## 2022-03-23 DIAGNOSIS — F319 Bipolar disorder, unspecified: Secondary | ICD-10-CM

## 2022-03-23 NOTE — Progress Notes (Signed)
Virtual Visit via Video Note   I connected with [name] on 03/23/22 at  9:00 AM EDT by a video enabled telemedicine application and verified that I am speaking with the correct person using two identifiers.   At orientation to the IOP program, Case Manager discussed the limitations of evaluation and management by telemedicine and the availability of in person appointments. The patient expressed understanding and agreed to proceed with virtual visits throughout the duration of the program.   Location:  Patient: Patient Home Provider: [facility] [place] [facility]   History of Present Illness: Bipolar I Disorder, GAD, PTSD  Observations/Objective: Check In: Case Manager checked in with all participants to review discharge dates, insurance authorizations, work-related documents and needs from the treatment team regarding medications. Kea stated needs and engaged in discussion.    Initial Therapeutic Activity: Counselor facilitated a check-in with Emilyrose to assess for safety, sobriety and medication compliance.  Counselor also inquired about Dioselina's current emotional ratings, as well as any significant changes in thoughts, feelings or behavior since previous check in.  Phelicia presented for session on time and was alert, oriented x5, with no evidence or self-report of active HI or A/V H.  Janiaya reported compliance with medication and denied use of alcohol or illicit substances.  Prescious reported scores of 9/10 for depression, 7/10 for anxiety, and 7/10 for anger/irritability.  Virlee denied any recent outbursts or panic attacks.  Laurell denied any recent successes.  Sarabelle reported that a recent struggle was experiencing passive SI without intent or plan this morning, which was triggered by arguments with her son and husband yesterday, stating "We were supposed to talk and I started getting frustrated because my husband wasn't helping".  Chayanne reported that she could contract for safety, and agreed to go to the  behavioral hospital if necessary today if SI didn't resolve, and led to development of intent/plan.      Second Therapeutic Activity: Counselor covered topic of distress tolerance skills today.  Counselor utilized a DBT handout which explained how distressing situations don't always have quick solutions, so the only choice is to sit with uncomfortable emotions until they pass.  Counselor offered the IMPROVE acronym as a solution to this problem, which outlined various skills (i.e. Imagery, Meaning, Prayer, Relaxation, 'One thing in the moment', Vacation, and Encouragement) that could be explored in order to improve ability to tolerate discomfort.  Counselor tasked members with identifying personalized strategies for each category which could have been implemented to handle a recent challenge more effectively.  Intervention effectiveness could not be measured, as [name] did not participate.    Assessment and Plan: Counselor recommends that Mackinze remain in IOP treatment to better manage mental health symptoms, ensure stability and pursue completion of treatment plan goals. Counselor recommends adherence to crisis/safety plan, taking medications as prescribed, and following up with medical professionals if any issues arise.   Follow Up Instructions: Counselor will send Webex link for next session. Ermalee was advised to call back or seek an in-person evaluation if the symptoms worsen or if the condition fails to improve as anticipated.   Collaboration of Care:   Medication Management AEB Dr. [name] or Ricky Ala, NP                                          Case Manager AEB [name], CNA   Patient/Guardian was advised Release  of Information must be obtained prior to any record release in order to collaborate their care with an outside provider. Patient/Guardian was advised if they have not already done so to contact the registration department to sign all necessary forms in order for [place] to  release information regarding their care.   Consent: Patient/Guardian gives verbal consent for treatment and assignment of benefits for services provided during this visit. Patient/Guardian expressed understanding and agreed to proceed.  I provided 180 minutes of non-face-to-face time during this encounter.   [name], LCSW, LCAS 03/23/22

## 2022-03-24 ENCOUNTER — Other Ambulatory Visit (HOSPITAL_COMMUNITY): Payer: BC Managed Care – PPO | Admitting: Licensed Clinical Social Worker

## 2022-03-24 ENCOUNTER — Telehealth (HOSPITAL_COMMUNITY): Payer: Self-pay | Admitting: Psychiatry

## 2022-03-27 ENCOUNTER — Ambulatory Visit (HOSPITAL_COMMUNITY): Payer: BC Managed Care – PPO | Admitting: Licensed Clinical Social Worker

## 2022-03-27 ENCOUNTER — Other Ambulatory Visit (HOSPITAL_COMMUNITY): Payer: BC Managed Care – PPO | Admitting: Licensed Clinical Social Worker

## 2022-03-27 DIAGNOSIS — F319 Bipolar disorder, unspecified: Secondary | ICD-10-CM | POA: Diagnosis not present

## 2022-03-27 DIAGNOSIS — F411 Generalized anxiety disorder: Secondary | ICD-10-CM | POA: Diagnosis not present

## 2022-03-27 DIAGNOSIS — F4310 Post-traumatic stress disorder, unspecified: Secondary | ICD-10-CM | POA: Diagnosis not present

## 2022-03-27 NOTE — Progress Notes (Signed)
Virtual Visit via Video Note   I connected with [name] on 03/27/22 at  9:00 AM EDT by a video enabled telemedicine application and verified that I am speaking with the correct person using two identifiers.   At orientation to the IOP program, Case Manager discussed the limitations of evaluation and management by telemedicine and the availability of in person appointments. The patient expressed understanding and agreed to proceed with virtual visits throughout the duration of the program.   Location:  Patient: Patient Home Provider: [facility]    History of Present Illness: Bipolar I Disorder, GAD, PTSD  Observations/Objective: Check In: Case Manager checked in with all participants to review discharge dates, insurance authorizations, work-related documents and needs from the treatment team regarding medications. Cortlyn stated needs and engaged in discussion.    Initial Therapeutic Activity: Counselor facilitated a check-in with Cristine to assess for safety, sobriety and medication compliance.  Counselor also inquired about Douglas's current emotional ratings, as well as any significant changes in thoughts, feelings or behavior since previous check in.  Jc presented for session on time and was alert, oriented x5, with no evidence or self-report of active SI/HI or A/V H.  Marney reported compliance with medication and denied use of alcohol or illicit substances.  Zellie reported scores of 2/10 for depression, 0/10 for anxiety, and 0/10 for anger/irritability.  Kimberl denied any recent outbursts or panic attacks.  Caralynn reported that a recent success was learning that her son got a job to become more independent.  Myla reported that a recent struggle was experiencing frustration over the weekend, stating "Its always simple things.  Sometimes I can handle it and sometimes I can't".  Chi reported that her goal today is to focus on self-care.       Second Therapeutic Activity:  Counselor engaged the group in discussion on managing work/life balance today to improve mental health and wellness.  Counselor explained how finding balance between responsibilities at home and work place can be challenging, and lead to increased stress.  Counselor facilitated discussion on what challenges members are currently, or have historically faced.  Counselor also discussed strategies for improving work/life balance while members work on their mental health during treatment.  Some of these included keeping track of time management; creating a list of priorities and scaling importance; setting realistic, measurable goals each day; establishing boundaries; taking care of health needs; and nurturing relationships at home and work for support.  Counselor inquired about areas where members feel they are excelling, as well as areas they could focus on during treatment. Intervention effectiveness could not be measured, as [name] did not participate.    Assessment and Plan: Counselor recommends that Tatiyanna remain in IOP treatment to better manage mental health symptoms, ensure stability and pursue completion of treatment plan goals. Counselor recommends adherence to crisis/safety plan, taking medications as prescribed, and following up with medical professionals if any issues arise.   Follow Up Instructions: Counselor will send Webex link for next session. Meiling was advised to call back or seek an in-person evaluation if the symptoms worsen or if the condition fails to improve as anticipated.   Collaboration of Care:   Medication Management AEB Dr. [name] or Ricky Ala, NP                                          Case Manager  AEB [name], CNA   Patient/Guardian was advised Release of Information must be obtained prior to any record release in order to collaborate their care with an outside provider. Patient/Guardian was advised if they have not already done so to contact the registration  department to sign all necessary forms in order for [place] to release information regarding their care.   Consent: Patient/Guardian gives verbal consent for treatment and assignment of benefits for services provided during this visit. Patient/Guardian expressed understanding and agreed to proceed.  I provided 180 minutes of non-face-to-face time during this encounter.   [name], LCSW, LCAS 03/27/22

## 2022-03-28 ENCOUNTER — Other Ambulatory Visit (HOSPITAL_COMMUNITY): Payer: BC Managed Care – PPO | Attending: Licensed Clinical Social Worker | Admitting: Licensed Clinical Social Worker

## 2022-03-28 ENCOUNTER — Other Ambulatory Visit (HOSPITAL_COMMUNITY): Payer: BC Managed Care – PPO | Admitting: Licensed Clinical Social Worker

## 2022-03-28 DIAGNOSIS — F429 Obsessive-compulsive disorder, unspecified: Secondary | ICD-10-CM | POA: Insufficient documentation

## 2022-03-28 DIAGNOSIS — Z62810 Personal history of physical and sexual abuse in childhood: Secondary | ICD-10-CM | POA: Insufficient documentation

## 2022-03-28 DIAGNOSIS — F411 Generalized anxiety disorder: Secondary | ICD-10-CM

## 2022-03-28 DIAGNOSIS — Z79899 Other long term (current) drug therapy: Secondary | ICD-10-CM | POA: Diagnosis not present

## 2022-03-28 DIAGNOSIS — F419 Anxiety disorder, unspecified: Secondary | ICD-10-CM | POA: Insufficient documentation

## 2022-03-28 DIAGNOSIS — Z818 Family history of other mental and behavioral disorders: Secondary | ICD-10-CM | POA: Insufficient documentation

## 2022-03-28 DIAGNOSIS — Z91411 Personal history of adult psychological abuse: Secondary | ICD-10-CM | POA: Diagnosis not present

## 2022-03-28 DIAGNOSIS — F319 Bipolar disorder, unspecified: Secondary | ICD-10-CM

## 2022-03-28 DIAGNOSIS — F314 Bipolar disorder, current episode depressed, severe, without psychotic features: Secondary | ICD-10-CM | POA: Diagnosis not present

## 2022-03-28 DIAGNOSIS — Z9152 Personal history of nonsuicidal self-harm: Secondary | ICD-10-CM | POA: Insufficient documentation

## 2022-03-28 DIAGNOSIS — Z91410 Personal history of adult physical and sexual abuse: Secondary | ICD-10-CM | POA: Diagnosis not present

## 2022-03-28 DIAGNOSIS — F1290 Cannabis use, unspecified, uncomplicated: Secondary | ICD-10-CM | POA: Diagnosis not present

## 2022-03-28 DIAGNOSIS — F4310 Post-traumatic stress disorder, unspecified: Secondary | ICD-10-CM

## 2022-03-28 NOTE — Progress Notes (Signed)
Virtual Visit via Video Note   I connected with Alexa White on 03/28/22 at  9:00 AM EDT by a video enabled telemedicine application and verified that I am speaking with the correct person using two identifiers.   At orientation to the IOP program, Case Manager discussed the limitations of evaluation and management by telemedicine and the availability of in person appointments. The patient expressed understanding and agreed to proceed with virtual visits throughout the duration of the program.   Location:  Patient: Patient Home Provider: [facility] [place] [facility]   History of Present Illness: Bipolar I Disorder, GAD, PTSD  Observations/Objective: Check In: Case Manager checked in with all participants to review discharge dates, insurance authorizations, work-related documents and needs from the treatment team regarding medications. Alexa White stated needs and engaged in discussion.    Initial Therapeutic Activity: Counselor facilitated a check-in with Alexa White to assess for safety, sobriety and medication compliance.  Counselor also inquired about Alexa White's current emotional ratings, as well as any significant changes in thoughts, feelings or behavior since previous check in.  Alexa White presented for session on time and was alert, oriented x5, with no evidence or self-report of active SI/HI or A/V H.  Alexa White reported compliance with medication and denied use of alcohol.  Alexa White reported scores of 3/10 for depression, 6/10 for anxiety, and 7/10 for anger/irritability.  Alexa White denied any recent outbursts or panic attacks.  Alexa White reported that a recent success was cleaning up around the home yesterday.  Alexa White denied any new struggles today.  Alexa White reported that her goal today is to go out and clean off her patio with help from her dad.  Alexa White reported that she continues using THC daily and denied any consequences at this time.         Second Therapeutic Activity: Counselor introduced topic of anger management today.   Counselor virtually shared a handout with members on this subject featuring a variety of coping skills, and facilitated discussion on these approaches.  Examples included raising awareness of anger triggers, practicing deep breathing, keeping an anger log to better understand episodes, using diversion activities to distract oneself for 30 minutes, taking a time out when necessary, and being mindful of warning signs tied to thoughts or behavior.  Counselor inquired about which techniques group members have used before, what has proved to be helpful, what their unique warning signs might be, as well as what they will try out in the future to assist with de-escalation.  Intervention effectiveness could not be measured, as Alexa White did not participate.    Assessment and Plan: Counselor recommends that Alexa White remain in IOP treatment to better manage mental health symptoms, ensure stability and pursue completion of treatment plan goals. Counselor recommends adherence to crisis/safety plan, taking medications as prescribed, and following up with medical professionals if any issues arise.   Follow Up Instructions: Counselor will send Webex link for next session. Alexa White was advised to call back or seek an in-person evaluation if the symptoms worsen or if the condition fails to improve as anticipated.   Collaboration of Care:   Medication Management AEB Dr. [name] or Ricky Ala, NP                                          Case Manager AEB [name], CNA   Patient/Guardian was advised Release of Information must be obtained prior to any record  release in order to collaborate their care with an outside provider. Patient/Guardian was advised if they have not already done so to contact the registration department to sign all necessary forms in order for [place] to release information regarding their care.   Consent: Patient/Guardian gives verbal consent for treatment and assignment of benefits for services  provided during this visit. Patient/Guardian expressed understanding and agreed to proceed.  I provided 180 minutes of non-face-to-face time during this encounter.   [name], [place], LCAS 03/28/22

## 2022-03-29 ENCOUNTER — Other Ambulatory Visit (HOSPITAL_COMMUNITY): Payer: BC Managed Care – PPO | Attending: Licensed Clinical Social Worker | Admitting: Licensed Clinical Social Worker

## 2022-03-29 ENCOUNTER — Other Ambulatory Visit (HOSPITAL_COMMUNITY): Payer: BC Managed Care – PPO | Admitting: Licensed Clinical Social Worker

## 2022-03-29 DIAGNOSIS — F411 Generalized anxiety disorder: Secondary | ICD-10-CM | POA: Insufficient documentation

## 2022-03-29 DIAGNOSIS — F4310 Post-traumatic stress disorder, unspecified: Secondary | ICD-10-CM | POA: Diagnosis not present

## 2022-03-29 DIAGNOSIS — F319 Bipolar disorder, unspecified: Secondary | ICD-10-CM | POA: Insufficient documentation

## 2022-03-29 NOTE — Progress Notes (Signed)
Virtual Visit via Video Note   I connected with Alexa White on 03/29/22 at  9:00 AM EDT by a video enabled telemedicine application and verified that I am speaking with the correct person using two identifiers.   At orientation to the IOP program, Case Manager discussed the limitations of evaluation and management by telemedicine and the availability of in person appointments. The patient expressed understanding and agreed to proceed with virtual visits throughout the duration of the program.   Location:  Patient: Patient Home Provider: [facility] [place] [facility]   History of Present Illness: Bipolar I Disorder, GAD, PTSD  Observations/Objective: Check In: Case Manager checked in with all participants to review discharge dates, insurance authorizations, work-related documents and needs from the treatment team regarding medications. Alexa White stated needs and engaged in discussion.    Initial Therapeutic Activity: Counselor facilitated a check-in with Alexa White to assess for safety, sobriety and medication compliance.  Counselor also inquired about Alexa White's current emotional ratings, as well as any significant changes in thoughts, feelings or behavior since previous check in.  Alexa White presented for session on time and was alert, oriented x5, with no evidence or self-report of active SI/HI or A/V H.  Alexa White reported compliance with medication and denied use of alcohol or illicit substances.  Alexa White reported scores of 6/10 for depression, 6/10 for anxiety, and 3/10 for anger/irritability.  Alexa White denied any recent outbursts or panic attacks.  Alexa White reported that a recent struggle was trying to go shopping alone at [facility] yesterday, and feeling very anxious, stating "I thought I was past that, but started thinking people were following me, so I got what I needed, but overspent because I just started grabbing things.  I cried all night after that".  She reported that a success was staying calm when feeling frustrated at  her partner later on, stating "Everything went fine after that".  Alexa White reported that her goal today is to get out of the house and do some more valentines day shopping for her children.        Second Therapeutic Activity: Counselor introduced Alexa White, [employer] Pharmacist, to provide psychoeducation on topic of medication compliance with members today.  Alexa White provided psychoeducation on classes of medications such as antidepressants, antipsychotics, what symptoms they are intended to treat, and any side effects one might encounter while on a particular prescription.  Time was allowed for clients to ask any questions they might have of [facility] regarding this specialty.  Intervention effectiveness could not be measured, as Alexa White did not participate.    Third Therapeutic Activity: Counselor also acknowledged a graduating group member by prompting this member to reflect on progress made since beginning the [place] program, notable takeaways from sessions attended, challenges overcome, and plan for continued care following discharge. Counselor and group members shared observations of growth, words of encouragement and support as this member transitioned out of the program today.  Intervention was effective, as evidenced by Alexa White participating in activity, reporting that she has appreciated the impact this graduating member had on group, and wishes her well in her ongoing therapy.     Assessment and Plan: Counselor recommends that Alexa White remain in IOP treatment to better manage mental health symptoms, ensure stability and pursue completion of treatment plan goals. Counselor recommends adherence to crisis/safety plan, taking medications as prescribed, and following up with medical professionals if any issues arise.   Follow Up Instructions: Counselor will send Webex link for next session. Alexa White was advised to call back or seek an  in-person evaluation if the symptoms worsen or if the condition fails to improve as  anticipated.   Collaboration of Care:   Medication Management AEB Dr. [name] or Ricky Ala, NP                                          Case Manager AEB [name], CNA   Patient/Guardian was advised Release of Information must be obtained prior to any record release in order to collaborate their care with an outside provider. Patient/Guardian was advised if they have not already done so to contact the registration department to sign all necessary forms in order for [place] to release information regarding their care.   Consent: Patient/Guardian gives verbal consent for treatment and assignment of benefits for services provided during this visit. Patient/Guardian expressed understanding and agreed to proceed.  I provided 180 minutes of non-face-to-face time during this encounter.   [name], LCSW, LCAS 03/29/22

## 2022-03-30 ENCOUNTER — Other Ambulatory Visit (HOSPITAL_COMMUNITY): Payer: BC Managed Care – PPO | Admitting: Licensed Clinical Social Worker

## 2022-03-30 ENCOUNTER — Other Ambulatory Visit (HOSPITAL_COMMUNITY): Payer: BC Managed Care – PPO | Attending: Licensed Clinical Social Worker | Admitting: Licensed Clinical Social Worker

## 2022-03-30 DIAGNOSIS — F411 Generalized anxiety disorder: Secondary | ICD-10-CM | POA: Diagnosis not present

## 2022-03-30 DIAGNOSIS — F4310 Post-traumatic stress disorder, unspecified: Secondary | ICD-10-CM | POA: Diagnosis not present

## 2022-03-30 DIAGNOSIS — F319 Bipolar disorder, unspecified: Secondary | ICD-10-CM

## 2022-03-30 DIAGNOSIS — F3162 Bipolar disorder, current episode mixed, moderate: Secondary | ICD-10-CM | POA: Insufficient documentation

## 2022-03-30 NOTE — Progress Notes (Signed)
Virtual Visit via Video Note   I connected with Alexa White on 03/30/22 at  9:00 AM EDT by a video enabled telemedicine application and verified that I am speaking with the correct person using two identifiers.   At orientation to the IOP program, Case Manager discussed the limitations of evaluation and management by telemedicine and the availability of in person appointments. The patient expressed understanding and agreed to proceed with virtual visits throughout the duration of the program.   Location:  Patient: Patient Home Provider: [facility] [place] [facility]   History of Present Illness: Bipolar I Disorder, GAD, PTSD  Observations/Objective: Check In: Case Manager checked in with all participants to review discharge dates, insurance authorizations, work-related documents and needs from the treatment team regarding medications. Alexa White stated needs and engaged in discussion.    Initial Therapeutic Activity: Counselor facilitated a check-in with Alexa White to assess for safety, sobriety and medication compliance.  Counselor also inquired about Alexa White's current emotional ratings, as well as any significant changes in thoughts, feelings or behavior since previous check in.  Alexa White presented for session on time and was alert, oriented x5, with no evidence or self-report of active SI/HI or A/V H.  Alexa White reported compliance with medication and denied use of alcohol or illicit substances.  Alexa White reported scores of 7/10 for depression, 4/10 for anxiety, and 7/10 for irritability.  Alexa White denied any recent panic attacks.  Alexa White reported that a recent success was making dinner for the family for valentines day yesterday.  Alexa White reported that a recent struggle was having 2 outbursts yesterday when she was feeling frustrated about not receiving valentines gifts from her partner.  Alexa White reported that her goal today is to find something to do for self-care.       Second Therapeutic Activity: Counselor utilized a  Radio broadcast assistant with group members today to guide discussion on topic of codependency.  This handout defined codependency as excessive emotional or psychological reliance upon someone who requires support on account of an illness or addiction.  It also explained how this issue presents in dysfunctional family systems, including behavior such as denying existence of problems, rigid boundaries on communication, strained trust, lack of individuality, and reinforcement of unhealthy coping mechanisms such as substance use.  Characteristics of co-dependent people were listed for assistance with identification, such as extreme need for approval/recognition, difficulty identifying feelings, poor communication, and more.  Members were also tasked with completing a questionnaire in order to identify signs of codependency and results were discussed afterward.  This handout also offered strategies for resolving co-dependency within one's network, including increased use of assertive communication skills in order to set appropriate boundaries.  Intervention was effective, as evidenced by Alexa White actively participating in discussion on the subject, and completing codependency questionnaire, with 18 out of 20 positive responses.  Alexa White reported that she was initially surprised by these results, but recognized through discussion on topic that her current relationship with partner is dysfunctional, and communication between them is poor.  Alexa White stated "I try to talk to him about what I'm going through, but he never reciprocates.  Its like talking to a wall".  She reported that her goal will be to continue with therapy in order to ensure that she addresses ongoing relationship problems in an assertive manner, set healthier boundaries within her network, and consider marriage counseling an option if individual approach proves ineffective.  Assessment and Plan: Counselor recommends that Alexa White remain in IOP treatment to better  manage mental health symptoms, ensure  stability and pursue completion of treatment plan goals. Counselor recommends adherence to crisis/safety plan, taking medications as prescribed, and following up with medical professionals if any issues arise.   Follow Up Instructions: Counselor will send Webex link for next session. Alexa White was advised to call back or seek an in-person evaluation if the symptoms worsen or if the condition fails to improve as anticipated.   Collaboration of Care:   Medication Management AEB Dr. [name] or Ricky Ala, NP                                          Case Manager AEB [name], CNA   Patient/Guardian was advised Release of Information must be obtained prior to any record release in order to collaborate their care with an outside provider. Patient/Guardian was advised if they have not already done so to contact the registration department to sign all necessary forms in order for [place] to release information regarding their care.   Consent: Patient/Guardian gives verbal consent for treatment and assignment of benefits for services provided during this visit. Patient/Guardian expressed understanding and agreed to proceed.  I provided 180 minutes of non-face-to-face time during this encounter.   [name], LCSW, LCAS 03/30/22

## 2022-03-31 ENCOUNTER — Other Ambulatory Visit (HOSPITAL_COMMUNITY): Payer: BC Managed Care – PPO | Attending: Licensed Clinical Social Worker | Admitting: Psychiatry

## 2022-03-31 ENCOUNTER — Other Ambulatory Visit (HOSPITAL_COMMUNITY): Payer: BC Managed Care – PPO | Admitting: Licensed Clinical Social Worker

## 2022-03-31 DIAGNOSIS — F4310 Post-traumatic stress disorder, unspecified: Secondary | ICD-10-CM | POA: Insufficient documentation

## 2022-03-31 DIAGNOSIS — F319 Bipolar disorder, unspecified: Secondary | ICD-10-CM | POA: Diagnosis not present

## 2022-03-31 DIAGNOSIS — F411 Generalized anxiety disorder: Secondary | ICD-10-CM

## 2022-03-31 NOTE — Progress Notes (Signed)
Virtual Visit via Video Note   I connected with [name] on 03/31/22 at  9:00 AM EDT by a video enabled telemedicine application and verified that I am speaking with the correct person using two identifiers.   At orientation to the IOP program, Case Manager discussed the limitations of evaluation and management by telemedicine and the availability of in person appointments. The patient expressed understanding and agreed to proceed with virtual visits throughout the duration of the program.   Location:  Patient: Patient Home Provider: [facility] [place] [facility]   History of Present Illness: Bipolar I Disorder, GAD, PTSD  Observations/Objective: Check In: Case Manager checked in with all participants to review discharge dates, insurance authorizations, work-related documents and needs from the treatment team regarding medications. Kaylon stated needs and engaged in discussion.    Initial Therapeutic Activity: Counselor facilitated a check-in with Sesen to assess for safety, sobriety and medication compliance.  Counselor also inquired about Sybilla's current emotional ratings, as well as any significant changes in thoughts, feelings or behavior since previous check in.  Macilynn presented for session on time and was alert, oriented x5, with no evidence or self-report of active SI/HI or A/V H.  Pamie reported compliance with medication and denied use of alcohol or illicit substances.  Janey reported scores of 8/10 for depression, 7/10 for anxiety, and 7/10 for anger/irritability.  Nashara denied any recent panic attacks.  Arnetta reported that a recent success was getting outside the home yesterday to do things with her children since the weather was nice.  Jolicia reported that a recent struggle was experiencing 2 outbursts yesterday when she tried to communicate with her husband about how she was feeling, stating "This group has opened up my eyes a lot to some things.  I see my worth now".  Breona reported that  her goal this weekend is to work with her husband to figure out some plans for self-care.       Second Therapeutic Activity: Counselor introduced topic of building a social support network today.  Counselor explained how this can be defined as having a having a group of healthy people in one's life you can talk to, spend time with, and get help from to improve both mental and physical health.  Counselor noted that some barriers can make it difficult to connect with other people, including the presence of anxiety or depression, or moving to an unfamiliar area.  Group members were asked to assess the current state of their support network, and identify ways that this could be improved.  Tips were given on how to address previously noted barriers, such as strengthening social skills, using relaxation techniques to reduce anxiety, scheduling social time each week, and/or exploring social events nearby which could increase chances of meeting new supports.  Members were also encouraged to consider getting closer to people they already know through suggestions such as outreaching someone by text, email or phone call if they haven't spoken in awhile, doing something nice for a friend/family member unexpectedly, and/or inviting someone over for a game/movie/dinner night.  Intervention effectiveness could not be measured, as [name] did not participate.    Assessment and Plan: Counselor recommends that Trynitee remain in IOP treatment to better manage mental health symptoms, ensure stability and pursue completion of treatment plan goals. Counselor recommends adherence to crisis/safety plan, taking medications as prescribed, and following up with medical professionals if any issues arise.   Follow Up Instructions: Counselor will send Webex link for next session. [name]  was advised to call back or seek an in-person evaluation if the symptoms worsen or if the condition fails to improve as anticipated.   Collaboration of  Care:   Medication Management AEB Dr. [name] or Ricky Ala, NP                                          Case Manager AEB [name], CNA   Patient/Guardian was advised Release of Information must be obtained prior to any record release in order to collaborate their care with an outside provider. Patient/Guardian was advised if they have not already done so to contact the registration department to sign all necessary forms in order for [place] to release information regarding their care.   Consent: Patient/Guardian gives verbal consent for treatment and assignment of benefits for services provided during this visit. Patient/Guardian expressed understanding and agreed to proceed.  I provided 180 minutes of non-face-to-face time during this encounter.   [name], LCSW, LCAS 03/31/22

## 2022-04-03 ENCOUNTER — Encounter (HOSPITAL_COMMUNITY): Payer: Self-pay

## 2022-04-03 ENCOUNTER — Other Ambulatory Visit (HOSPITAL_COMMUNITY): Payer: BC Managed Care – PPO | Attending: Psychiatry | Admitting: Licensed Clinical Social Worker

## 2022-04-03 ENCOUNTER — Other Ambulatory Visit (HOSPITAL_COMMUNITY): Payer: BC Managed Care – PPO | Admitting: Licensed Clinical Social Worker

## 2022-04-03 DIAGNOSIS — F411 Generalized anxiety disorder: Secondary | ICD-10-CM

## 2022-04-03 DIAGNOSIS — F319 Bipolar disorder, unspecified: Secondary | ICD-10-CM

## 2022-04-03 DIAGNOSIS — F424 Excoriation (skin-picking) disorder: Secondary | ICD-10-CM

## 2022-04-03 DIAGNOSIS — F4310 Post-traumatic stress disorder, unspecified: Secondary | ICD-10-CM | POA: Diagnosis not present

## 2022-04-03 MED ORDER — SERTRALINE HCL 100 MG PO TABS: 100.0000 mg | ORAL_TABLET | Freq: Every day | ORAL | 1 refills | Status: DC

## 2022-04-03 NOTE — Progress Notes (Signed)
Virtual Visit via Video Note   I connected with Alexa White on 04/03/22 at  9:00 AM EDT by a video enabled telemedicine application and verified that I am speaking with the correct person using two identifiers.   At orientation to the IOP program, Case Manager discussed the limitations of evaluation and management by telemedicine and the availability of in person appointments. The patient expressed understanding and agreed to proceed with virtual visits throughout the duration of the program.   Location:  Patient: Patient Home Provider: [facility] [place] [facility]   History of Present Illness: Bipolar I Disorder, GAD, PTSD  Observations/Objective: Check In: Case Manager checked in with all participants to review discharge dates, insurance authorizations, work-related documents and needs from the treatment team regarding medications. Alexa White stated needs and engaged in discussion.    Initial Therapeutic Activity: Counselor facilitated a check-in with Alexa White to assess for safety, sobriety and medication compliance.  Counselor also inquired about Alexa White's current emotional ratings, as well as any significant changes in thoughts, feelings or behavior since previous check in.  Alexa White presented for session on time and was alert, oriented x5, with no evidence or self-report of active SI/HI or A/V H.  Alexa White reported compliance with medication and denied use of alcohol or illicit substances.  Alexa White reported scores of 6/10 for depression, 4/10 for anxiety, and 6/10 for anger/irritability.  Alexa White denied any recent panic attacks.  Alexa White reported that a recent success was spending time with her best friend yesterday, who came by to surprise her with a visit, stating "That was nice".  Alexa White reported that a recent struggle was experiencing a few 'small outbursts' over the weekend, stating "Small things can make me mad".  Alexa White reported that her goal today is to have a tough conversation with her son, stating "He was  supposed to start his job the other day, but he didn't complete all the training.  That made me mad and I worry about if he is going to make it there after all".         Second Therapeutic Activity: Counselor introduced topic of self-esteem today and defined this as the value an individual places on oneself, based upon assessment of personal worth as a human being and approval/disapproval of one's behavior. Counselor asked members to assess their level of self-esteem at this time based upon common indicators of high self-esteem, including: accepting oneself unconditionally;  having self-respect and deep seated belief that one matters; being unaffected by other people's opinions/criticisms; and showing good control over emotions.  Counselor also explained concept of one's inner critic which serves to highlight faults and minimize strengths, directly influencing low sense of self-esteem.  Counselor then provided handout on 'strengths and qualities', which featured questions to guide discussion and increase awareness of each member's unique individual abilities which could reinforce higher self-esteem. Examples of questions included: 'things I am good at', 'challenges I have overcome', and 'what I like about myself'.  Intervention was effective, as evidenced by Alexa White actively engaging in discussion on topic, and completing a self-esteem assessment, receiving a score of 9, which indicated a 'diminished' level of self-esteem at this time.  Alexa White was receptive to some strategies offered today for increasing self-esteem during treatment, including prioritizing more time for self-care activities such as eating healthier each day to improve health and image.  Assessment and Plan: Counselor recommends that Alexa White remain in IOP treatment to better manage mental health symptoms, ensure stability and pursue completion of treatment plan goals. Counselor recommends adherence  to crisis/safety plan, taking medications as  prescribed, and following up with medical professionals if any issues arise.   Follow Up Instructions: Counselor will send Webex link for next session. Alexa White was advised to call back or seek an in-person evaluation if the symptoms worsen or if the condition fails to improve as anticipated.   Collaboration of Care:   Medication Management AEB Dr. [name] or Ricky Ala, NP                                          Case Manager AEB [name], CNA   Patient/Guardian was advised Release of Information must be obtained prior to any record release in order to collaborate their care with an outside provider. Patient/Guardian was advised if they have not already done so to contact the registration department to sign all necessary forms in order for [place] to release information regarding their care.   Consent: Patient/Guardian gives verbal consent for treatment and assignment of benefits for services provided during this visit. Patient/Guardian expressed understanding and agreed to proceed.  I provided 180 minutes of non-face-to-face time during this encounter.   [name], LCSW, LCAS 04/03/22

## 2022-04-03 NOTE — Progress Notes (Signed)
BH MD/PA/NP IOP Progress Note  Virtual Visit via Video Note  I connected with Alexa White on 04/03/22 at  9:00 AM EST by a video enabled telemedicine application and verified that I am speaking with the correct person using two identifiers.  Location: Patient: Home Provider: [facility]   I discussed the limitations of evaluation and management by telemedicine and the availability of in person appointments. The patient expressed understanding and agreed to proceed.   04/03/2022 12:29 PM Alexa White  MRN:  [id number]  Chief Complaint:  Chief Complaint  Patient presents with   Follow-up   Anxiety   HPI:  Alexa White is a 37 yr old female who presents via Virtual Video Visit for Follow Up and Medication Management, she enrolled in the IOP Program on 03/21/2022.  PPHx is significant for Bipolar Disorder, GAD, PTSD, and OCD, and a remote History of Self Injurious Behavior (Cutting- last 10+ yrs ago), and no history of Suicide Attempts or Psychiatric Hospitalizations.   She reports she continues to have issues with anxiety and going around others as well as picking at her face.  She reports that when she went to the store the other day there was some paranoia at that others in the store may be after her.  She reports there is some disappointment that she is still having these issues despite medication changes.  Discussed with her that her Zoloft was still at a low dose as we had to ensure mood stability first but that at this point we could further increase the Zoloft to hopefully address these issues.  She was agreeable with this and had no other concerns present.  She reports no SI, HI, or AVH.  She reports her sleep is fair.  She reports her appetite continues to be poor.  She reports no other concerns at present.   Visit Diagnosis:    ICD-10-CM   1. Bipolar I disorder (HCC)  F31.9 sertraline (ZOLOFT) 100 MG tablet    2. GAD (generalized anxiety disorder)  F41.1 sertraline (ZOLOFT) 100  MG tablet    3. PTSD (post-traumatic stress disorder)  F43.10 sertraline (ZOLOFT) 100 MG tablet    4. 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   Past Medical History:  Past Medical History:  Diagnosis Date   Anemia    Anxiety    Depression    PTSD   Former smoker    Quit- 06/01/14   Gestational diabetes    Marijuana use 2016   Positive x4 prenatally    Past Surgical History:  Procedure Laterality Date   nose repair     broken nose- had to be reset   TUBAL LIGATION N/A 07/26/2018   Procedure: POST PARTUM TUBAL LIGATION;  Surgeon: Guss Bunde, MD;  Location: [facility] LD ORS;  Service: Gynecology;  Laterality: N/A;   WISDOM TOOTH EXTRACTION  2011    Family Psychiatric History: Mother- Unknown Illness, possible schizophrenia Son- Depression, 2 past psychiatric hospitalizations Paternal Uncle- Suicide Attempt via asphyxiation by car 2022 No History of Substance Abuse  Family History:  Family History  Problem Relation Age of Onset   Mental illness Mother        bipolar   Heart disease Father     Social History:  Social History  Socioeconomic History   Marital status: Married    Spouse name: Not on file   Number of children: 3   Years of education: Not on file   Highest education level: High school graduate  Occupational History   Not on file  Tobacco Use   Smoking status: Former    Packs/day: 1.00    Years: 3.00    Total pack years: 3.00    Types: Cigarettes    Quit date: 06/01/2014    Years since quitting: 7.8   Smokeless tobacco: Never   Tobacco comments:    VAPES "weed"  Vaping Use   Vaping Use: Never used  Substance and Sexual Activity   Alcohol use: Yes    Comment: socially   Drug use: Yes    Types:  Marijuana   Sexual activity: Yes    Partners: Male    Birth control/protection: Surgical    Comment: tubal  Other Topics Concern   Not on file  Social History Narrative   Not on file   Social Determinants of Health   Financial Resource Strain: Not on file  Food Insecurity: Not on file  Transportation Needs: Not on file  Physical Activity: Not on file  Stress: Not on file  Social Connections: Not on file    Allergies: No Known Allergies  Metabolic Disorder Labs: No results found for: "HGBA1C", "MPG" No results found for: "PROLACTIN" No results found for: "CHOL", "TRIG", "HDL", "CHOLHDL", "VLDL", "LDLCALC" Lab Results  Component Value Date   TSH 1.940 09/20/2021    Therapeutic Level Labs: No results found for: "LITHIUM" No results found for: "VALPROATE" No results found for: "CBMZ"  Current Medications: Current Outpatient Medications  Medication Sig Dispense Refill   QUEtiapine (SEROQUEL) 200 MG tablet Take 1 tablet (200 mg total) by mouth at bedtime. 30 tablet 0   sertraline (ZOLOFT) 100 MG tablet Take 1 tablet (100 mg total) by mouth daily. 30 tablet 1   No current facility-administered medications for this visit.     Musculoskeletal: Strength & Muscle Tone: within normal limits Gait & Station:  Sitting During Interview Patient leans: N/A  Psychiatric Specialty Exam: Review of Systems  Respiratory:  Negative for shortness of breath.   Cardiovascular:  Negative for chest pain.  Gastrointestinal:  Negative for abdominal pain, constipation, diarrhea, nausea and vomiting.  Neurological:  Negative for dizziness, weakness and headaches.  Psychiatric/Behavioral:  Positive for dysphoric mood. Negative for hallucinations, sleep disturbance and suicidal ideas. The patient is nervous/anxious.     There were no vitals taken for this visit.There is no height or weight on file to calculate BMI.  General Appearance: Casual and Fairly Groomed  Eye Contact:  Good  Speech:   Clear and Coherent and Normal Rate  Volume:  Normal  Mood:  Anxious and Dysphoric  Affect:  Congruent  Thought Process:  Coherent and Goal Directed  Orientation:  Full (Time, Place, and Person)  Thought Content: WDL and Logical   Suicidal Thoughts:  No  Homicidal Thoughts:  No  Memory:  Immediate;   Good Recent;   Good  Judgement:  Good  Insight:  Good  Psychomotor Activity:  Normal  Concentration:  Concentration: Good and Attention Span: Good  Recall:  Good  Fund of Knowledge: Good  Language: Good  Akathisia:  Negative  Handed:  Right  AIMS (if indicated): not done  Assets:  Communication Skills Desire for Improvement Housing Physical Health Resilience Social Support  ADL's:  Intact  Cognition: WNL  Sleep:  Good   Screenings: GAD-7    Flowsheet Row Counselor from 02/21/2022 in [place] [facility] Visit from 12/22/2021 in [place] [facility] Visit from 09/20/2021 in [place]  Total GAD-7 Score '15 19 15      '$ PHQ2-9    Flowsheet Row Counselor from 03/21/2022 in [place] Counselor from 03/03/2022 in [place] Counselor from 02/21/2022 in [place] Office Visit from 02/02/2022 in [facility] Visit from 12/22/2021 in [place]  PHQ-2 Total Score '5 4 6 2 4  '$ PHQ-9 Total Score '18 19 19 9 18      '$ Flowsheet Row Counselor from 03/21/2022 in [place] Counselor from 03/03/2022 in [place] Counselor from 02/21/2022 in [place] CATEGORY Error: Question 6 not populated Error: Question 6 not populated Error: Q3, 4, or 5 should not be populated when Q2 is No        Assessment and Plan:   Alexa White is a 37 yr old female who presents via Virtual Video Visit for Follow Up and Medication Management, she enrolled in the IOP Program on 03/21/2022.  PPHx is significant for Bipolar Disorder, GAD, PTSD, and OCD, and a remote History of Self Injurious Behavior (Cutting- last 10+ yrs ago), and no history of Suicide Attempts or Psychiatric Hospitalizations.    Alexa White has continued to have issues with depression, anxiety, and PTSD symptoms.  As mood stability has been established with Seroquel we will increase Zoloft at this time to better address the symptoms.  We will not make any other changes to her medications at time.  We will continue to monitor.   Bipolar Disorder, Depressed Severe  Anxiety  OCD  PTSD: -Continue Seroquel 200 mg QHS for mood stability.  No refills sent at this time. -Increase Zoloft 100 mg daily for depression, anxiety, OCD.  30 tablets with 1 refill.   Collaboration of Care: Collaboration of Care: Other IOP Program  Patient/Guardian was advised Release of Information must be obtained prior to any record release in order to collaborate their care with an outside provider. Patient/Guardian was advised if they have not already done so to contact the registration department to sign all necessary forms in order for [place] to release information regarding their care.   Consent: Patient/Guardian gives verbal consent for treatment and assignment of benefits for services provided during this visit. Patient/Guardian expressed understanding and agreed to proceed.    Briant Cedar, MD 04/03/2022, 12:29 PM   Follow Up Instructions:    I discussed the assessment and treatment plan with the patient. The patient was provided an opportunity to ask questions and all were answered. The patient agreed with the plan and demonstrated an understanding of the instructions.   The patient was advised to call back or seek an in-person evaluation if the symptoms worsen or if the  condition fails to improve as anticipated.  I provided 15 minutes of non-face-to-face time during this encounter.   Briant Cedar, MD

## 2022-04-04 ENCOUNTER — Other Ambulatory Visit (HOSPITAL_COMMUNITY): Payer: BC Managed Care – PPO | Admitting: Licensed Clinical Social Worker

## 2022-04-04 ENCOUNTER — Other Ambulatory Visit (HOSPITAL_COMMUNITY): Payer: BC Managed Care – PPO | Attending: Psychiatry | Admitting: Licensed Clinical Social Worker

## 2022-04-04 DIAGNOSIS — Z818 Family history of other mental and behavioral disorders: Secondary | ICD-10-CM | POA: Insufficient documentation

## 2022-04-04 DIAGNOSIS — F411 Generalized anxiety disorder: Secondary | ICD-10-CM | POA: Diagnosis not present

## 2022-04-04 DIAGNOSIS — F4310 Post-traumatic stress disorder, unspecified: Secondary | ICD-10-CM | POA: Insufficient documentation

## 2022-04-04 DIAGNOSIS — Z62810 Personal history of physical and sexual abuse in childhood: Secondary | ICD-10-CM | POA: Diagnosis not present

## 2022-04-04 DIAGNOSIS — F319 Bipolar disorder, unspecified: Secondary | ICD-10-CM

## 2022-04-04 DIAGNOSIS — Z79899 Other long term (current) drug therapy: Secondary | ICD-10-CM | POA: Diagnosis not present

## 2022-04-04 DIAGNOSIS — Z9152 Personal history of nonsuicidal self-harm: Secondary | ICD-10-CM | POA: Diagnosis not present

## 2022-04-04 DIAGNOSIS — Z91411 Personal history of adult psychological abuse: Secondary | ICD-10-CM | POA: Insufficient documentation

## 2022-04-04 DIAGNOSIS — F314 Bipolar disorder, current episode depressed, severe, without psychotic features: Secondary | ICD-10-CM | POA: Diagnosis not present

## 2022-04-04 DIAGNOSIS — Z91410 Personal history of adult physical and sexual abuse: Secondary | ICD-10-CM | POA: Diagnosis not present

## 2022-04-04 DIAGNOSIS — F1290 Cannabis use, unspecified, uncomplicated: Secondary | ICD-10-CM | POA: Diagnosis not present

## 2022-04-04 NOTE — Progress Notes (Signed)
Virtual Visit via Video Note   I connected with Alexa White on 04/04/22 at  9:00 AM EDT by a video enabled telemedicine application and verified that I am speaking with the correct person using two identifiers.   At orientation to the IOP program, Case Manager discussed the limitations of evaluation and management by telemedicine and the availability of in person appointments. The patient expressed understanding and agreed to proceed with virtual visits throughout the duration of the program.   Location:  Patient: Patient Home Provider: [facility] [place] [facility]   History of Present Illness: Bipolar I Disorder, GAD, PTSD  Observations/Objective: Check In: Case Manager checked in with all participants to review discharge dates, insurance authorizations, work-related documents and needs from the treatment team regarding medications. Alexa White needs and engaged in discussion.    Initial Therapeutic Activity: Counselor facilitated a check-in with Alexa White to assess for safety, sobriety and medication compliance.  Counselor also inquired about Alexa White's current emotional ratings, as well as any significant changes in thoughts, feelings or behavior since previous check in.  Alexa White White for session on time and was alert, oriented x5, with no evidence or self-report of active SI/HI or A/V H.  Alexa White reported compliance with medication and White use of alcohol or illicit substances.  Alexa White reported scores of 4/10 for depression, 6/10 for anxiety, and 6/10 for anger/irritability.  Alexa White any recent outbursts or panic attacks.  Alexa White reported that a recent success was having a good afternoon yesterday, stating "I played with the kids and made dinner".  Alexa White reported that a recent struggle was experiencing nausea this morning she related to her medication increase.  Alexa White reported that her goal today is to do some painting with her children for self-care.       Second Therapeutic Activity: Counselor  introduced [employer], [place] Chaplain to provide psychoeducation on topic of Grief and Loss with members today.  Alexa White began discussion by checking in with the group about their baseline mood today, general thoughts on what grief means to them and how it has affected them personally in the past.  Alexa White provided information on how the process of grief/loss can differ depending upon one's unique culture, and categories of loss one could experience (i.e. loss of a person, animal, relationship, job, identity, etc).  Alexa White encouraged members to be mindful of how pervasive loss can be, and how to recognize signs which could indicate that this is having an impact on one's overall mental health and wellbeing.  Intervention effectiveness could not be measured, as Alexa White did not participate.    Third Therapeutic Activity: Counselor introduced topic of self-care today.  Counselor explained how this can be defined as the things one does to maintain good health and improve well-being.  Counselor provided members with a self-care assessment form to complete.  This handout featured various sub-categories of self-care, including physical, psychological/emotional, social, spiritual, and professional.  Members were asked to rank their engagement in the activities listed for each dimension on a scale of 1-3, with 1 indicating 'Poor', 2 indicating '[place]', and 3 indicating 'Well'.  Counselor invited members to share results of their assessment, and inquired about which areas of self-care they are doing well in, as well as areas that require attention, and how they plan to begin addressing this during treatment.  Intervention was effective, as evidenced by Alexa White successfully completing initial 2 sections of assessment and actively engaging in discussion on subject, reporting that she would benefit from focusing more on  areas such as spending time on hobbies, and doing comforting activities.  Alexa White reported that she would work  to improve self-care deficits by spending more time in nature and listening to music, exploring new hobbies like cooking, or some form of artwork or crafting.    Assessment and Plan: Counselor recommends that [name] remain in IOP treatment to better manage mental health symptoms, ensure stability and pursue completion of treatment plan goals. Counselor recommends adherence to crisis/safety plan, taking medications as prescribed, and following up with medical professionals if any issues arise.   Follow Up Instructions: Counselor will send Webex link for next session. Alexa White was advised to call back or seek an in-person evaluation if the symptoms worsen or if the condition fails to improve as anticipated.   Collaboration of Care:   Medication Management AEB Dr. [name] or Ricky Ala, NP                                          Case Manager AEB [name], CNA   Patient/Guardian was advised Release of Information must be obtained prior to any record release in order to collaborate their care with an outside provider. Patient/Guardian was advised if they have not already done so to contact the registration department to sign all necessary forms in order for [place] to release information regarding their care.   Consent: Patient/Guardian gives verbal consent for treatment and assignment of benefits for services provided during this visit. Patient/Guardian expressed understanding and agreed to proceed.  I provided 180 minutes of non-face-to-face time during this encounter.   [name], LCSW, LCAS 04/04/22

## 2022-04-05 ENCOUNTER — Other Ambulatory Visit (HOSPITAL_COMMUNITY): Payer: BC Managed Care – PPO | Admitting: Licensed Clinical Social Worker

## 2022-04-05 ENCOUNTER — Other Ambulatory Visit (HOSPITAL_COMMUNITY): Payer: BC Managed Care – PPO | Attending: Psychiatry | Admitting: Licensed Clinical Social Worker

## 2022-04-05 DIAGNOSIS — F319 Bipolar disorder, unspecified: Secondary | ICD-10-CM

## 2022-04-05 DIAGNOSIS — F4310 Post-traumatic stress disorder, unspecified: Secondary | ICD-10-CM | POA: Diagnosis not present

## 2022-04-05 DIAGNOSIS — F411 Generalized anxiety disorder: Secondary | ICD-10-CM

## 2022-04-05 NOTE — Progress Notes (Signed)
Virtual Visit via Video Note   I connected with Alexa White on 04/05/22 at  9:00 AM EDT by a video enabled telemedicine application and verified that I am speaking with the correct person using two identifiers.   At orientation to the IOP program, Case Manager discussed the limitations of evaluation and management by telemedicine and the availability of in person appointments. The patient expressed understanding and agreed to proceed with virtual visits throughout the duration of the program.   Location:  Patient: Patient Home Provider: [facility] [place] [facility]   History of Present Illness: Bipolar I Disorder, GAD, PTSD  Observations/Objective: Check In: Case Manager checked in with all participants to review discharge dates, insurance authorizations, work-related documents and needs from the treatment team regarding medications. Alexa White stated needs and engaged in discussion.    Initial Therapeutic Activity: Counselor facilitated a check-in with Alexa White to assess for safety, sobriety and medication compliance.  Counselor also inquired about Alexa White's current emotional ratings, as well as any significant changes in thoughts, feelings or behavior since previous check in.  Alexa White presented for session on time and was alert, oriented x5, with no evidence or self-report of active SI/HI or A/V H.  Alexa White reported compliance with medication and denied use of alcohol or illicit substances.  Alexa White reported scores of 3/10 for depression, 7/10 for anxiety, and 8/10 for irritability.  Alexa White denied any recent outbursts or panic attacks.  Alexa White reported that a recent success was taking time yesterday to play with her kids and talk to the husband, which went better than previous days.  She stated "He's definitely been trying to stay off the phone and games less".  Alexa White reported that a recent struggle was having a difficult talk with her oldest son yesterday, since she worries he will be lazy and get fired from his job.   Alexa White reported that her goal today is to find something positive to do with her family.       Second Therapeutic Activity: Counselor also acknowledged a graduating group member by prompting this member to reflect on progress made since beginning the [facility] program, notable takeaways from sessions attended, challenges overcome, and plan for continued care following discharge. Counselor and group members shared observations of growth, words of encouragement and support as this member transitioned out of the program today.  Intervention was effective, as evidenced by Alexa White engaging in discussion and reporting that she appreciated the positivity that this member brought to group sessions, since it helped lift her spirits on difficult days.     Third Therapeutic Activity: Counselor introduced Alexa White, [employer], to provide psychoeducation on topic of nutrition with members today.  Alexa White virtually shared a comprehensive PowerPoint presentation to guide discussion, which featured the various components of healthy living that influence one's wellbeing, including practicing mindfulness, staying physically active, calm in mood, well-rested, and more.  Alexa White explained how good nutrition reinforces positive physical and mental health, and shared a video which explained how food intake affects brain functioning in particular.  Alexa White provided advice on how to adjust diet in order to promote wellbeing during course of treatment, including achieving balanced daily intake along with regular exercise.  Alexa White offered the 'plate method' as a tool for proper distribution of protein, grains, starches, vegetables, fruit, and low calorie drink, in addition to concept of 'mindful eating', and covering current Dietary Guidelines for Americans for more tips.  Alexa White inquired about changes members would like to make to their nutrition in order to increase overall  wellbeing based upon information shared today, and time was allowed to  ask any questions they might have of Alexa White regarding her specialty.  Intervention effectiveness could not be measured, as Alexa White did not participate.    Assessment and Plan: Counselor recommends that Alexa White remain in IOP treatment to better manage mental health symptoms, ensure stability and pursue completion of treatment plan goals. Counselor recommends adherence to crisis/safety plan, taking medications as prescribed, and following up with medical professionals if any issues arise.   Follow Up Instructions: Counselor will send Webex link for next session. Alexa White was advised to call back or seek an in-person evaluation if the symptoms worsen or if the condition fails to improve as anticipated.   Collaboration of Care:   Medication Management AEB Dr. [name] or Ricky Ala, NP                                          Case Manager AEB [name], CNA   Patient/Guardian was advised Release of Information must be obtained prior to any record release in order to collaborate their care with an outside provider. Patient/Guardian was advised if they have not already done so to contact the registration department to sign all necessary forms in order for [place] to release information regarding their care.   Consent: Patient/Guardian gives verbal consent for treatment and assignment of benefits for services provided during this visit. Patient/Guardian expressed understanding and agreed to proceed.  I provided 180 minutes of non-face-to-face time during this encounter.   [name], LCSW, LCAS 04/05/22

## 2022-04-06 ENCOUNTER — Other Ambulatory Visit (HOSPITAL_COMMUNITY): Payer: BC Managed Care – PPO | Attending: Licensed Clinical Social Worker | Admitting: Licensed Clinical Social Worker

## 2022-04-06 ENCOUNTER — Other Ambulatory Visit (HOSPITAL_COMMUNITY): Payer: BC Managed Care – PPO | Admitting: Licensed Clinical Social Worker

## 2022-04-06 ENCOUNTER — Telehealth (HOSPITAL_COMMUNITY): Payer: Self-pay | Admitting: Psychiatry

## 2022-04-06 DIAGNOSIS — F319 Bipolar disorder, unspecified: Secondary | ICD-10-CM | POA: Insufficient documentation

## 2022-04-06 DIAGNOSIS — F411 Generalized anxiety disorder: Secondary | ICD-10-CM | POA: Insufficient documentation

## 2022-04-06 DIAGNOSIS — F4310 Post-traumatic stress disorder, unspecified: Secondary | ICD-10-CM | POA: Diagnosis not present

## 2022-04-06 NOTE — Progress Notes (Signed)
Virtual Visit via Video Note   I connected with Alexa White on 04/06/22 at  9:00 AM EDT by a video enabled telemedicine application and verified that I am speaking with the correct person using two identifiers.   At orientation to the IOP program, Case Manager discussed the limitations of evaluation and management by telemedicine and the availability of in person appointments. The patient expressed understanding and agreed to proceed with virtual visits throughout the duration of the program.   Location:  Patient: Patient Home Provider: [facility]   History of Present Illness: Bipolar I Disorder, GAD, PTSD  Observations/Objective: Check In: Case Manager checked in with all participants to review discharge dates, insurance authorizations, work-related documents and needs from the treatment team regarding medications. Alexa White stated needs and engaged in discussion.    Initial Therapeutic Activity: Counselor facilitated a check-in with Alexa White to assess for safety, sobriety and medication compliance.  Counselor also inquired about Alexa White's current emotional ratings, as well as any significant changes in thoughts, feelings or behavior since previous check in.  Alexa White presented for session on time and was alert, oriented x5, with no evidence or self-report of active SI/HI or A/V H.  Alexa White reported compliance with medication and denied use of alcohol or illicit substances.  Alexa White reported scores of 4/10 for depression, 7/10 for anxiety, and 8/10 for anger/irritability.  Alexa White denied any recent outbursts or panic attacks.  Alexa White reported that a recent success was working on cleaning her daughter's room yesterday, stating "She even helped a little bit".  Alexa White reported that a recent struggle was almost having an outburst this morning, although she was able to take a timeout to calm down.  Alexa White reported that her goal today is to do some more cleaning around the home.       Second  Therapeutic Activity: Counselor covered topic of core beliefs with group today.  Counselor virtually shared a handout on the subject, which explained how everyone looks at the world differently, and two people can have the same experience, but have different interpretations of what happened.  Members were encouraged to think of these like sunglasses with different "shades" influencing perception towards positive or negative outcomes.  Examples of negative core beliefs were provided, such as "I'm unlovable", "I'm not good enough", and "I'm a bad person".  Members were asked to share which one(s) they could relate to, and then identify evidence which contradicts these beliefs.  Counselor also provided psychoeducation on positive affirmations today.  Counselor explained how these are positive statements which can be spoken out loud or recited mentally to challenge negative thoughts and/or core beliefs to improve mood and outlook each day.  Counselor shared a comprehensive list of affirmations virtually to members with different categories, including ones for health, confidence, success, and happiness.  Counselor invited members to look through this list and identify any which resonated with them, and practice saying them out loud with sincerity.  Intervention was effective, as evidenced by Alexa White successfully participating in discussion on the subject and reporting that she could relate to several negative core beliefs listed on the handout, such as "I am a failure", "Nothing ever goes right", and "The universe hates me".  Alexa White was able to successfully challenge the core belief "Nobody likes me", noting that she has been able to work on her shyness through coming to group, getting to know supportive peers, and receive positive feedback.  Alexa White also reported that she liked several of the positive affirmations listed, such  as "A setback is a setup for a comeback".    Assessment and Plan: Counselor recommends that  Alexa White remain in IOP treatment to better manage mental health symptoms, ensure stability and pursue completion of treatment plan goals. Counselor recommends adherence to crisis/safety plan, taking medications as prescribed, and following up with medical professionals if any issues arise.   Follow Up Instructions: Counselor will send Webex link for next session. Alexa White… was advised to call back or seek an in-person evaluation if the symptoms worsen or if the condition fails to improve as anticipated.   Collaboration of Care:   Medication Management AEB Dr. [name] or Ricky Ala, NP                                          Case Manager AEB [name], CNA   Patient/Guardian was advised Release of Information must be obtained prior to any record release in order to collaborate their care with an outside provider. Patient/Guardian was advised if they have not already done so to contact the registration department to sign all necessary forms in order for [place] to release information regarding their care.   Consent: Patient/Guardian gives verbal consent for treatment and assignment of benefits for services provided during this visit. Patient/Guardian expressed understanding and agreed to proceed.  I provided 180 minutes of non-face-to-face time during this encounter.   [name], LCSW, LCAS 04/06/22

## 2022-04-06 NOTE — Telephone Encounter (Signed)
D:  MH-IOP Case Manager placed call to [facility] [phone]) to inform them that although pt is scheduled to return to work tomorrow (04-07-22); she is still currently enrolled in [facility] through 04-11-22.  Informed rep that pt will d/c on 04-11-22.  Follow up with her psychiatrist (Dr. Adele Schilder) on 04-19-22 @ 4pm.  Was instructed to fax notes/documentation and they would get in contact with pt.  A:  Fax notes.  Inform treatment team and pt.

## 2022-04-07 ENCOUNTER — Other Ambulatory Visit (HOSPITAL_COMMUNITY): Payer: BC Managed Care – PPO | Attending: Licensed Clinical Social Worker | Admitting: Licensed Clinical Social Worker

## 2022-04-07 ENCOUNTER — Other Ambulatory Visit (HOSPITAL_COMMUNITY): Payer: BC Managed Care – PPO | Admitting: Licensed Clinical Social Worker

## 2022-04-07 DIAGNOSIS — F411 Generalized anxiety disorder: Secondary | ICD-10-CM | POA: Diagnosis not present

## 2022-04-07 DIAGNOSIS — F4310 Post-traumatic stress disorder, unspecified: Secondary | ICD-10-CM | POA: Diagnosis not present

## 2022-04-07 DIAGNOSIS — F319 Bipolar disorder, unspecified: Secondary | ICD-10-CM

## 2022-04-07 DIAGNOSIS — F3162 Bipolar disorder, current episode mixed, moderate: Secondary | ICD-10-CM | POA: Diagnosis not present

## 2022-04-07 NOTE — Progress Notes (Signed)
Virtual Visit via Video Note   I connected with [name] on 04/07/22 at  9:00 AM EDT by a video enabled telemedicine application and verified that I am speaking with the correct person using two identifiers.   At orientation to the IOP program, Case Manager discussed the limitations of evaluation and management by telemedicine and the availability of in person appointments. The patient expressed understanding and agreed to proceed with virtual visits throughout the duration of the program.   Location:  Patient: Patient Home Provider: [facility] [place] [facility]   History of Present Illness: Bipolar I Disorder, GAD, PTSD  Observations/Objective: Check In: Case Manager checked in with all participants to review discharge dates, insurance authorizations, work-related documents and needs from the treatment team regarding medications. Earlene stated needs and engaged in discussion.    Initial Therapeutic Activity: Counselor facilitated a check-in with Havin to assess for safety, sobriety and medication compliance.  Counselor also inquired about Shantice's current emotional ratings, as well as any significant changes in thoughts, feelings or behavior since previous check in.  Veora presented for session on time and was alert, oriented x5, with no evidence or self-report of active SI/HI or A/V H.  Avigayil reported compliance with medication and denied use of alcohol or illicit substances.  Success reported scores of 5/10 for depression, 8/10 for anxiety, and 6/10 for anger/irritability.  Earnestene denied any recent outbursts or panic attacks.  Mayari reported that a recent success was getting FMLA approved the other day.  Russie reported that a recent struggle was dealing with stress, stating "Yesterday was not my day.  All my kids are testing boundaries.  Its one thing after another".  Elvena reported that her goal this weekend is to spend quality time with her husband while a family member watches the children.          Second Therapeutic Activity: Counselor introduced topic of assertive communication today.  Counselor shared various handouts with members virtually in group to read along with on the subject.  These handouts defined assertive communication as a communication style in which a person stands up for their own needs and wants, while also taking into consideration the needs and wants of others, without behaving in a passive or aggressive way.  Traits of assertive communicators were highlighted such as using appropriate speaking volume, maintaining eye contact, using confident language, and avoiding interruption.  Members were also provided with tips on how to improve communication, including respecting oneself, expressing thoughts and feelings calmly, and saying "No" when necessary.  Members were given a variety of scenarios where they could practice using these tips to respond in an assertive manner.  Intervention was effective, as evidenced by [name] participating in discussion on topic, reporting that she has a passive/aggressive communication style due to traits such as lack of confidence, trouble maintaining eye contact, becoming easily frustrated, and speaking in a loud or overbearing way when upset.  Tianah reported that this has led to issues such as not speaking up about her feelings or needs until overwhelming stress triggers an outburst, leading to conflict with family.  Burnis showed more effective use of assertive communication skills through engagement in roleplay activities.   Third Therapeutic Activity: Psycho-educational portion of group was provided by Christie Beckers, [employer] of community education with [employer].  Alexandra provided information on history of her local agency, mission statement, and the variety of unique services offered which group members might find beneficial to engage in, including both virtual and in-person support  groups, as well as peer support program for  mentoring.  Alexandra offered time to answer member's questions regarding services and encouraged them to consider utilizing these services to assist in working towards their individual wellness goals.  Intervention effectiveness could not be measured, as [name] did not participate.     Assessment and Plan: Counselor recommends that Aralia remain in IOP treatment to better manage mental health symptoms, ensure stability and pursue completion of treatment plan goals. Counselor recommends adherence to crisis/safety plan, taking medications as prescribed, and following up with medical professionals if any issues arise.   Follow Up Instructions: Counselor will send Webex link for next session. Burkley was advised to call back or seek an in-person evaluation if the symptoms worsen or if the condition fails to improve as anticipated.   Collaboration of Care:   Medication Management AEB Dr. [name] or Ricky Ala, NP                                          Case Manager AEB [name], CNA   Patient/Guardian was advised Release of Information must be obtained prior to any record release in order to collaborate their care with an outside provider. Patient/Guardian was advised if they have not already done so to contact the registration department to sign all necessary forms in order for [place] to release information regarding their care.   Consent: Patient/Guardian gives verbal consent for treatment and assignment of benefits for services provided during this visit. Patient/Guardian expressed understanding and agreed to proceed.  I provided 180 minutes of non-face-to-face time during this encounter.   [name], [place], LCAS 04/07/22

## 2022-04-10 ENCOUNTER — Other Ambulatory Visit (HOSPITAL_COMMUNITY): Payer: BC Managed Care – PPO | Admitting: Licensed Clinical Social Worker

## 2022-04-10 ENCOUNTER — Other Ambulatory Visit (HOSPITAL_COMMUNITY): Payer: BC Managed Care – PPO | Attending: Psychiatry | Admitting: Professional

## 2022-04-10 DIAGNOSIS — F4310 Post-traumatic stress disorder, unspecified: Secondary | ICD-10-CM | POA: Diagnosis not present

## 2022-04-10 DIAGNOSIS — F411 Generalized anxiety disorder: Secondary | ICD-10-CM | POA: Insufficient documentation

## 2022-04-10 DIAGNOSIS — F3162 Bipolar disorder, current episode mixed, moderate: Secondary | ICD-10-CM | POA: Diagnosis not present

## 2022-04-11 ENCOUNTER — Encounter (HOSPITAL_COMMUNITY): Payer: Self-pay | Admitting: Psychiatry

## 2022-04-11 ENCOUNTER — Other Ambulatory Visit (HOSPITAL_COMMUNITY): Payer: BC Managed Care – PPO | Attending: Psychiatry | Admitting: Professional

## 2022-04-11 DIAGNOSIS — Z91410 Personal history of adult physical and sexual abuse: Secondary | ICD-10-CM | POA: Insufficient documentation

## 2022-04-11 DIAGNOSIS — F429 Obsessive-compulsive disorder, unspecified: Secondary | ICD-10-CM | POA: Insufficient documentation

## 2022-04-11 DIAGNOSIS — F411 Generalized anxiety disorder: Secondary | ICD-10-CM | POA: Diagnosis not present

## 2022-04-11 DIAGNOSIS — Z87891 Personal history of nicotine dependence: Secondary | ICD-10-CM | POA: Insufficient documentation

## 2022-04-11 DIAGNOSIS — F1021 Alcohol dependence, in remission: Secondary | ICD-10-CM | POA: Diagnosis not present

## 2022-04-11 DIAGNOSIS — F4310 Post-traumatic stress disorder, unspecified: Secondary | ICD-10-CM | POA: Diagnosis not present

## 2022-04-11 DIAGNOSIS — Z79899 Other long term (current) drug therapy: Secondary | ICD-10-CM | POA: Insufficient documentation

## 2022-04-11 DIAGNOSIS — F424 Excoriation (skin-picking) disorder: Secondary | ICD-10-CM

## 2022-04-11 DIAGNOSIS — F1290 Cannabis use, unspecified, uncomplicated: Secondary | ICD-10-CM | POA: Diagnosis not present

## 2022-04-11 DIAGNOSIS — Z818 Family history of other mental and behavioral disorders: Secondary | ICD-10-CM | POA: Diagnosis not present

## 2022-04-11 DIAGNOSIS — F319 Bipolar disorder, unspecified: Secondary | ICD-10-CM

## 2022-04-11 DIAGNOSIS — F314 Bipolar disorder, current episode depressed, severe, without psychotic features: Secondary | ICD-10-CM | POA: Insufficient documentation

## 2022-04-11 DIAGNOSIS — Z9152 Personal history of nonsuicidal self-harm: Secondary | ICD-10-CM | POA: Diagnosis not present

## 2022-04-11 DIAGNOSIS — F1220 Cannabis dependence, uncomplicated: Secondary | ICD-10-CM

## 2022-04-11 DIAGNOSIS — Z91411 Personal history of adult psychological abuse: Secondary | ICD-10-CM | POA: Insufficient documentation

## 2022-04-11 DIAGNOSIS — Z62810 Personal history of physical and sexual abuse in childhood: Secondary | ICD-10-CM | POA: Insufficient documentation

## 2022-04-11 MED ORDER — SERTRALINE HCL 50 MG PO TABS: 150.0000 mg | ORAL_TABLET | Freq: Every day | ORAL | 1 refills | Status: DC

## 2022-04-11 MED ORDER — QUETIAPINE FUMARATE 200 MG PO TABS: 200.0000 mg | ORAL_TABLET | Freq: Every day | ORAL | 0 refills | Status: DC

## 2022-04-11 NOTE — Patient Instructions (Signed)
D:  Patient completed MH-IOP today.  A:  Discharge today.  Follow up with Dr. Adele Schilder on 05-04-22@ 8:20 a.m. (virtual).  Patient was provided with a list of therapists since Dr.  [facility] office doesn't have any openings at this time.  Strongly encouraged support groups through [facility] [phone].  Return to work on 04-12-22, without any restrictions.  R:  Patient receptive.
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
## 2022-04-12 NOTE — Progress Notes (Signed)
Virtual Visit via Video Note   I connected with Alexa White on 04/11/22 at  9:00 AM EDT by a video enabled telemedicine application and verified that I am speaking with the correct person using two identifiers.   At orientation to the IOP program, Case Manager discussed the limitations of evaluation and management by telemedicine and the availability of in person appointments. The patient expressed understanding and agreed to proceed with virtual visits throughout the duration of the program.   Location:  Patient: Patient Home Provider: Clinical Home Office   History of Present Illness: Bipolar I Disorder, GAD, PTSD  Observations/Objective: Check In: Case Manager checked in with all participants to review discharge dates, insurance authorizations, work-related documents and needs from the treatment team regarding medications. Alexa White stated needs and engaged in discussion.    Initial Therapeutic Activity: Counselor facilitated a check-in with Alexa White to assess for safety, sobriety and medication compliance.  Counselor also inquired about Alexa White's current emotional ratings, as well as any significant changes in thoughts, feelings or behavior since previous check in.  Alexa White presented for session on time and was alert, oriented x5, with no evidence or self-report of active SI/HI or A/V H.  Alexa White reported compliance with medication and denied use of alcohol or illicit substances.  Alexa White reported scores of 8/10 for depression, 10/10 for anxiety, and 5/10 overall reporting "I'm OK."  Alexa White endorsed panic attack last night due to thinking about returning to work.  Alexa White reported that a recent success was being able to manage her panic attack. Alexa White reported that a recent struggle was experiencing anxiety related to returning to work and leaving IOP. Alexa White is able to process in group. Alexa White reported that her goal today is to remain calm and enjoy her last evening at home before returning to work.        Second Therapeutic Activity: Counselor introduced Alexa White, Alexa White to provide psychoeducation on topic of Grief and Loss with members today.  Alexa White began discussion by checking in with the group about their baseline mood today, general thoughts on what grief means to them and how it has affected them personally in the past.  Alexa White provided information on how the process of grief/loss can differ depending upon one's unique culture, and categories of loss one could experience (i.e. loss of a person, animal, relationship, job, identity, etc).  Alexa White encouraged members to be mindful of how pervasive loss can be, and how to recognize signs which could indicate that this is having an impact on one's overall mental health and wellbeing.  Intervention effectiveness could not be measured, as Alexa White did not participate.    Third Therapeutic Activity: Counselor introduced [employer], Alexa White.  Group viewed Alexa White entitled "How To Not Take Things Personally" presented by Alexa White. [name] utilized CBT principles to inform discussion while pts were encouraged to share their response to the video. Intervention was not effective as Alexa White was not present for the majority of the video and discussion and requested the link be emailed to her. She denied safety concerns at the end of group.   Assessment and Plan: Alexa White will discharge from IOP due to meeting treatment goals of decreasing depression and anxiety symptoms, decreasing SI, and increasing coping abilities. Progress was measured by observation, self-report, and scales. Provider has approved discharge and patient reports alignment with discharge plan. Patient will step down to individual counseling and psychiatry. Patient provided with referrals for counselors. Pt is scheduled with Dr. Adele Schilder for psychiatry  on 05/04/22 @ 8:20a. Patient denies any SI/HI at time of discharge.   Follow Up Instructions: Alexa White was advised to call back or  seek an in-person evaluation if the symptoms worsen or if the condition fails to improve as anticipated. Alexa White is encouraged to call counseling referrals.   Collaboration of Care:   Medication Management AEB Dr. [name] or Ricky Ala, NP                                          Case Manager AEB [name], CNA   Patient/Guardian was advised Release of Information must be obtained prior to any record release in order to collaborate their care with an outside provider. Patient/Guardian was advised if they have not already done so to contact the registration department to sign all necessary forms in order for [place] to release information regarding their care.   Consent: Patient/Guardian gives verbal consent for treatment and assignment of benefits for services provided during this visit. Patient/Guardian expressed understanding and agreed to proceed.  I provided 180 minutes of non-face-to-face time during this encounter.   Loistine Chance, [facility] 04/11/2022

## 2022-04-12 NOTE — Progress Notes (Signed)
Virtual Visit via Video Note   I connected with [name] on 04/10/22 at  9:00 AM EDT by a video enabled telemedicine application and verified that I am speaking with the correct person using two identifiers.   At orientation to the IOP program, Case Manager discussed the limitations of evaluation and management by telemedicine and the availability of in person appointments. The patient expressed understanding and agreed to proceed with virtual visits throughout the duration of the program.   Location:  Patient: Patient Home Provider: Clinical Home Office   History of Present Illness: Bipolar I Disorder, GAD, PTSD  Observations/Objective: Check In: Case Manager checked in with all participants to review discharge dates, insurance authorizations, work-related documents and needs from the treatment team regarding medications. Jalinda stated needs and engaged in discussion.    Initial Therapeutic Activity: Counselor facilitated a check-in with Zairah to assess for safety, sobriety and medication compliance.  Counselor also inquired about Preslei's current emotional ratings, as well as any significant changes in thoughts, feelings or behavior since previous check in.  Rickell presented for session on time and was alert, oriented x5, with no evidence or self-report of active SI/HI or A/V H.  Karianna reported compliance with medication and denied use of alcohol or illicit substances.  Sura reported scores of 3/10 for depression, 7/10 for anxiety, and 5/10 for overall.  Michelene denied any recent outbursts or panic attacks.  Raelynn reported that a recent success was having quality time with her husband.  Angelin reported that a recent struggle was continuing to have a decreased appetite.  Brittiney reported that her goal today is to relax.         Second Therapeutic Activity: Clinician introduced topic of "Positive Psychology". Group watched "Positive Psychology" Ted-Talk. Patients discussed how their "lens"  of life affects the way they feel. Group discussed 5 strategies to help change lens. Patients identified one strategy they would be willing to try to change their "lens" for at least 21 days to create a new habit. Laci reported she will try writing 3 gratitudes each day to change her lens.  Assessment and Plan: Counselor recommends that Jo-Anne remain in IOP treatment to better manage mental health symptoms, ensure stability and pursue completion of treatment plan goals. Counselor recommends adherence to crisis/safety plan, taking medications as prescribed, and following up with medical professionals if any issues arise.   Follow Up Instructions: Counselor will send Webex link for next session. Cerise was advised to call back or seek an in-person evaluation if the symptoms worsen or if the condition fails to improve as anticipated.   Collaboration of Care:   Medication Management AEB Dr. [name] or Ricky Ala, NP                                          Case Manager AEB [name], CNA   Patient/Guardian was advised Release of Information must be obtained prior to any record release in order to collaborate their care with an outside provider. Patient/Guardian was advised if they have not already done so to contact the registration department to sign all necessary forms in order for [place] to release information regarding their care.   Consent: Patient/Guardian gives verbal consent for treatment and assignment of benefits for services provided during this visit. Patient/Guardian expressed understanding and agreed to proceed.  I provided 180 minutes of non-face-to-face time during  this encounter.   Loistine Chance, [facility] 04/10/22

## 2022-04-19 ENCOUNTER — Telehealth (HOSPITAL_COMMUNITY): Payer: BC Managed Care – PPO | Admitting: Psychiatry

## 2022-04-27 ENCOUNTER — Telehealth (HOSPITAL_COMMUNITY): Payer: Self-pay | Admitting: Psychiatry

## 2022-04-27 NOTE — Telephone Encounter (Signed)
D:  Pt sent MH-IOP case manager an email wanting to speak to Dr. [name] about her medications.  States she's been having a difficult time (ie. Poor appetite, increased anxiety, paranoia).  Reports it is difficult for her to work.  Case manager attempted to call pt, but there was no answer.  Sent pt email encouraging her to go to [facility] for an assessment.  Also, mentioned her appt is 05-04-22 not 05-08-22 with Dr. Adele Schilder.  Informed pt she could call the front desk to see if Dr. Adele Schilder has any cancellations before 05-04-22 and or she could contact his nursing staff.  A:  Inform Dr. Adele Schilder and his nursing staff.

## 2022-05-04 ENCOUNTER — Telehealth (HOSPITAL_BASED_OUTPATIENT_CLINIC_OR_DEPARTMENT_OTHER): Payer: BC Managed Care – PPO | Admitting: Psychiatry

## 2022-05-04 ENCOUNTER — Encounter (HOSPITAL_COMMUNITY): Payer: Self-pay | Admitting: Psychiatry

## 2022-05-04 VITALS — Wt 100.0 lb

## 2022-05-04 DIAGNOSIS — F1220 Cannabis dependence, uncomplicated: Secondary | ICD-10-CM

## 2022-05-04 DIAGNOSIS — F4310 Post-traumatic stress disorder, unspecified: Secondary | ICD-10-CM | POA: Diagnosis not present

## 2022-05-04 DIAGNOSIS — F411 Generalized anxiety disorder: Secondary | ICD-10-CM

## 2022-05-04 DIAGNOSIS — F319 Bipolar disorder, unspecified: Secondary | ICD-10-CM

## 2022-05-04 MED ORDER — QUETIAPINE FUMARATE 300 MG PO TABS: 300.0000 mg | ORAL_TABLET | Freq: Every day | ORAL | 0 refills | Status: DC

## 2022-05-04 MED ORDER — SERTRALINE HCL 50 MG PO TABS: 150.0000 mg | ORAL_TABLET | Freq: Every day | ORAL | 0 refills | Status: DC

## 2022-05-04 MED ORDER — LAMOTRIGINE 25 MG PO TABS: ORAL_TABLET | ORAL | 0 refills | Status: DC
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
## 2022-05-18 ENCOUNTER — Telehealth (HOSPITAL_BASED_OUTPATIENT_CLINIC_OR_DEPARTMENT_OTHER): Payer: BC Managed Care – PPO | Admitting: Psychiatry

## 2022-05-18 ENCOUNTER — Encounter (HOSPITAL_COMMUNITY): Payer: Self-pay | Admitting: Psychiatry

## 2022-05-18 VITALS — Wt 100.0 lb

## 2022-05-18 DIAGNOSIS — F411 Generalized anxiety disorder: Secondary | ICD-10-CM | POA: Diagnosis not present

## 2022-05-18 DIAGNOSIS — F319 Bipolar disorder, unspecified: Secondary | ICD-10-CM | POA: Diagnosis not present

## 2022-05-18 DIAGNOSIS — F4310 Post-traumatic stress disorder, unspecified: Secondary | ICD-10-CM

## 2022-05-18 MED ORDER — LAMOTRIGINE 100 MG PO TABS
100.0000 mg | ORAL_TABLET | Freq: Every day | ORAL | 0 refills | Status: DC
Start: 2022-05-18 — End: 2022-06-15

## 2022-05-18 MED ORDER — QUETIAPINE FUMARATE 400 MG PO TABS
400.0000 mg | ORAL_TABLET | Freq: Every day | ORAL | 0 refills | Status: DC
Start: 2022-05-18 — End: 2022-06-15

## 2022-05-18 MED ORDER — SERTRALINE HCL 50 MG PO TABS: 150.0000 mg | ORAL_TABLET | Freq: Every day | ORAL | 0 refills | Status: DC
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
## 2022-05-27 ENCOUNTER — Other Ambulatory Visit (HOSPITAL_COMMUNITY): Payer: Self-pay | Admitting: Psychiatry

## 2022-05-27 DIAGNOSIS — F319 Bipolar disorder, unspecified: Secondary | ICD-10-CM

## 2022-05-27 DIAGNOSIS — F1220 Cannabis dependence, uncomplicated: Secondary | ICD-10-CM

## 2022-05-27 DIAGNOSIS — F4310 Post-traumatic stress disorder, unspecified: Secondary | ICD-10-CM

## 2022-06-01 ENCOUNTER — Other Ambulatory Visit (HOSPITAL_COMMUNITY): Payer: Self-pay | Admitting: Psychiatry

## 2022-06-01 DIAGNOSIS — F319 Bipolar disorder, unspecified: Secondary | ICD-10-CM

## 2022-06-15 ENCOUNTER — Encounter (HOSPITAL_COMMUNITY): Payer: Self-pay | Admitting: Psychiatry

## 2022-06-15 ENCOUNTER — Telehealth (HOSPITAL_BASED_OUTPATIENT_CLINIC_OR_DEPARTMENT_OTHER): Payer: BC Managed Care – PPO | Admitting: Psychiatry

## 2022-06-15 DIAGNOSIS — F411 Generalized anxiety disorder: Secondary | ICD-10-CM

## 2022-06-15 DIAGNOSIS — F4310 Post-traumatic stress disorder, unspecified: Secondary | ICD-10-CM

## 2022-06-15 DIAGNOSIS — F319 Bipolar disorder, unspecified: Secondary | ICD-10-CM | POA: Diagnosis not present

## 2022-06-15 MED ORDER — QUETIAPINE FUMARATE 400 MG PO TABS
400.0000 mg | ORAL_TABLET | Freq: Every day | ORAL | 1 refills | Status: DC
Start: 2022-06-15 — End: 2022-08-15

## 2022-06-15 MED ORDER — LAMOTRIGINE 150 MG PO TABS
150.0000 mg | ORAL_TABLET | Freq: Every day | ORAL | 1 refills | Status: DC
Start: 2022-06-15 — End: 2022-08-15

## 2022-06-15 MED ORDER — SERTRALINE HCL 50 MG PO TABS: 150.0000 mg | ORAL_TABLET | Freq: Every day | ORAL | 1 refills | Status: DC
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
## 2022-07-18 ENCOUNTER — Other Ambulatory Visit (HOSPITAL_COMMUNITY): Payer: Self-pay | Admitting: Psychiatry

## 2022-07-18 DIAGNOSIS — F4310 Post-traumatic stress disorder, unspecified: Secondary | ICD-10-CM

## 2022-07-18 DIAGNOSIS — F319 Bipolar disorder, unspecified: Secondary | ICD-10-CM

## 2022-07-20 ENCOUNTER — Other Ambulatory Visit (HOSPITAL_COMMUNITY): Payer: Self-pay | Admitting: Psychiatry

## 2022-07-20 DIAGNOSIS — F319 Bipolar disorder, unspecified: Secondary | ICD-10-CM

## 2022-08-11 ENCOUNTER — Other Ambulatory Visit (HOSPITAL_COMMUNITY): Payer: Self-pay | Admitting: Psychiatry

## 2022-08-11 DIAGNOSIS — F4310 Post-traumatic stress disorder, unspecified: Secondary | ICD-10-CM

## 2022-08-11 DIAGNOSIS — F319 Bipolar disorder, unspecified: Secondary | ICD-10-CM

## 2022-08-15 ENCOUNTER — Telehealth (HOSPITAL_BASED_OUTPATIENT_CLINIC_OR_DEPARTMENT_OTHER): Payer: BC Managed Care – PPO | Admitting: Psychiatry

## 2022-08-15 ENCOUNTER — Encounter (HOSPITAL_COMMUNITY): Payer: Self-pay | Admitting: Psychiatry

## 2022-08-15 VITALS — Wt 100.0 lb

## 2022-08-15 DIAGNOSIS — F319 Bipolar disorder, unspecified: Secondary | ICD-10-CM

## 2022-08-15 DIAGNOSIS — F4310 Post-traumatic stress disorder, unspecified: Secondary | ICD-10-CM | POA: Diagnosis not present

## 2022-08-15 DIAGNOSIS — F411 Generalized anxiety disorder: Secondary | ICD-10-CM | POA: Diagnosis not present

## 2022-08-15 MED ORDER — LAMOTRIGINE 200 MG PO TABS: 200.0000 mg | ORAL_TABLET | Freq: Every day | ORAL | 1 refills | Status: DC

## 2022-08-15 MED ORDER — QUETIAPINE FUMARATE 400 MG PO TABS: 400.0000 mg | ORAL_TABLET | Freq: Every day | ORAL | 1 refills | Status: DC

## 2022-08-15 MED ORDER — SERTRALINE HCL 50 MG PO TABS: 150.0000 mg | ORAL_TABLET | Freq: Every day | ORAL | 1 refills | Status: DC
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
## 2022-08-22 ENCOUNTER — Other Ambulatory Visit (HOSPITAL_COMMUNITY): Payer: Self-pay | Admitting: Psychiatry

## 2022-08-22 DIAGNOSIS — F319 Bipolar disorder, unspecified: Secondary | ICD-10-CM

## 2022-09-15 ENCOUNTER — Other Ambulatory Visit (HOSPITAL_COMMUNITY): Payer: Self-pay | Admitting: Psychiatry

## 2022-09-15 DIAGNOSIS — F319 Bipolar disorder, unspecified: Secondary | ICD-10-CM

## 2022-10-14 ENCOUNTER — Other Ambulatory Visit (HOSPITAL_COMMUNITY): Payer: Self-pay | Admitting: Psychiatry

## 2022-10-14 DIAGNOSIS — F319 Bipolar disorder, unspecified: Secondary | ICD-10-CM

## 2022-10-14 DIAGNOSIS — F4310 Post-traumatic stress disorder, unspecified: Secondary | ICD-10-CM

## 2022-10-17 ENCOUNTER — Encounter (HOSPITAL_COMMUNITY): Payer: Self-pay | Admitting: Psychiatry

## 2022-10-17 ENCOUNTER — Telehealth (HOSPITAL_BASED_OUTPATIENT_CLINIC_OR_DEPARTMENT_OTHER): Payer: BC Managed Care – PPO | Admitting: Psychiatry

## 2022-10-17 VITALS — Wt 100.0 lb

## 2022-10-17 DIAGNOSIS — F411 Generalized anxiety disorder: Secondary | ICD-10-CM

## 2022-10-17 DIAGNOSIS — F319 Bipolar disorder, unspecified: Secondary | ICD-10-CM

## 2022-10-17 DIAGNOSIS — F4310 Post-traumatic stress disorder, unspecified: Secondary | ICD-10-CM

## 2022-10-17 MED ORDER — QUETIAPINE FUMARATE 400 MG PO TABS: 400.0000 mg | ORAL_TABLET | Freq: Every day | ORAL | 1 refills | Status: DC

## 2022-10-17 MED ORDER — LAMOTRIGINE 200 MG PO TABS
200.0000 mg | ORAL_TABLET | Freq: Every day | ORAL | 1 refills | Status: DC
Start: 2022-10-17 — End: 2022-12-25

## 2022-10-17 MED ORDER — SERTRALINE HCL 50 MG PO TABS: 150.0000 mg | ORAL_TABLET | Freq: Every day | ORAL | 1 refills | Status: DC

## 2022-10-17 MED ORDER — GABAPENTIN 100 MG PO CAPS
100.0000 mg | ORAL_CAPSULE | Freq: Two times a day (BID) | ORAL | 1 refills | Status: DC
Start: 2022-10-17 — End: 2022-12-25
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
## 2022-11-28 ENCOUNTER — Telehealth (HOSPITAL_COMMUNITY): Payer: Self-pay | Admitting: *Deleted

## 2022-11-28 NOTE — Telephone Encounter (Signed)
Yes okay to complete

## 2022-11-28 NOTE — Telephone Encounter (Signed)
We received FMLA paperwork today from [facility] [employer]. Writer spoke with pt who verified this paperwork is for intermittent FMLA not a LOA. She is requesting the start date for paperwork be submitted as starting on 11/07/22. Ok to complete? Paperwork is due 11/29/22. Please review and advise.

## 2022-11-28 NOTE — Telephone Encounter (Signed)
ok 

## 2022-12-15 ENCOUNTER — Other Ambulatory Visit (HOSPITAL_COMMUNITY): Payer: Self-pay | Admitting: Psychiatry

## 2022-12-15 DIAGNOSIS — F4310 Post-traumatic stress disorder, unspecified: Secondary | ICD-10-CM

## 2022-12-15 DIAGNOSIS — F319 Bipolar disorder, unspecified: Secondary | ICD-10-CM

## 2022-12-17 ENCOUNTER — Other Ambulatory Visit (HOSPITAL_COMMUNITY): Payer: Self-pay | Admitting: Psychiatry

## 2022-12-17 DIAGNOSIS — F319 Bipolar disorder, unspecified: Secondary | ICD-10-CM

## 2022-12-18 ENCOUNTER — Other Ambulatory Visit (HOSPITAL_COMMUNITY): Payer: Self-pay | Admitting: Psychiatry

## 2022-12-18 DIAGNOSIS — F319 Bipolar disorder, unspecified: Secondary | ICD-10-CM

## 2022-12-18 DIAGNOSIS — F411 Generalized anxiety disorder: Secondary | ICD-10-CM

## 2022-12-18 DIAGNOSIS — F4310 Post-traumatic stress disorder, unspecified: Secondary | ICD-10-CM

## 2022-12-19 ENCOUNTER — Telehealth (HOSPITAL_COMMUNITY): Payer: Self-pay | Admitting: *Deleted

## 2022-12-19 DIAGNOSIS — F319 Bipolar disorder, unspecified: Secondary | ICD-10-CM

## 2022-12-19 DIAGNOSIS — F4310 Post-traumatic stress disorder, unspecified: Secondary | ICD-10-CM

## 2022-12-19 NOTE — Telephone Encounter (Signed)
Opened in error

## 2022-12-25 ENCOUNTER — Other Ambulatory Visit (HOSPITAL_COMMUNITY): Payer: Self-pay | Admitting: *Deleted

## 2022-12-25 DIAGNOSIS — F4310 Post-traumatic stress disorder, unspecified: Secondary | ICD-10-CM

## 2022-12-25 DIAGNOSIS — F411 Generalized anxiety disorder: Secondary | ICD-10-CM

## 2022-12-25 DIAGNOSIS — F319 Bipolar disorder, unspecified: Secondary | ICD-10-CM

## 2022-12-25 MED ORDER — SERTRALINE HCL 50 MG PO TABS: 150.0000 mg | ORAL_TABLET | Freq: Every day | ORAL | 1 refills | Status: DC

## 2022-12-25 MED ORDER — LAMOTRIGINE 200 MG PO TABS: 200.0000 mg | ORAL_TABLET | Freq: Every day | ORAL | 0 refills | Status: DC

## 2022-12-25 MED ORDER — GABAPENTIN 100 MG PO CAPS
100.0000 mg | ORAL_CAPSULE | Freq: Two times a day (BID) | ORAL | 1 refills | Status: DC
Start: 2022-12-25 — End: 2022-12-28

## 2022-12-25 MED ORDER — QUETIAPINE FUMARATE 400 MG PO TABS: 400.0000 mg | ORAL_TABLET | Freq: Every day | ORAL | 0 refills | Status: DC

## 2022-12-28 ENCOUNTER — Encounter (HOSPITAL_COMMUNITY): Payer: Self-pay | Admitting: Psychiatry

## 2022-12-28 ENCOUNTER — Telehealth (HOSPITAL_COMMUNITY): Payer: BC Managed Care – PPO | Admitting: Psychiatry

## 2022-12-28 VITALS — Wt 100.0 lb

## 2022-12-28 DIAGNOSIS — F411 Generalized anxiety disorder: Secondary | ICD-10-CM | POA: Diagnosis not present

## 2022-12-28 DIAGNOSIS — F319 Bipolar disorder, unspecified: Secondary | ICD-10-CM | POA: Diagnosis not present

## 2022-12-28 DIAGNOSIS — F4310 Post-traumatic stress disorder, unspecified: Secondary | ICD-10-CM | POA: Diagnosis not present

## 2022-12-28 MED ORDER — SERTRALINE HCL 50 MG PO TABS: 150.0000 mg | ORAL_TABLET | Freq: Every day | ORAL | 2 refills | Status: DC

## 2022-12-28 MED ORDER — LAMOTRIGINE 200 MG PO TABS: 200.0000 mg | ORAL_TABLET | Freq: Every day | ORAL | 2 refills | Status: DC

## 2022-12-28 MED ORDER — QUETIAPINE FUMARATE 400 MG PO TABS: 400.0000 mg | ORAL_TABLET | Freq: Every day | ORAL | 2 refills | Status: DC

## 2022-12-28 MED ORDER — GABAPENTIN 100 MG PO CAPS: 100.0000 mg | ORAL_CAPSULE | Freq: Three times a day (TID) | ORAL | 2 refills | Status: DC
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
## 2023-01-16 ENCOUNTER — Other Ambulatory Visit (HOSPITAL_COMMUNITY): Payer: Self-pay | Admitting: Psychiatry

## 2023-01-16 ENCOUNTER — Other Ambulatory Visit (HOSPITAL_COMMUNITY): Payer: Self-pay

## 2023-01-16 DIAGNOSIS — F411 Generalized anxiety disorder: Secondary | ICD-10-CM

## 2023-01-16 DIAGNOSIS — F4310 Post-traumatic stress disorder, unspecified: Secondary | ICD-10-CM

## 2023-01-16 DIAGNOSIS — F319 Bipolar disorder, unspecified: Secondary | ICD-10-CM

## 2023-02-15 ENCOUNTER — Other Ambulatory Visit (HOSPITAL_COMMUNITY): Payer: Self-pay | Admitting: Psychiatry

## 2023-02-15 DIAGNOSIS — F319 Bipolar disorder, unspecified: Secondary | ICD-10-CM

## 2023-02-15 DIAGNOSIS — F4310 Post-traumatic stress disorder, unspecified: Secondary | ICD-10-CM

## 2023-03-26 ENCOUNTER — Telehealth (HOSPITAL_BASED_OUTPATIENT_CLINIC_OR_DEPARTMENT_OTHER): Payer: BC Managed Care – PPO | Admitting: Psychiatry

## 2023-03-26 ENCOUNTER — Encounter (HOSPITAL_COMMUNITY): Payer: Self-pay | Admitting: Psychiatry

## 2023-03-26 VITALS — Wt 100.0 lb

## 2023-03-26 DIAGNOSIS — F4310 Post-traumatic stress disorder, unspecified: Secondary | ICD-10-CM | POA: Diagnosis not present

## 2023-03-26 DIAGNOSIS — F319 Bipolar disorder, unspecified: Secondary | ICD-10-CM

## 2023-03-26 DIAGNOSIS — F411 Generalized anxiety disorder: Secondary | ICD-10-CM | POA: Diagnosis not present

## 2023-03-26 MED ORDER — LAMOTRIGINE 200 MG PO TABS: 200.0000 mg | ORAL_TABLET | Freq: Every day | ORAL | 1 refills | Status: DC

## 2023-03-26 MED ORDER — QUETIAPINE FUMARATE 400 MG PO TABS: 400.0000 mg | ORAL_TABLET | Freq: Every day | ORAL | 1 refills | Status: DC

## 2023-03-26 MED ORDER — LAMOTRIGINE 25 MG PO TABS: 25.0000 mg | ORAL_TABLET | Freq: Two times a day (BID) | ORAL | 1 refills | Status: DC

## 2023-03-26 MED ORDER — SERTRALINE HCL 50 MG PO TABS: 150.0000 mg | ORAL_TABLET | Freq: Every day | ORAL | 1 refills | Status: DC
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
## 2023-04-20 ENCOUNTER — Other Ambulatory Visit (HOSPITAL_COMMUNITY): Payer: Self-pay | Admitting: Psychiatry

## 2023-04-20 DIAGNOSIS — F319 Bipolar disorder, unspecified: Secondary | ICD-10-CM

## 2023-05-08 ENCOUNTER — Telehealth (HOSPITAL_BASED_OUTPATIENT_CLINIC_OR_DEPARTMENT_OTHER): Payer: BC Managed Care – PPO | Admitting: Psychiatry

## 2023-05-08 ENCOUNTER — Encounter (HOSPITAL_COMMUNITY): Payer: Self-pay | Admitting: Psychiatry

## 2023-05-08 DIAGNOSIS — F319 Bipolar disorder, unspecified: Secondary | ICD-10-CM

## 2023-05-08 DIAGNOSIS — F4310 Post-traumatic stress disorder, unspecified: Secondary | ICD-10-CM

## 2023-05-08 DIAGNOSIS — F411 Generalized anxiety disorder: Secondary | ICD-10-CM | POA: Diagnosis not present

## 2023-05-08 MED ORDER — LAMOTRIGINE 25 MG PO TABS: 25.0000 mg | ORAL_TABLET | Freq: Two times a day (BID) | ORAL | 1 refills | Status: DC

## 2023-05-08 MED ORDER — SERTRALINE HCL 50 MG PO TABS: 150.0000 mg | ORAL_TABLET | Freq: Every day | ORAL | 1 refills | Status: DC

## 2023-05-08 MED ORDER — QUETIAPINE FUMARATE 400 MG PO TABS: 400.0000 mg | ORAL_TABLET | Freq: Every day | ORAL | 1 refills | Status: DC

## 2023-05-08 MED ORDER — LAMOTRIGINE 200 MG PO TABS: 200.0000 mg | ORAL_TABLET | Freq: Every day | ORAL | 1 refills | Status: DC
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
## 2023-06-21 ENCOUNTER — Encounter (HOSPITAL_COMMUNITY): Payer: Self-pay

## 2023-06-28 ENCOUNTER — Ambulatory Visit: Attending: Internal Medicine | Admitting: Internal Medicine

## 2023-06-28 ENCOUNTER — Other Ambulatory Visit: Payer: Self-pay | Admitting: Internal Medicine

## 2023-06-28 ENCOUNTER — Encounter: Payer: Self-pay | Admitting: Internal Medicine

## 2023-06-28 VITALS — BP 107/69 | HR 90 | Temp 98.2°F | Ht 64.0 in | Wt 124.0 lb

## 2023-06-28 DIAGNOSIS — Z0000 Encounter for general adult medical examination without abnormal findings: Secondary | ICD-10-CM | POA: Diagnosis not present

## 2023-06-28 DIAGNOSIS — D7589 Other specified diseases of blood and blood-forming organs: Secondary | ICD-10-CM

## 2023-06-28 DIAGNOSIS — F1290 Cannabis use, unspecified, uncomplicated: Secondary | ICD-10-CM

## 2023-06-28 DIAGNOSIS — N6310 Unspecified lump in the right breast, unspecified quadrant: Secondary | ICD-10-CM

## 2023-06-28 DIAGNOSIS — N920 Excessive and frequent menstruation with regular cycle: Secondary | ICD-10-CM

## 2023-06-28 DIAGNOSIS — H6122 Impacted cerumen, left ear: Secondary | ICD-10-CM

## 2023-06-28 DIAGNOSIS — F319 Bipolar disorder, unspecified: Secondary | ICD-10-CM

## 2023-06-28 NOTE — Progress Notes (Addendum)
 Patient ID: Alexa White, female    DOB: Dec 29, 1985  MRN: [id number]  CC: Annual Exam (Physical. Valli Gaw of lump on R breast X2 mo /)   Subjective: Alexa White is a 38 y.o. female who presents for physical.  Her 33 yr old son is with her. Her concerns today include:  Hx of Mdd/GAD/PTSD, Bipolar 1, Fatty Liver, IBS  Discussed the use of AI scribe software for clinical note transcription with the patient, who gave verbal consent to proceed.  History of Present Illness Alexa White is a 38 year old female who presents for an annual physical exam and evaluation of a breast lump. She is accompanied by her young son.  She discovered a lump in her right breast approximately two months ago, describing it as a 'little bump' that she can feel intermittently. Similar lumps have appeared on both sides and 'come and go.' There is no personal or family history of breast cancer, and she has never had a breast biopsy.  She is on lamotrigine , Seroquel , and Zoloft  for PTSD, anxiety, and bipolar I disorder. She suspects lamotrigine  might be causing the breast lump. Her menstrual cycles are  heavier, and longer since starting psychiatric medications, with periods now lasting about seven days compared to five previously. She attributes these changes to quetiapine .  Last pap was was 2023 and showed ASCUS; plan was for repeat in 1 yr  She smokes marijuana daily and drinks a 32-ounce beer after work. She has been smoking marijuana since seventh grade and only stopped during pregnancies. She works at an Product/process development scientist. No history of smoking cigarettes or using other street substances.  She experiences occasional right shoulder blade pain when taking deep breaths. No chronic cough, chest pain, or blood in stools.  In regards to bipolar disorder, GAD, she feels she is stable on her current medications.  She is followed by behavioral health Dr. Carlos Chesterfield.   Patient Active Problem List   Diagnosis Date Noted    Bipolar 1 disorder, mixed, moderate (HCC) 04/10/2022   PTSD (post-traumatic stress disorder) 02/21/2022   Alcohol use disorder 11/22/2021   GAD (generalized anxiety disorder) 09/20/2021   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   Unintended weight loss 09/20/2021   DUB (dysfunctional uterine bleeding) 09/20/2021   Request for sterilization 07/27/2018   Delivery of viable fetus in abdominal pregnancy 07/26/2018   Normal labor and delivery 07/26/2018   GDM (gestational diabetes mellitus) 05/28/2018   Anxiety and depression 05/23/2018   Supervision of high-risk pregnancy, third trimester 02/20/2018     Current Outpatient Medications on File Prior to Visit  Medication Sig Dispense Refill   lamoTRIgine  (LAMICTAL ) 200 MG tablet Take 1 tablet (200 mg total) by mouth daily. 33 tablet 1   lamoTRIgine  (LAMICTAL ) 25 MG tablet Take 1 tablet (25 mg total) by mouth 2 (two) times daily. 60 tablet 1   QUEtiapine  (SEROQUEL ) 400 MG tablet Take 1 tablet (400 mg total) by mouth at bedtime. 33 tablet 1   sertraline  (ZOLOFT ) 50 MG tablet Take 3 tablets (150 mg total) by mouth daily. 90 tablet 1   gabapentin  (NEURONTIN ) 100 MG capsule Take 1 capsule (100 mg total) by mouth 3 (three) times daily. (Patient not taking: Reported on 06/28/2023) 90 capsule 2   No current facility-administered medications on file prior to visit.    No Known Allergies  Social History   Socioeconomic History   Marital status: Married    Spouse name: Not on file   Number of  children: 3   Years of education: Not on file   Highest education level: High school graduate  Occupational History   Not on file  Tobacco Use   Smoking status: Former    Current packs/day: 0.00    Average packs/day: 1 pack/day for 3.0 years (3.0 ttl pk-yrs)    Types: Cigarettes    Start date: 06/01/2011    Quit date: 06/01/2014    Years since quitting: 9.0   Smokeless tobacco: Never   Tobacco comments:    VAPES "weed"  Vaping Use   Vaping  status: Never Used  Substance and Sexual Activity   Alcohol use: Yes    Comment: socially   Drug use: Yes    Types: Marijuana   Sexual activity: Yes    Partners: Male    Birth control/protection: Surgical    Comment: tubal  Other Topics Concern   Not on file  Social History Narrative   Not on file   Social Drivers of Health   Financial Resource Strain: Low Risk  (06/28/2023)   Overall Financial Resource Strain (CARDIA)    Difficulty of Paying Living Expenses: Not hard at all  Food Insecurity: No Food Insecurity (06/28/2023)   Hunger Vital Sign    Worried About Running Out of Food in the Last Year: Never true    Ran Out of Food in the Last Year: Never true  Transportation Needs: No Transportation Needs (06/28/2023)   PRAPARE - Administrator, Civil Service (Medical): No    Lack of Transportation (Non-Medical): No  Physical Activity: Inactive (06/28/2023)   Exercise Vital Sign    Days of Exercise per Week: 0 days    Minutes of Exercise per Session: 0 min  Stress: No Stress Concern Present (06/28/2023)   [employer] of Occupational Health - Occupational Stress Questionnaire    Feeling of Stress : Not at all  Social Connections: Moderately Isolated (06/28/2023)   Social Connection and Isolation Panel [NHANES]    Frequency of Communication with Friends and Family: Twice a week    Frequency of Social Gatherings with Friends and Family: Twice a week    Attends Religious Services: Never    Database administrator or Organizations: No    Attends Banker Meetings: Never    Marital Status: Married  Catering manager Violence: Not At Risk (06/28/2023)   Humiliation, Afraid, Rape, and Kick questionnaire    Fear of Current or Ex-Partner: No    Emotionally Abused: No    Physically Abused: No    Sexually Abused: No    Family History  Problem Relation Age of Onset   Mental illness Mother        bipolar   Heart disease Father     Past Surgical History:   Procedure Laterality Date   nose repair     broken nose- had to be reset   TUBAL LIGATION N/A 07/26/2018   Procedure: POST PARTUM TUBAL LIGATION;  Surgeon: Rik Chasten, MD;  Location: [facility] ORS;  Service: Gynecology;  Laterality: N/A;   WISDOM TOOTH EXTRACTION  2011    ROS: Review of Systems  Constitutional:        Not much exercise other than the walking she does at work. Feels she can do better with eating habits.  HENT:  Negative for hearing loss, sinus pressure and trouble swallowing.        Has 2 teeth that are broken off in the gum that needs to  come out.  She has seen a dentist but states that it is cost prohibitive.  Eyes:        Had an eye exam a few weeks ago.  Prescribed glasses.   Negative except as stated above  PHYSICAL EXAM: BP 107/69 (BP Location: Left Arm, Patient Position: Sitting, Cuff Size: Normal)   Pulse 90   Temp 98.2 F (36.8 C) (Oral)   Ht 5\' 4"  (1.626 m)   Wt 124 lb (56.2 kg)   SpO2 99%   BMI 21.28 kg/m   Wt Readings from Last 3 Encounters:  06/28/23 124 lb (56.2 kg)  05/08/23 100 lb (45.4 kg)  03/26/23 100 lb (45.4 kg)    Physical Exam  General appearance - alert, well appearing, young Caucasian female and in no distress Mental status - normal mood, behavior, speech, dress, motor activity, and thought processes Eyes - pupils equal and reactive, extraocular eye movements intact Ears -small amount of hard wax in the left ear canal obscuring view of the tympanic membrane.  Right ear canal and tympanic membrane within normal limits. Nose - normal and patent, no erythema, discharge or polyps Mouth - mucous membranes moist, pharynx normal without lesions Neck - supple, no significant adenopathy Lymphatics - no palpable lymphadenopathy, no hepatosplenomegaly Chest - clear to auscultation, no wheezes, rales or rhonchi, symmetric air entry Heart - normal rate, regular rhythm, normal S1, S2, no murmurs, rubs, clicks or gallops Abdomen - soft,  nontender, nondistended, no masses or organomegaly Breasts -CMA Geronimo Krabbe present: No abnormal masses palpated in either breast.  No discharge expressed from the nipples. Pelvic -deferred for next visit per patient's preference Musculoskeletal - no joint tenderness, deformity or swelling Extremities - peripheral pulses normal, no pedal edema, no clubbing or cyanosis Skin -mild acne noted on the face      Latest Ref Rng & Units 09/12/2021   11:12 PM 06/28/2018    8:44 PM 02/28/2018    3:45 PM  CMP  Glucose 70 - 99 mg/dL 409  74  59   BUN 6 - 20 mg/dL 12  8  9    Creatinine 0.44 - 1.00 mg/dL 8.11  9.14  7.82   Sodium 135 - 145 mmol/L 136  137  138   Potassium 3.5 - 5.1 mmol/L 3.7  3.4  3.8   Chloride 98 - 111 mmol/L 106  104  103   CO2 22 - 32 mmol/L 24  21  21    Calcium 8.9 - 10.3 mg/dL 9.0  8.6  8.8   Total Protein 6.5 - 8.1 g/dL 6.6  6.0  6.1   Total Bilirubin 0.3 - 1.2 mg/dL 0.9  0.5  <9.5   Alkaline Phos 38 - 126 U/L 45  147  48   AST 15 - 41 U/L 15  15  16    ALT 0 - 44 U/L 11  15  19     Lipid Panel  No results found for: "CHOL", "TRIG", "HDL", "CHOLHDL", "VLDL", "LDLCALC", "LDLDIRECT"  CBC    Component Value Date/Time   WBC 10.3 09/12/2021 2312   RBC 3.83 (L) 09/12/2021 2312   HGB 12.9 09/12/2021 2312   HGB 10.1 (L) 05/23/2018 0813   HGB 10.6 06/19/2014 0000   HCT 37.3 09/12/2021 2312   HCT 28.4 (L) 05/23/2018 0813   PLT 283 09/12/2021 2312   PLT 276 05/23/2018 0813   MCV 97.4 09/12/2021 2312   MCV 97 05/23/2018 0813   MCH 33.7 09/12/2021 2312  MCHC 34.6 09/12/2021 2312   RDW 11.2 (L) 09/12/2021 2312   RDW 11.6 (L) 05/23/2018 0813   LYMPHSABS 3.8 09/12/2021 2312   LYMPHSABS 2.3 02/28/2018 1545   MONOABS 0.8 09/12/2021 2312   EOSABS 0.2 09/12/2021 2312   EOSABS 0.2 02/28/2018 1545   BASOSABS 0.1 09/12/2021 2312   BASOSABS 0.0 02/28/2018 1545    ASSESSMENT AND PLAN: 1. Annual physical exam (Primary) Patient advised to eliminate sugary drinks from the  diet, cut back on portion sizes especially of white carbohydrates, eat more white lean meat like chicken [place] and seafood instead of beef or pork and incorporate fresh fruits and vegetables into the diet daily. -Discussed and encourage regular exercise with goal of getting in about 150 minutes/week total of moderate intensity exercise. Advised to cut back on beer consumption to no more than one 12 ounce can a day or no more than 2 on special occasions.  Discussed health risks associated with excessive alcohol use. - Comprehensive metabolic panel with GFR - Lipid panel  2. Marijuana use Discourage use.  Discussed impact that can have on health.  3. Impacted cerumen, left ear Recommend using some over-the-counter wax softener  4. Menorrhagia with regular cycle Will discuss further on next visit when we do her Pap smear. - CBC  5. Mass of left breast, unspecified quadrant No mass appreciated in the breast at this time and pt herself was unable to locate but stated she felt in earlier this morning.  Advised that I do not think any of her psych medications may be causing intermittent breast lumps.  Advised to pay attention to see if they come and go with her cycles which could suggest cysts.  Will get Dx MMG - MM 3D DIAGNOSTIC MAMMOGRAM UNILATERAL RIGHT BREAST; Future  6. Bipolar 1 ds She is plugged in with behavioral health and feels she is stable on current medications.  Patient was given the opportunity to ask questions.  Patient verbalized understanding of the plan and was able to repeat key elements of the plan.   This documentation was completed using Paediatric nurse.  Any transcriptional errors are unintentional.  Orders Placed This Encounter  Procedures   MM 3D DIAGNOSTIC MAMMOGRAM UNILATERAL RIGHT BREAST   CBC   Comprehensive metabolic panel with GFR   Lipid panel     Requested Prescriptions    No prescriptions requested or ordered in this encounter     Return in about 7 weeks (around 08/16/2023) for PAP -was due since 09/2022.  Concetta Dee, MD, FACP

## 2023-06-28 NOTE — Patient Instructions (Addendum)
 VISIT SUMMARY:  Today, you came in for your annual physical exam and to evaluate a lump you found in your right breast. We discussed your current medications, menstrual cycle changes, and lifestyle habits, including marijuana use and alcohol consumption. We also addressed an issue with earwax in your left ear.  YOUR PLAN:  -BREAST LUMP: You have a lump in your right breast that you noticed two months ago. There is no family or personal history of breast cancer. We have ordered a mammogram  -IRREGULAR MENSTRUAL CYCLES: Your menstrual cycles have become heavier and longer since starting your psychiatric medications. We will order blood tests to check your blood count, cholesterol, kidney, and liver function to understand the cause better.  -BIPOLAR I DISORDER: You have Bipolar I disorder, which is being managed with your current medications. You reported that you are stable on this regimen.  -PTSD: You have PTSD, which is being managed with your current medications. You reported that you are stable on this regimen.  -ANXIETY DISORDER: You have an anxiety disorder, which is being managed with your current medications. You reported that you are stable on this regimen.  -DAILY MARIJUANA USE: You use marijuana daily for appetite and temper. We discussed the potential negative impacts, including sedation and cognitive effects.  -EXCESSIVE ALCOHOL CONSUMPTION: You drink a 32-ounce beer daily, which is more than the recommended amount. We discussed the risks, including nerve damage, liver cirrhosis, and cancer. I encourage you to reduce your intake to no more than one 12-ounce beer per day.  -IMPACTED EARWAX: You have hard earwax in your left ear, which is causing occasional water retention and hearing difficulty. I recommend using an over-the-counter wax softener like Debrox for 3-4 days to help remove the earwax.  INSTRUCTIONS:  Please follow up with the recommended blood tests to check your blood  count, cholesterol, kidney, and liver function. Use the over-the-counter wax softener for your ear as directed. If the breast lump persists or you notice any changes, please schedule a follow-up appointment.  Preventive Care 65-44 Years Old, Female Preventive care refers to lifestyle choices and visits with your health care provider that can promote health and wellness. Preventive care visits are also called wellness exams. What can I expect for my preventive care visit? Counseling During your preventive care visit, your health care provider may ask about your: Medical history, including: Past medical problems. Family medical history. Pregnancy history. Current health, including: Menstrual cycle. Method of birth control. Emotional well-being. Home life and relationship well-being. Sexual activity and sexual health. Lifestyle, including: Alcohol, nicotine or tobacco, and drug use. Access to firearms. Diet, exercise, and sleep habits. Work and work Astronomer. Sunscreen use. Safety issues such as seatbelt and bike helmet use. Physical exam Your health care provider may check your: Height and weight. These may be used to calculate your BMI (body mass index). BMI is a measurement that tells if you are at a healthy weight. Waist circumference. This measures the distance around your waistline. This measurement also tells if you are at a healthy weight and may help predict your risk of certain diseases, such as type 2 diabetes and high blood pressure. Heart rate and blood pressure. Body temperature. Skin for abnormal spots. What immunizations do I need?  Vaccines are usually given at various ages, according to a schedule. Your health care provider will recommend vaccines for you based on your age, medical history, and lifestyle or other factors, such as travel or where you work. What tests do  I need? Screening Your health care provider may recommend screening tests for certain  conditions. This may include: Pelvic exam and Pap test. Lipid and cholesterol levels. Diabetes screening. This is done by checking your blood sugar (glucose) after you have not eaten for a while (fasting). Hepatitis B test. Hepatitis C test. HIV (human immunodeficiency virus) test. STI (sexually transmitted infection) testing, if you are at risk. BRCA-related cancer screening. This may be done if you have a family history of breast, ovarian, tubal, or peritoneal cancers. Talk with your health care provider about your test results, treatment options, and if necessary, the need for more tests. Follow these instructions at home: Eating and drinking  Eat a healthy diet that includes fresh fruits and vegetables, whole grains, lean protein, and low-fat dairy products. Take vitamin and mineral supplements as recommended by your health care provider. Do not drink alcohol if: Your health care provider tells you not to drink. You are pregnant, may be pregnant, or are planning to become pregnant. If you drink alcohol: Limit how much you have to 0-1 drink a day. Know how much alcohol is in your drink. In the [place] one drink equals one 12 oz bottle of beer (355 mL), one 5 oz glass of wine (148 mL), or one 1 oz glass of hard liquor (44 mL). Lifestyle Brush your teeth every morning and night with fluoride toothpaste. Floss one time each day. Exercise for at least 30 minutes 5 or more days each week. Do not use any products that contain nicotine or tobacco. These products include cigarettes, chewing tobacco, and vaping devices, such as e-cigarettes. If you need help quitting, ask your health care provider. Do not use drugs. If you are sexually active, practice safe sex. Use a condom or other form of protection to prevent STIs. If you do not wish to become pregnant, use a form of birth control. If you plan to become pregnant, see your health care provider for a prepregnancy visit. Find healthy ways to  manage stress, such as: Meditation, yoga, or listening to music. Journaling. Talking to a trusted person. Spending time with friends and family. Minimize exposure to UV radiation to reduce your risk of skin cancer. Safety Always wear your seat belt while driving or riding in a vehicle. Do not drive: If you have been drinking alcohol. Do not ride with someone who has been drinking. If you have been using any mind-altering substances or drugs. While texting. When you are tired or distracted. Wear a helmet and other protective equipment during sports activities. If you have firearms in your house, make sure you follow all gun safety procedures. Seek help if you have been physically or sexually abused. What's next? Go to your health care provider once a year for an annual wellness visit. Ask your health care provider how often you should have your eyes and teeth checked. Stay up to date on all vaccines. This information is not intended to replace advice given to you by your health care provider. Make sure you discuss any questions you have with your health care provider. Document Revised: 07/28/2020 Document Reviewed: 07/28/2020 Elsevier Patient Education  2024 [employer].

## 2023-06-29 ENCOUNTER — Ambulatory Visit: Payer: Self-pay | Admitting: Internal Medicine

## 2023-06-29 LAB — COMPREHENSIVE METABOLIC PANEL WITH GFR
ALT: 11 IU/L (ref 0–32)
AST: 16 IU/L (ref 0–40)
Albumin: 4.5 g/dL (ref 3.9–4.9)
Alkaline Phosphatase: 76 IU/L (ref 44–121)
BUN/Creatinine Ratio: 10 (ref 9–23)
BUN: 7 mg/dL (ref 6–20)
Bilirubin Total: 0.3 mg/dL (ref 0.0–1.2)
CO2: 23 mmol/L (ref 20–29)
Calcium: 9.2 mg/dL (ref 8.7–10.2)
Chloride: 100 mmol/L (ref 96–106)
Creatinine, Ser: 0.7 mg/dL (ref 0.57–1.00)
Globulin, Total: 2.4 g/dL (ref 1.5–4.5)
Glucose: 91 mg/dL (ref 70–99)
Potassium: 3.8 mmol/L (ref 3.5–5.2)
Sodium: 137 mmol/L (ref 134–144)
Total Protein: 6.9 g/dL (ref 6.0–8.5)
eGFR: 115 mL/min/{1.73_m2} (ref >59)

## 2023-06-29 LAB — LIPID PANEL
Chol/HDL Ratio: 3.7 ratio (ref 0.0–4.4)
Cholesterol, Total: 235 mg/dL — ABNORMAL HIGH (ref 100–199)
HDL: 63 mg/dL (ref >39)
LDL Chol Calc (NIH): 148 mg/dL — ABNORMAL HIGH (ref 0–99)
Triglycerides: 138 mg/dL (ref 0–149)
VLDL Cholesterol Cal: 24 mg/dL (ref 5–40)

## 2023-06-29 LAB — CBC
Hematocrit: 36.2 % (ref 34.0–46.6)
Hemoglobin: 12.1 g/dL (ref 11.1–15.9)
MCH: 33.4 pg — ABNORMAL HIGH (ref 26.6–33.0)
MCHC: 33.4 g/dL (ref 31.5–35.7)
MCV: 100 fL — ABNORMAL HIGH (ref 79–97)
Platelets: 265 10*3/uL (ref 150–450)
RBC: 3.62 x10E6/uL — ABNORMAL LOW (ref 3.77–5.28)
RDW: 12.1 % (ref 11.7–15.4)
WBC: 5.6 10*3/uL (ref 3.4–10.8)

## 2023-06-29 NOTE — Addendum Note (Signed)
 Addended by: Concetta Dee B on: 06/29/2023 07:07 AM   Modules accepted: Orders

## 2023-06-30 LAB — SPECIMEN STATUS REPORT

## 2023-06-30 LAB — B12 AND FOLATE PANEL
Folate: 8.8 ng/mL (ref >3.0)
Vitamin B-12: 525 pg/mL (ref 232–1245)

## 2023-07-10 ENCOUNTER — Encounter (HOSPITAL_COMMUNITY): Payer: Self-pay | Admitting: Psychiatry

## 2023-07-10 ENCOUNTER — Telehealth (HOSPITAL_BASED_OUTPATIENT_CLINIC_OR_DEPARTMENT_OTHER): Admitting: Psychiatry

## 2023-07-10 DIAGNOSIS — F411 Generalized anxiety disorder: Secondary | ICD-10-CM | POA: Diagnosis not present

## 2023-07-10 DIAGNOSIS — F4310 Post-traumatic stress disorder, unspecified: Secondary | ICD-10-CM | POA: Diagnosis not present

## 2023-07-10 DIAGNOSIS — F319 Bipolar disorder, unspecified: Secondary | ICD-10-CM

## 2023-07-10 MED ORDER — SERTRALINE HCL 50 MG PO TABS: 150.0000 mg | ORAL_TABLET | Freq: Every day | ORAL | 1 refills | Status: DC

## 2023-07-10 MED ORDER — QUETIAPINE FUMARATE 400 MG PO TABS: 400.0000 mg | ORAL_TABLET | Freq: Every day | ORAL | 1 refills | Status: DC

## 2023-07-10 MED ORDER — LAMOTRIGINE 150 MG PO TABS: 300.0000 mg | ORAL_TABLET | Freq: Every day | ORAL | 1 refills | Status: DC
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
## 2023-08-06 ENCOUNTER — Ambulatory Visit
Admission: RE | Admit: 2023-08-06 | Discharge: 2023-08-06 | Disposition: A | Discharge status: Home / Self Care | Source: Ambulatory Visit | Attending: Internal Medicine | Admitting: Internal Medicine

## 2023-08-06 ENCOUNTER — Ambulatory Visit: Admission: RE | Admit: 2023-08-06 | Source: Ambulatory Visit

## 2023-08-06 DIAGNOSIS — N644 Mastodynia: Secondary | ICD-10-CM | POA: Diagnosis not present

## 2023-08-06 DIAGNOSIS — N6310 Unspecified lump in the right breast, unspecified quadrant: Secondary | ICD-10-CM

## 2023-08-27 ENCOUNTER — Telehealth: Payer: Self-pay

## 2023-08-27 NOTE — Telephone Encounter (Signed)
 The patient has been scheduled for the next available appointment date 09/11/2023 at 9:10 am. Patient has confirmed and acknowledged of the appointment.

## 2023-08-27 NOTE — Telephone Encounter (Signed)
 Copied from CRM [id number]. Topic: General - Call Back - No Documentation >> Aug 27, 2023  2:41 PM Jayma L wrote: Reason for CRM: patient called and stated she missed a callback, advised she has a upcoming appt tomorrow at 930am. Patient said she's on her period and not sure if she needs to reschedule her pap smear appt. Asking for a callback to know if a pap semar can happen while on period.

## 2023-08-28 ENCOUNTER — Ambulatory Visit: Admitting: Internal Medicine

## 2023-09-04 ENCOUNTER — Telehealth (HOSPITAL_COMMUNITY): Admitting: Psychiatry

## 2023-09-04 ENCOUNTER — Encounter (HOSPITAL_COMMUNITY): Payer: Self-pay | Admitting: Psychiatry

## 2023-09-04 VITALS — Wt 118.0 lb

## 2023-09-04 DIAGNOSIS — F319 Bipolar disorder, unspecified: Secondary | ICD-10-CM | POA: Diagnosis not present

## 2023-09-04 DIAGNOSIS — F4310 Post-traumatic stress disorder, unspecified: Secondary | ICD-10-CM

## 2023-09-04 DIAGNOSIS — F411 Generalized anxiety disorder: Secondary | ICD-10-CM | POA: Diagnosis not present

## 2023-09-04 MED ORDER — LAMOTRIGINE 150 MG PO TABS: 300.0000 mg | ORAL_TABLET | Freq: Every day | ORAL | 2 refills | Status: DC

## 2023-09-04 MED ORDER — SERTRALINE HCL 50 MG PO TABS
150.0000 mg | ORAL_TABLET | Freq: Every day | ORAL | 2 refills | Status: DC
Start: 2023-09-04 — End: 2023-12-04

## 2023-09-04 MED ORDER — QUETIAPINE FUMARATE 400 MG PO TABS: 400.0000 mg | ORAL_TABLET | Freq: Every day | ORAL | 2 refills | Status: DC
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
## 2023-09-10 ENCOUNTER — Telehealth: Payer: Self-pay | Admitting: Internal Medicine

## 2023-09-10 NOTE — Telephone Encounter (Signed)
 Called pt to confirm appt for 7/29 LVM

## 2023-09-11 ENCOUNTER — Encounter: Payer: Self-pay | Admitting: Internal Medicine

## 2023-09-11 ENCOUNTER — Ambulatory Visit: Attending: Internal Medicine | Admitting: Internal Medicine

## 2023-09-11 ENCOUNTER — Other Ambulatory Visit (HOSPITAL_COMMUNITY)
Admission: RE | Admit: 2023-09-11 | Discharge: 2023-09-11 | Disposition: A | Discharge status: Home / Self Care | Source: Ambulatory Visit | Attending: Internal Medicine | Admitting: Internal Medicine

## 2023-09-11 VITALS — BP 119/71 | HR 90 | Temp 98.5°F | Ht 64.0 in | Wt 123.0 lb

## 2023-09-11 DIAGNOSIS — Z124 Encounter for screening for malignant neoplasm of cervix: Secondary | ICD-10-CM | POA: Insufficient documentation

## 2023-09-11 DIAGNOSIS — Z1151 Encounter for screening for human papillomavirus (HPV): Secondary | ICD-10-CM | POA: Insufficient documentation

## 2023-09-11 DIAGNOSIS — Z113 Encounter for screening for infections with a predominantly sexual mode of transmission: Secondary | ICD-10-CM | POA: Diagnosis not present

## 2023-09-11 NOTE — Progress Notes (Signed)
 Patient ID: Alexa White, female    DOB: 1985-12-19  MRN: [id number]  CC: Gynecologic Exam (Pap. /No questions / concerns/)   Subjective: Alexa White is a 38 y.o. female who presents for chronic ds management. Her concerns today include:  Hx of Mdd/GAD/PTSD, Bipolar 1, Fatty Liver, IBS   GYN History:  Pt is G4P3 (1 miscarriage) Any hx of abn paps?: last pap 2 yrs ago had ASCUS Menses regular or irregular?: menses irregular. Sometimes 2x/mth. Does not keep log.  LNMP 08/25/2023 How long does menses last? 6 days Menstrual flow light or heavy?: heavy first 3 days.  Method of birth control?:  had tubal ligation Any vaginal dischg at this time?: no Dysuria?: no Any hx of STI?: no Sexually active with how many partners: just husband Desires STI screen: yes Last MMG: 07/2023. NL. Family hx of uterine, cervical or breast cancer?:  no   Patient Active Problem List   Diagnosis Date Noted   Bipolar 1 disorder, mixed, moderate (HCC) 04/10/2022   PTSD (post-traumatic stress disorder) 02/21/2022   Alcohol use disorder 11/22/2021   GAD (generalized anxiety disorder) 09/20/2021   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   Unintended weight loss 09/20/2021   DUB (dysfunctional uterine bleeding) 09/20/2021   Request for sterilization 07/27/2018   Delivery of viable fetus in abdominal pregnancy 07/26/2018   Normal labor and delivery 07/26/2018   GDM (gestational diabetes mellitus) 05/28/2018   Anxiety and depression 05/23/2018   Supervision of high-risk pregnancy, third trimester 02/20/2018     Current Outpatient Medications on File Prior to Visit  Medication Sig Dispense Refill   lamoTRIgine  (LAMICTAL ) 150 MG tablet Take 2 tablets (300 mg total) by mouth daily. 60 tablet 2   QUEtiapine  (SEROQUEL ) 400 MG tablet Take 1 tablet (400 mg total) by mouth at bedtime. 33 tablet 2   sertraline  (ZOLOFT ) 50 MG tablet Take 3 tablets (150 mg total) by mouth daily. 90 tablet 2   No current  facility-administered medications on file prior to visit.    No Known Allergies  Social History   Socioeconomic History   Marital status: Married    Spouse name: Not on file   Number of children: 3   Years of education: Not on file   Highest education level: High school graduate  Occupational History   Not on file  Tobacco Use   Smoking status: Former    Current packs/day: 0.00    Average packs/day: 1 pack/day for 3.0 years (3.0 ttl pk-yrs)    Types: Cigarettes    Start date: 06/01/2011    Quit date: 06/01/2014    Years since quitting: 9.2   Smokeless tobacco: Never   Tobacco comments:    VAPES weed  Vaping Use   Vaping status: Never Used  Substance and Sexual Activity   Alcohol use: Yes    Comment: socially   Drug use: Yes    Types: Marijuana   Sexual activity: Yes    Partners: Male    Birth control/protection: Surgical    Comment: tubal  Other Topics Concern   Not on file  Social History Narrative   Not on file   Social Drivers of Health   Financial Resource Strain: Low Risk  (06/28/2023)   Overall Financial Resource Strain (CARDIA)    Difficulty of Paying Living Expenses: Not hard at all  Food Insecurity: No Food Insecurity (06/28/2023)   Hunger Vital Sign    Worried About Running Out of Food in the Last Year:  Never true    Ran Out of Food in the Last Year: Never true  Transportation Needs: No Transportation Needs (06/28/2023)   PRAPARE - Administrator, Civil Service (Medical): No    Lack of Transportation (Non-Medical): No  Physical Activity: Inactive (06/28/2023)   Exercise Vital Sign    Days of Exercise per Week: 0 days    Minutes of Exercise per Session: 0 min  Stress: No Stress Concern Present (06/28/2023)   [employer] of Occupational Health - Occupational Stress Questionnaire    Feeling of Stress : Not at all  Social Connections: Moderately Isolated (06/28/2023)   Social Connection and Isolation Panel    Frequency of Communication  with Friends and Family: Twice a week    Frequency of Social Gatherings with Friends and Family: Twice a week    Attends Religious Services: Never    Database administrator or Organizations: No    Attends Banker Meetings: Never    Marital Status: Married  Catering manager Violence: Not At Risk (06/28/2023)   Humiliation, Afraid, Rape, and Kick questionnaire    Fear of Current or Ex-Partner: No    Emotionally Abused: No    Physically Abused: No    Sexually Abused: No    Family History  Problem Relation Age of Onset   Mental illness Mother        bipolar   Heart disease Father     Past Surgical History:  Procedure Laterality Date   nose repair     broken nose- had to be reset   TUBAL LIGATION N/A 07/26/2018   Procedure: POST PARTUM TUBAL LIGATION;  Surgeon: Cris Burnard DEL, MD;  Location: [facility] LD [facility]S;  Service: Gynecology;  Laterality: N/A;   WISDOM TOOTH EXTRACTION  2011    ROS: Review of Systems Negative except as stated above  PHYSICAL EXAM: BP 119/71 (BP Location: Left Arm, Patient Position: Sitting, Cuff Size: Small)   Pulse 90   Temp 98.5 F (36.9 C) (Oral)   Ht 5' 4 (1.626 m)   Wt 123 lb (55.8 kg)   SpO2 98%   BMI 21.11 kg/m   Physical Exam  General appearance - alert, well appearing, and in no distress Mental status - normal mood, behavior, speech, dress, motor activity, and thought processes Pelvic - CMA Clarisa present: normal external genitalia, vulva, vagina, cervix, uterus and adnexa      Latest Ref Rng & Units 06/28/2023   11:22 AM 09/12/2021   11:12 PM 06/28/2018    8:44 PM  CMP  Glucose 70 - 99 mg/dL 91  896  74   BUN 6 - 20 mg/dL 7  12  8    Creatinine 0.57 - 1.00 mg/dL 9.29  9.35  9.53   Sodium 134 - 144 mmol/L 137  136  137   Potassium 3.5 - 5.2 mmol/L 3.8  3.7  3.4   Chloride 96 - 106 mmol/L 100  106  104   CO2 20 - 29 mmol/L 23  24  21    Calcium 8.7 - 10.2 mg/dL 9.2  9.0  8.6   Total Protein 6.0 - 8.5 g/dL 6.9  6.6  6.0    Total Bilirubin 0.0 - 1.2 mg/dL 0.3  0.9  0.5   Alkaline Phos 44 - 121 IU/L 76  45  147   AST 0 - 40 IU/L 16  15  15    ALT 0 - 32 IU/L 11  11  15  Lipid Panel     Component Value Date/Time   CHOL 235 (H) 06/28/2023 1122   TRIG 138 06/28/2023 1122   HDL 63 06/28/2023 1122   CHOLHDL 3.7 06/28/2023 1122   LDLCALC 148 (H) 06/28/2023 1122    CBC    Component Value Date/Time   WBC 5.6 06/28/2023 1122   WBC 10.3 09/12/2021 2312   RBC 3.62 (L) 06/28/2023 1122   RBC 3.83 (L) 09/12/2021 2312   HGB 12.1 06/28/2023 1122   HGB 10.6 06/19/2014 0000   HCT 36.2 06/28/2023 1122   PLT 265 06/28/2023 1122   MCV 100 (H) 06/28/2023 1122   MCH 33.4 (H) 06/28/2023 1122   MCH 33.7 09/12/2021 2312   MCHC 33.4 06/28/2023 1122   MCHC 34.6 09/12/2021 2312   RDW 12.1 06/28/2023 1122   LYMPHSABS 3.8 09/12/2021 2312   LYMPHSABS 2.3 02/28/2018 1545   MONOABS 0.8 09/12/2021 2312   EOSABS 0.2 09/12/2021 2312   EOSABS 0.2 02/28/2018 1545   BASOSABS 0.1 09/12/2021 2312   BASOSABS 0.0 02/28/2018 1545    ASSESSMENT AND PLAN: 1. Pap smear for cervical cancer screening (Primary) Advise to keep calendar of menstrual cycles and bring with her on next visit - Cervicovaginal ancillary only - Cytology - PAP    Patient was given the opportunity to ask questions.  Patient verbalized understanding of the plan and was able to repeat key elements of the plan.   This documentation was completed using Paediatric nurse.  Any transcriptional errors are unintentional.  No orders of the defined types were placed in this encounter.    Requested Prescriptions    No prescriptions requested or ordered in this encounter    Return if symptoms worsen or fail to improve.  Barnie Louder, MD, FACP

## 2023-09-12 LAB — CERVICOVAGINAL ANCILLARY ONLY
Bacterial Vaginitis (gardnerella): POSITIVE — AB
Candida Glabrata: NEGATIVE
Candida Vaginitis: POSITIVE — AB
Chlamydia: NEGATIVE
Comment: NEGATIVE
Comment: NEGATIVE
Comment: NEGATIVE
Comment: NEGATIVE
Comment: NEGATIVE
Comment: NORMAL
Neisseria Gonorrhea: NEGATIVE
Trichomonas: NEGATIVE

## 2023-09-13 ENCOUNTER — Ambulatory Visit: Payer: Self-pay | Admitting: Internal Medicine

## 2023-09-13 MED ORDER — FLUCONAZOLE 150 MG PO TABS: 150.0000 mg | ORAL_TABLET | Freq: Every day | ORAL | 0 refills | Status: AC

## 2023-09-13 MED ORDER — METRONIDAZOLE 500 MG PO TABS: 500.0000 mg | ORAL_TABLET | Freq: Two times a day (BID) | ORAL | 0 refills | Status: AC

## 2023-09-18 LAB — CYTOLOGY - PAP
Comment: NEGATIVE
Diagnosis: NEGATIVE
High risk HPV: NEGATIVE

## 2023-10-16 ENCOUNTER — Ambulatory Visit: Admitting: Internal Medicine

## 2023-10-18 ENCOUNTER — Encounter (HOSPITAL_COMMUNITY): Payer: Self-pay

## 2023-10-18 ENCOUNTER — Telehealth (HOSPITAL_COMMUNITY): Payer: Self-pay | Admitting: *Deleted

## 2023-10-18 MED ORDER — ARIPIPRAZOLE 2 MG PO TABS: 2.0000 mg | ORAL_TABLET | Freq: Every day | ORAL | 0 refills | Status: DC

## 2023-10-18 NOTE — Telephone Encounter (Signed)
 Call returned.  Patient reported increased paranoia and feels that people at work talking about her.  She is getting more upset, irritable and angry.  She like something to calm her down during the day.  She is taking all her medication as prescribed.  In the past she had tried hydroxyzine, BuSpar , gabapentin  which were ineffective.  Discussed to trial low-dose Abilify  to help the paranoia during the daytime.  If patient feel it is working then she can take 2 mg twice a day and keep the Seroquel .  We will slowly titrate the Abilify  in the future and cut down the Seroquel .  Patient has appointment coming up in October 21.  Recommend to keep that appointment.  New prescription of Abilify  called to the pharmacy.
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
## 2023-11-05 ENCOUNTER — Telehealth (HOSPITAL_COMMUNITY): Payer: Self-pay | Admitting: *Deleted

## 2023-11-05 NOTE — Telephone Encounter (Signed)
 Please provide and fill the standard FMLA.  Thank you

## 2023-11-05 NOTE — Telephone Encounter (Signed)
 Pt is requesting FMLA.   Last visit: 09/04/23 Next visit: 12/04/23

## 2023-11-09 ENCOUNTER — Other Ambulatory Visit (HOSPITAL_COMMUNITY): Payer: Self-pay | Admitting: Psychiatry

## 2023-11-16 ENCOUNTER — Encounter (HOSPITAL_COMMUNITY): Payer: Self-pay | Admitting: *Deleted

## 2023-11-16 ENCOUNTER — Telehealth (HOSPITAL_COMMUNITY): Payer: Self-pay | Admitting: *Deleted

## 2023-11-16 ENCOUNTER — Other Ambulatory Visit (HOSPITAL_COMMUNITY): Payer: Self-pay | Admitting: *Deleted

## 2023-11-16 MED ORDER — ARIPIPRAZOLE 2 MG PO TABS: 2.0000 mg | ORAL_TABLET | Freq: Two times a day (BID) | ORAL | 0 refills | Status: DC

## 2023-11-16 NOTE — Telephone Encounter (Signed)
 Will do. Thanks.

## 2023-11-16 NOTE — Telephone Encounter (Signed)
 Yes and if insurance do not approve 2 mg twice a day then she can take 5 mg and take half tablet twice a day.

## 2023-11-16 NOTE — Telephone Encounter (Signed)
 Pt called requesting refill of the Abilify  2 mg which was started on trial basis due to increase paranoia on 10/18/23 with plan to increase to titrate up to 2 mg BID. Ok to send Rx for the BID dose? Pt has an upcoming appointment on 12/04/23. Please review.

## 2023-12-04 ENCOUNTER — Encounter (HOSPITAL_COMMUNITY): Payer: Self-pay | Admitting: Psychiatry

## 2023-12-04 ENCOUNTER — Telehealth (HOSPITAL_COMMUNITY): Admitting: Psychiatry

## 2023-12-04 ENCOUNTER — Encounter (HOSPITAL_COMMUNITY): Payer: Self-pay | Admitting: *Deleted

## 2023-12-04 VITALS — Wt 118.0 lb

## 2023-12-04 DIAGNOSIS — F319 Bipolar disorder, unspecified: Secondary | ICD-10-CM | POA: Diagnosis not present

## 2023-12-04 DIAGNOSIS — F411 Generalized anxiety disorder: Secondary | ICD-10-CM

## 2023-12-04 DIAGNOSIS — F4310 Post-traumatic stress disorder, unspecified: Secondary | ICD-10-CM | POA: Diagnosis not present

## 2023-12-04 MED ORDER — ARIPIPRAZOLE 2 MG PO TABS: 2.0000 mg | ORAL_TABLET | Freq: Two times a day (BID) | ORAL | 2 refills | Status: AC

## 2023-12-04 MED ORDER — LAMOTRIGINE 150 MG PO TABS: 300.0000 mg | ORAL_TABLET | Freq: Every day | ORAL | 2 refills | Status: AC

## 2023-12-04 MED ORDER — SERTRALINE HCL 50 MG PO TABS: 150.0000 mg | ORAL_TABLET | Freq: Every day | ORAL | 2 refills | Status: DC

## 2023-12-04 MED ORDER — QUETIAPINE FUMARATE 400 MG PO TABS: 400.0000 mg | ORAL_TABLET | Freq: Every day | ORAL | 2 refills | Status: DC
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
## 2024-01-17 ENCOUNTER — Other Ambulatory Visit (HOSPITAL_COMMUNITY): Payer: Self-pay | Admitting: Psychiatry

## 2024-01-17 DIAGNOSIS — F319 Bipolar disorder, unspecified: Secondary | ICD-10-CM

## 2024-01-17 DIAGNOSIS — F4310 Post-traumatic stress disorder, unspecified: Secondary | ICD-10-CM

## 2024-01-19 ENCOUNTER — Other Ambulatory Visit (HOSPITAL_COMMUNITY): Payer: Self-pay | Admitting: Psychiatry

## 2024-03-04 ENCOUNTER — Encounter (HOSPITAL_COMMUNITY): Payer: Self-pay | Admitting: Psychiatry

## 2024-03-04 ENCOUNTER — Telehealth (HOSPITAL_COMMUNITY): Admitting: Psychiatry

## 2024-03-04 VITALS — Wt 118.0 lb

## 2024-03-04 DIAGNOSIS — F4310 Post-traumatic stress disorder, unspecified: Secondary | ICD-10-CM | POA: Diagnosis not present

## 2024-03-04 DIAGNOSIS — F411 Generalized anxiety disorder: Secondary | ICD-10-CM

## 2024-03-04 DIAGNOSIS — F319 Bipolar disorder, unspecified: Secondary | ICD-10-CM

## 2024-03-04 MED ORDER — QUETIAPINE FUMARATE 400 MG PO TABS: 400.0000 mg | ORAL_TABLET | Freq: Every day | ORAL | 0 refills | Status: AC

## 2024-03-04 MED ORDER — SERTRALINE HCL 50 MG PO TABS: 150.0000 mg | ORAL_TABLET | Freq: Every day | ORAL | 0 refills | Status: AC

## 2024-03-04 MED ORDER — LITHIUM CARBONATE 150 MG PO CAPS: 150.0000 mg | ORAL_CAPSULE | Freq: Two times a day (BID) | ORAL | 0 refills | Status: AC
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
## 2024-04-01 ENCOUNTER — Telehealth (HOSPITAL_COMMUNITY): Admitting: Psychiatry
